# Patient Record
Sex: Female | Born: 1935 | Race: White | Hispanic: No | Marital: Married | Smoking: Former smoker
Health system: Southern US, Community
[De-identification: ages and names within clinical notes are randomized; demographics above are authoritative.]

## PROBLEM LIST (undated history)

## (undated) DIAGNOSIS — T8859XA Other complications of anesthesia, initial encounter: Secondary | ICD-10-CM

## (undated) DIAGNOSIS — I209 Angina pectoris, unspecified: Secondary | ICD-10-CM

## (undated) DIAGNOSIS — M1990 Unspecified osteoarthritis, unspecified site: Secondary | ICD-10-CM

## (undated) DIAGNOSIS — Z8719 Personal history of other diseases of the digestive system: Secondary | ICD-10-CM

## (undated) DIAGNOSIS — R011 Cardiac murmur, unspecified: Secondary | ICD-10-CM

## (undated) DIAGNOSIS — E785 Hyperlipidemia, unspecified: Secondary | ICD-10-CM

## (undated) DIAGNOSIS — Q630 Accessory kidney: Secondary | ICD-10-CM

## (undated) DIAGNOSIS — R0989 Other specified symptoms and signs involving the circulatory and respiratory systems: Secondary | ICD-10-CM

## (undated) DIAGNOSIS — T4145XA Adverse effect of unspecified anesthetic, initial encounter: Secondary | ICD-10-CM

## (undated) DIAGNOSIS — H3530 Unspecified macular degeneration: Secondary | ICD-10-CM

## (undated) DIAGNOSIS — I4891 Unspecified atrial fibrillation: Secondary | ICD-10-CM

## (undated) DIAGNOSIS — K219 Gastro-esophageal reflux disease without esophagitis: Secondary | ICD-10-CM

## (undated) DIAGNOSIS — I499 Cardiac arrhythmia, unspecified: Secondary | ICD-10-CM

## (undated) DIAGNOSIS — I10 Essential (primary) hypertension: Secondary | ICD-10-CM

## (undated) HISTORY — DX: Hyperlipidemia, unspecified: E78.5

## (undated) HISTORY — DX: Unspecified atrial fibrillation: I48.91

## (undated) HISTORY — PX: KNEE SURGERY: SHX244

## (undated) HISTORY — DX: Unspecified osteoarthritis, unspecified site: M19.90

## (undated) HISTORY — PX: CARDIOVASCULAR STRESS TEST: SHX262

## (undated) HISTORY — PX: APPENDECTOMY: SHX54

## (undated) HISTORY — PX: EYE SURGERY: SHX253

## (undated) HISTORY — DX: Unspecified macular degeneration: H35.30

## (undated) HISTORY — PX: TONSILLECTOMY: SUR1361

## (undated) HISTORY — DX: Essential (primary) hypertension: I10

## (undated) HISTORY — DX: Other specified symptoms and signs involving the circulatory and respiratory systems: R09.89

## (undated) HISTORY — DX: Gastro-esophageal reflux disease without esophagitis: K21.9

## (undated) HISTORY — PX: SPINE SURGERY: SHX786

## (undated) HISTORY — PX: CATARACT EXTRACTION W/ INTRAOCULAR LENS  IMPLANT, BILATERAL: SHX1307

## (undated) HISTORY — DX: Accessory kidney: Q63.0

## (undated) HISTORY — PX: HEMORRHOID SURGERY: SHX153

---

## 1972-01-18 HISTORY — PX: ABDOMINAL HYSTERECTOMY: SHX81

## 1997-06-18 ENCOUNTER — Other Ambulatory Visit: Admission: RE | Admit: 1997-06-18 | Discharge: 1997-06-18 | Payer: Self-pay | Admitting: *Deleted

## 1997-10-15 ENCOUNTER — Encounter: Payer: Self-pay | Admitting: Specialist

## 1997-10-15 ENCOUNTER — Observation Stay (HOSPITAL_COMMUNITY): Admission: RE | Admit: 1997-10-15 | Discharge: 1997-10-16 | Payer: Self-pay | Admitting: Specialist

## 1999-05-05 ENCOUNTER — Ambulatory Visit (HOSPITAL_COMMUNITY): Admission: RE | Admit: 1999-05-05 | Discharge: 1999-05-05 | Payer: Self-pay | Admitting: Gastroenterology

## 1999-05-05 ENCOUNTER — Encounter (INDEPENDENT_AMBULATORY_CARE_PROVIDER_SITE_OTHER): Payer: Self-pay | Admitting: *Deleted

## 2000-01-05 ENCOUNTER — Emergency Department (HOSPITAL_COMMUNITY): Admission: EM | Admit: 2000-01-05 | Discharge: 2000-01-05 | Payer: Self-pay | Admitting: Emergency Medicine

## 2000-01-05 ENCOUNTER — Encounter: Payer: Self-pay | Admitting: Internal Medicine

## 2004-06-02 ENCOUNTER — Ambulatory Visit (HOSPITAL_COMMUNITY): Admission: RE | Admit: 2004-06-02 | Discharge: 2004-06-02 | Payer: Self-pay | Admitting: Gastroenterology

## 2004-08-30 ENCOUNTER — Other Ambulatory Visit: Admission: RE | Admit: 2004-08-30 | Discharge: 2004-08-30 | Payer: Self-pay | Admitting: Family Medicine

## 2005-03-12 ENCOUNTER — Emergency Department (HOSPITAL_COMMUNITY): Admission: EM | Admit: 2005-03-12 | Discharge: 2005-03-12 | Payer: Self-pay | Admitting: Emergency Medicine

## 2005-03-25 ENCOUNTER — Encounter: Admission: RE | Admit: 2005-03-25 | Discharge: 2005-03-25 | Payer: Self-pay | Admitting: Orthopedic Surgery

## 2006-09-06 ENCOUNTER — Encounter: Admission: RE | Admit: 2006-09-06 | Discharge: 2006-09-06 | Payer: Self-pay | Admitting: Orthopedic Surgery

## 2007-01-30 ENCOUNTER — Encounter: Admission: RE | Admit: 2007-01-30 | Discharge: 2007-01-30 | Payer: Self-pay | Admitting: Specialist

## 2007-04-17 ENCOUNTER — Encounter: Admission: RE | Admit: 2007-04-17 | Discharge: 2007-05-15 | Payer: Self-pay | Admitting: Ophthalmology

## 2008-03-12 ENCOUNTER — Inpatient Hospital Stay (HOSPITAL_COMMUNITY): Admission: RE | Admit: 2008-03-12 | Discharge: 2008-03-15 | Payer: Self-pay | Admitting: Orthopedic Surgery

## 2008-03-24 ENCOUNTER — Inpatient Hospital Stay (HOSPITAL_COMMUNITY): Admission: AD | Admit: 2008-03-24 | Discharge: 2008-03-30 | Payer: Self-pay | Admitting: Orthopedic Surgery

## 2008-03-26 ENCOUNTER — Encounter (INDEPENDENT_AMBULATORY_CARE_PROVIDER_SITE_OTHER): Payer: Self-pay | Admitting: Orthopedic Surgery

## 2010-01-17 HISTORY — PX: JOINT REPLACEMENT: SHX530

## 2010-02-08 LAB — COMPREHENSIVE METABOLIC PANEL
ALT: 11 U/L (ref 0–35)
AST: 17 U/L (ref 0–37)
Albumin: 3.7 g/dL (ref 3.5–5.2)
Alkaline Phosphatase: 54 U/L (ref 39–117)
BUN: 14 mg/dL (ref 6–23)
CO2: 29 mEq/L (ref 19–32)
Calcium: 9.4 mg/dL (ref 8.4–10.5)
Chloride: 102 mEq/L (ref 96–112)
Creatinine, Ser: 0.94 mg/dL (ref 0.4–1.2)
GFR calc Af Amer: >60 mL/min (ref >60)
GFR calc non Af Amer: 57 mL/min — ABNORMAL LOW (ref >60)
Glucose, Bld: 84 mg/dL (ref 70–99)
Potassium: 4.4 mEq/L (ref 3.5–5.1)
Sodium: 139 mEq/L (ref 135–145)
Total Bilirubin: 0.7 mg/dL (ref 0.3–1.2)
Total Protein: 7 g/dL (ref 6.0–8.3)

## 2010-02-08 LAB — CBC
HCT: 40.6 % (ref 36.0–46.0)
Hemoglobin: 13.1 g/dL (ref 12.0–15.0)
MCH: 30.1 pg (ref 26.0–34.0)
MCHC: 32.3 g/dL (ref 30.0–36.0)
MCV: 93.3 fL (ref 78.0–100.0)
Platelets: 257 10*3/uL (ref 150–400)
RBC: 4.35 MIL/uL (ref 3.87–5.11)
RDW: 15 % (ref 11.5–15.5)
WBC: 6.9 10*3/uL (ref 4.0–10.5)

## 2010-02-08 LAB — PROTIME-INR
INR: 0.98 (ref 0.00–1.49)
Prothrombin Time: 13.2 seconds (ref 11.6–15.2)

## 2010-02-08 LAB — SURGICAL PCR SCREEN
MRSA, PCR: NEGATIVE
Staphylococcus aureus: NEGATIVE

## 2010-02-08 LAB — URINALYSIS, ROUTINE W REFLEX MICROSCOPIC
Bilirubin Urine: NEGATIVE
Hgb urine dipstick: NEGATIVE
Ketones, ur: NEGATIVE mg/dL
Nitrite: NEGATIVE
Protein, ur: NEGATIVE mg/dL
Specific Gravity, Urine: 1.011 (ref 1.005–1.030)
Urine Glucose, Fasting: NEGATIVE mg/dL
Urobilinogen, UA: 0.2 mg/dL (ref 0.0–1.0)
pH: 6.5 (ref 5.0–8.0)

## 2010-02-08 LAB — URINE MICROSCOPIC-ADD ON

## 2010-02-08 LAB — APTT: aPTT: 33 seconds (ref 24–37)

## 2010-02-15 ENCOUNTER — Inpatient Hospital Stay (HOSPITAL_COMMUNITY)
Admission: RE | Admit: 2010-02-15 | Discharge: 2010-02-18 | DRG: 470 | Disposition: A | Discharge status: Skilled Nursing Facility | Payer: MEDICARE | Attending: Orthopedic Surgery | Admitting: Orthopedic Surgery

## 2010-02-15 DIAGNOSIS — K449 Diaphragmatic hernia without obstruction or gangrene: Secondary | ICD-10-CM | POA: Diagnosis present

## 2010-02-15 DIAGNOSIS — F17200 Nicotine dependence, unspecified, uncomplicated: Secondary | ICD-10-CM | POA: Diagnosis present

## 2010-02-15 DIAGNOSIS — D62 Acute posthemorrhagic anemia: Secondary | ICD-10-CM | POA: Diagnosis not present

## 2010-02-15 DIAGNOSIS — G43909 Migraine, unspecified, not intractable, without status migrainosus: Secondary | ICD-10-CM | POA: Diagnosis present

## 2010-02-15 DIAGNOSIS — H3530 Unspecified macular degeneration: Secondary | ICD-10-CM | POA: Diagnosis present

## 2010-02-15 DIAGNOSIS — I10 Essential (primary) hypertension: Secondary | ICD-10-CM | POA: Diagnosis present

## 2010-02-15 DIAGNOSIS — H539 Unspecified visual disturbance: Secondary | ICD-10-CM | POA: Diagnosis present

## 2010-02-15 DIAGNOSIS — IMO0002 Reserved for concepts with insufficient information to code with codable children: Secondary | ICD-10-CM | POA: Diagnosis present

## 2010-02-15 DIAGNOSIS — E785 Hyperlipidemia, unspecified: Secondary | ICD-10-CM | POA: Diagnosis present

## 2010-02-15 DIAGNOSIS — M1710 Unilateral primary osteoarthritis, unspecified knee: Principal | ICD-10-CM | POA: Diagnosis present

## 2010-02-15 DIAGNOSIS — E876 Hypokalemia: Secondary | ICD-10-CM | POA: Diagnosis not present

## 2010-02-15 LAB — TYPE AND SCREEN
ABO/RH(D): O POS
Antibody Screen: NEGATIVE

## 2010-02-15 LAB — ABO/RH: ABO/RH(D): O POS

## 2010-02-16 LAB — BASIC METABOLIC PANEL
BUN: 11 mg/dL (ref 6–23)
CO2: 30 mEq/L (ref 19–32)
Calcium: 8.3 mg/dL — ABNORMAL LOW (ref 8.4–10.5)
Chloride: 103 mEq/L (ref 96–112)
Creatinine, Ser: 0.88 mg/dL (ref 0.4–1.2)
GFR calc Af Amer: >60 mL/min (ref >60)
GFR calc non Af Amer: >60 mL/min (ref >60)
Glucose, Bld: 130 mg/dL — ABNORMAL HIGH (ref 70–99)
Potassium: 4.4 mEq/L (ref 3.5–5.1)
Sodium: 138 mEq/L (ref 135–145)

## 2010-02-16 LAB — CBC
HCT: 30.4 % — ABNORMAL LOW (ref 36.0–46.0)
Hemoglobin: 9.8 g/dL — ABNORMAL LOW (ref 12.0–15.0)
MCH: 30.2 pg (ref 26.0–34.0)
MCHC: 32.2 g/dL (ref 30.0–36.0)
MCV: 93.8 fL (ref 78.0–100.0)
Platelets: 199 10*3/uL (ref 150–400)
RBC: 3.24 MIL/uL — ABNORMAL LOW (ref 3.87–5.11)
RDW: 14.9 % (ref 11.5–15.5)
WBC: 7 10*3/uL (ref 4.0–10.5)

## 2010-02-16 LAB — PROTIME-INR
INR: 1.13 (ref 0.00–1.49)
Prothrombin Time: 14.7 seconds (ref 11.6–15.2)

## 2010-02-16 NOTE — H&P (Addendum)
Vicki Hernandez, Vicki Hernandez            ACCOUNT NO.:  [id number]  MEDICAL RECORD NO.:  [id number]          PATIENT TYPE:  INP  LOCATION:  0099                         FACILITY:  [facility]  PHYSICIAN:  Ollen Gross, M.D.    DATE OF BIRTH:  06/08/1928  DATE OF ADMISSION:  02/15/2010 DATE OF DISCHARGE:                             HISTORY & PHYSICAL   CHIEF COMPLAINT:  Left knee pain.  BRIEF HISTORY:  Vicki Hernandez was referred to Dr. Lequita Halt by Dr. Shon Baton. She has been having trouble with the left knee for several years now and it has gotten much worse over the past year.  She has been seeing her rheumatologist, Dr. Corliss Skains and getting cortisone injections. Unfortunately, these injections are helping less and less.  She now is feeling as though the knee is going to give out on her.  It is decreasing her ability to complete daily activities.  It is limiting what she is able to do.  She now presents for left total knee arthroplasty.  Vicki Hernandez has been cleared for surgery by her primary care physician, Dr. Ancil Boozer.  MEDICATION ALLERGIES: 1. SULFA.  This causes hives. 2. CODEINE, more of a sensitivity.  This causes nausea.  She is     however able to tolerate Percocet and Vicodin.  She denies any allergies to food, latex or metal.  CURRENT MEDICATIONS: 1. Micardis. 2. Nexium. 3. Simvastatin. 4. Vitamin D. 5. Senna 6. Stool softener. 7. Tramadol.  PAST MEDICAL HISTORY: 1. End-stage arthritis of the left knee. 2. History of migraines, has not had one in several years. 3. Impaired vision. 4. Macular degeneration. 5. Cataracts. 6. Hypertension. 7. Heart murmur. 8. Hyperlipidemia. 9. Heart arrhythmia. 10.Hiatal hernia. 11.Hemorrhoids, denies blood in the stool. 12.History of fractures. 13.Arthritis. 14.Degenerative disk disease. 15.History of measles as a child. 16.History of mumps as a child. 17.History of rubella as a child.  PAST SURGICAL HISTORY: 1. Back surgery in  2011. 2. Tonsillectomy in 1936. 3. Eye surgery in 1938. 4. Appendectomy in 1941. 5. Complete hysterectomy in 1974. 6. Three breast cysts removed, unsure of the year. 7. Hemorrhoidectomy x3. 8. Cataract surgery x2.  FAMILY MEDICAL HISTORY:  Father passed at the age of 60.  He had coronary artery disease.  Mother passed at the age of 49 from a stroke.  SOCIAL HISTORY:  The patient is married.  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  She admits current use of tobacco products.  She has smoked half a pack a day for many years.  She admits occasional glass of wine.  She has two children, lives at home with her husband and she does plan to go home following her hospital stay.  Her daughter and her husband are lined up to be her caregiver.  REVIEW OF SYSTEMS:  GENERAL:  Positive for weight change.  Negative for night sweats or loss of memory.  HEENT/NEURO:  Positive for blurred vision.  Negative for insomnia or balance problems.  DERMATOLOGIC: Positive for occasional hives.  Negative for itching or lesion. RESPIRATORY:  Negative for shortness of breath at rest or with exertion. CARDIOVASCULAR:  Positive for occasional  palpitations.  Negative for chest pain.  GI:  Positive for constipation.  Negative for nausea, vomiting, diarrhea.  GU:  Negative for hematuria, dysuria. MUSCULOSKELETAL:  Positive for joint pain, joint swelling, back pain and morning stiffness.  PHYSICAL EXAMINATION:  VITAL SIGNS:  Pulse 88, respirations 18, blood pressure 118/76 in the left arm. GENERAL:  Ms. Speak is alert and oriented x3.  She is well developed, well nourished, in no apparent distress.  She is accompanied today by her husband. HEENT:  Normocephalic, atraumatic.  Extraocular movements intact.  The patient is wearing glasses. NECK:  Supple.  Full range of motion without lymphadenopathy. CHEST:  Lungs are clear to auscultation bilaterally without wheezes, rhonchi or rales. HEART:  Regular  rate and rhythm.  She does have a systolic murmur. ABDOMEN:  Bowel sounds present in all four quadrants.  Abdomen is soft, nontender, nondistended to palpation. EXTREMITIES:  Left knee 5 to 125 degrees.  Marked crepitus noted throughout the range of motion.  She has tenderness with palpation over the medial aspect of the left knee. SKIN:  She has a skin tear of the medial aspect of the right lower leg. No signs of infection, however. NEUROLOGIC:  Intact peripheral vascular, mild carotid bruit noted on the left.  RADIOGRAPHS:  AP and lateral views of the left knee reveal end-stage arthritis in the medial and patellofemoral compartments.  IMPRESSION:  End-stage arthritis of the left knee.  PLAN:  Left total knee arthroplasty to be performed by Dr. Lequita Halt.     Rozell Searing, PAC   ______________________________ Ollen Gross, M.D.    LD/MEDQ  D:  02/15/2010  T:  02/15/2010  Job:  [id number]  cc:   Dr. Ancil Boozer  Electronically Signed by Rozell Searing  on 02/16/2010 93:81:82 PM Electronically Signed by Ollen Gross M.D. on 02/17/2010 03:40:53 PM

## 2010-02-17 LAB — CBC
HCT: 28.1 % — ABNORMAL LOW (ref 36.0–46.0)
Hemoglobin: 9.4 g/dL — ABNORMAL LOW (ref 12.0–15.0)
MCH: 31 pg (ref 26.0–34.0)
MCHC: 33.5 g/dL (ref 30.0–36.0)
MCV: 92.7 fL (ref 78.0–100.0)
Platelets: 167 10*3/uL (ref 150–400)
RBC: 3.03 MIL/uL — ABNORMAL LOW (ref 3.87–5.11)
RDW: 14.8 % (ref 11.5–15.5)
WBC: 9.1 10*3/uL (ref 4.0–10.5)

## 2010-02-17 LAB — PROTIME-INR
INR: 1.43 (ref 0.00–1.49)
Prothrombin Time: 17.6 seconds — ABNORMAL HIGH (ref 11.6–15.2)

## 2010-02-17 LAB — BASIC METABOLIC PANEL
BUN: 7 mg/dL (ref 6–23)
CO2: 28 mEq/L (ref 19–32)
Calcium: 8.6 mg/dL (ref 8.4–10.5)
Chloride: 104 mEq/L (ref 96–112)
Creatinine, Ser: 0.9 mg/dL (ref 0.4–1.2)
GFR calc Af Amer: >60 mL/min (ref >60)
GFR calc non Af Amer: >60 mL/min (ref >60)
Glucose, Bld: 158 mg/dL — ABNORMAL HIGH (ref 70–99)
Potassium: 3.4 mEq/L — ABNORMAL LOW (ref 3.5–5.1)
Sodium: 139 mEq/L (ref 135–145)

## 2010-02-17 NOTE — Op Note (Signed)
NAMEDARNETTE, LAMPRON            ACCOUNT NO.:  [id number]  MEDICAL RECORD NO.:  [id number]          PATIENT TYPE:  INP  LOCATION:  0099                         FACILITY:  [facility]  PHYSICIAN:  Ollen Gross, M.D.    DATE OF BIRTH:  May 28, 1928  DATE OF PROCEDURE:  02/15/2010 DATE OF DISCHARGE:                              OPERATIVE REPORT   PREOPERATIVE DIAGNOSIS:  Osteoarthritis of the left knee.  POSTOPERATIVE DIAGNOSIS:  Osteoarthritis of the left knee.  PROCEDURE:  Left total knee arthroplasty.  SURGEON:  Ollen Gross, M.D.  ASSISTANT:  [name], P.A.C.  ANESTHESIA:  General.  ESTIMATED BLOOD LOSS:  Minimal.  DRAINS:  Hemovac x1.  TOURNIQUET TIME:  29 minutes at 300 mmHg.  COMPLICATIONS:  None.  CONDITION:  Stable to recovery.  CLINICAL NOTE:  Ms. Glassberg is a 77 year old man female, who has advanced arthritis of the left knee with progressively worsening pain and dysfunction.  She has failed nonoperative management and presents now for a left total knee arthroplasty.  PROCEDURE IN DETAIL:  After successful administration of general anesthetic, a tourniquet is placed on her left thigh and the left lower extremity was prepped and draped in the usual sterile fashion. Extremities were wrapped in Esmarch, knee flexed, tourniquet inflated to 300 mmHg.  Midline incision was made with 10 blade through subcutaneous tissue to the level of extensor mechanism.  Fresh blade was used to make a medial parapatellar arthrotomy.  Soft tissue on the proximal medial tibia was subperiosteally elevated to the joint line with the knife into the semimembranosus bursa with a Cobb elevator.  Soft tissue laterally was elevated with attention being paid to avoiding the patellar tendon on tibial tubercle.  Patella was everted and knee flexed to 90 degrees, ACL and PCL removed.  Drill was used to create a starting hole in the distal femur.  The canal was thoroughly irrigated.   Five degrees left valgus alignment guide is placed.  The distal femoral cutting block is pinned to remove 10 mm off the distal femur.  Distal femoral resection was made with an oscillating saw.  The tibia subluxed forward and the menisci removed.  Extramedullary tibial alignment guide is placed referencing proximally at the medial aspect of the tibial tubercle and distally along the second metatarsal axis and tibial crest.  Block is pinned to remove 2 mm off the more deficient medial side.  Tibial resection is made with an oscillating saw.  Size 2.5 is most appropriate and a proximal tibia was prepared with modular drill and keel punched for the size 2.5.  Femoral sizing guide was placed and size 2.5 was most appropriate on the femur.  Rotations were marked at the epicondylar axis confirmed by creating rectangular flexion gap at 90 degrees.  Size 2.5 cutting block is placed in this rotation and the anterior, posterior and chamfer cuts were made.  Intercondylar block was placed and that cut was made.  Trial 2.5 posterior stabilized femur was placed.  The 10 mm posterior stabilized rotating platform insert trial was placed.  There was a little bit of play with the tensor when the 12 fibers were  allowed for full extension with excellent varus-valgus and anterior-posterior balance throughout with full range of motion.  Patella was everted and the thickness measured to be 23 mm.  Freehand resection was taken to 14 mm, 35 template was placed, lug holes were drilled, trial patella was placed and it tracks normally.  Osteophytes were removed off the posterior femur with the trial in place.  All trials were removed and cut bone surfaces, pulsatile lavaged, cements mixed and once ready for implantation, the size 2.5 mobile bearing tibia, 2.5 posterior stabilized femur and 35 patella are cemented into place.  Patella was held with the clamp.  Trial 12.5 inserts were placed and the knee was held  in full extension.  All extruded cement removed.  When cement fully hardened and permanent, 12.5 mm posterior stabilized rotating platform insert was placed in tibial tray and was copiously irrigated with saline solution and the arthrotomy closed over with Hemovac drain with interrupted #1 PDS.  Flexion against gravity to 135 degrees.  Patella tracks normally.  The tourniquet was released with total time of 29 minutes.  Subcutaneous was closed with interrupted 2-0 Vicryl subcuticular running 4-0 Monocryl.  Incisions were cleaned and dried and Steri-Strips and bulky sterile dressing were applied.  Drains hooked to suction.  Prior placing the dressing, the catheter for Marcaine pain pump was placed and the pump was initiated.  She was then placed into a knee immobilizer, awakened and transferred to recovery in stable condition.     Ollen Gross, M.D.     FA/MEDQ  D:  02/15/2010  T:  02/15/2010  Job:  [id number]  Electronically Signed by Ollen Gross M.D. on 02/17/2010 03:40:59 PM

## 2010-02-18 LAB — BASIC METABOLIC PANEL
BUN: 10 mg/dL (ref 6–23)
CO2: 29 mEq/L (ref 19–32)
Calcium: 8.9 mg/dL (ref 8.4–10.5)
Chloride: 105 mEq/L (ref 96–112)
Creatinine, Ser: 1.04 mg/dL (ref 0.4–1.2)
GFR calc Af Amer: >60 mL/min (ref >60)
GFR calc non Af Amer: 51 mL/min — ABNORMAL LOW (ref >60)
Glucose, Bld: 109 mg/dL — ABNORMAL HIGH (ref 70–99)
Potassium: 4.5 mEq/L (ref 3.5–5.1)
Sodium: 140 mEq/L (ref 135–145)

## 2010-02-18 LAB — CBC
HCT: 28 % — ABNORMAL LOW (ref 36.0–46.0)
Hemoglobin: 9 g/dL — ABNORMAL LOW (ref 12.0–15.0)
MCH: 29.9 pg (ref 26.0–34.0)
MCHC: 32.1 g/dL (ref 30.0–36.0)
MCV: 93 fL (ref 78.0–100.0)
Platelets: 187 10*3/uL (ref 150–400)
RBC: 3.01 MIL/uL — ABNORMAL LOW (ref 3.87–5.11)
RDW: 14.8 % (ref 11.5–15.5)
WBC: 7.2 10*3/uL (ref 4.0–10.5)

## 2010-02-18 LAB — PROTIME-INR
INR: 1.68 — ABNORMAL HIGH (ref 0.00–1.49)
Prothrombin Time: 20 seconds — ABNORMAL HIGH (ref 11.6–15.2)

## 2010-03-25 NOTE — Discharge Summary (Signed)
Vicki Hernandez, Vicki Hernandez            ACCOUNT NO.:  [id number]  MEDICAL RECORD NO.:  [id number]          Hernandez TYPE:  INP  LOCATION:  1620                         FACILITY:  [facility]  PHYSICIAN:  Ollen Gross, M.D.    DATE OF BIRTH:  [birth year]  DATE OF ADMISSION:  02/15/2010 DATE OF DISCHARGE:  02/18/2010                        DISCHARGE SUMMARY - REFERRING   PRIMARY CARE PHYSICIAN:  [name], MD  Priority summary will go with Vicki Hernandez over to [facility] today.  ADMITTING DIAGNOSES: 1. Osteoarthritis of left knee. 2. History of migraines. 3. Impaired vision 4. Macular degeneration. 5. History of cataracts. 6. Hypertension. 7. Heart murmur. 8. Hyperlipidemia. 9. Heart arrhythmia. 10.Hiatal hernia. 11.Hemorrhoids. 12.Degenerative disk disease. 13.Childhood illnesses of measles, mumps, rubella.  DISCHARGE DIAGNOSES: 1. Osteoarthritis of left knee, status post left total knee     replacement arthroplasty. 2. Mild postoperative hypokalemia, improved. 3. Postoperative acute blood loss anemia, did not require transfusion. 4. History of migraines. 5. Impaired vision 6. Macular degeneration. 7. History of cataracts. 8. Hypertension. 9. Heart murmur. 10.Hyperlipidemia. 11.Heart arrhythmia. 12.Hiatal hernia. 13.Hemorrhoids. 14.Degenerative disk disease. 15.Childhood illnesses of measles, mumps, rubella.  PROCEDURE:  February 15, 2010, left total knee.  SURGEON:  Ollen Gross, M.D.  ASSISTANT:  [name], P.A.C.  ANESTHESIA:  General.  TOURNIQUET TIME:  29 minutes.  CONSULTS:  None.  BRIEF HISTORY:  Vicki Hernandez is a 77 year old female who has been seen by Dr. Lequita Halt for ongoing progressive end-stage arthritis.  She has undergone injections in Vicki past, failed nonoperative management, and now presents for total knee arthroplasty.  LABORATORY DATA:  CBC on admission; hemoglobin of 13.1, hematocrit of 40.6, white cell count 6.9, platelets  257,000.  PT/INR 13.2/0.98, with PTT of 33.  Chem panel on admission, all within normal limits. Preoperative UA; trace leukocytes, many squamous, 0 to 3 white cells, otherwise negative.  Blood group type O positive.  Nasal swabs were negative for Staphylococcus aureus, negative for MRSA.  Serial CBCs were followed.  Hemoglobin dropped down to 9.8 and 9.4, last hemoglobin and hematocrit was 9.0 and 28.0.  Serial protimes were followed per Coumadin protocol, last PT/INR of 20.0 and 1.68.  Serial BMPs were followed for 3 days.  Potassium dropped from 4.4 to 3.4, back up to 4.5; glucose went up from 84 to 158, back down to 109.  Remaining electrolytes remained within normal limits.  X-rays, two-view chest, on February 05, 2010, stable COPD, no active cardiopulmonary disease.  EKG on February 05, 2010, sinus rhythm with marked sinus arrhythmia, possible left atrial enlargement, abnormal, cannot rule out anterior septal infarct, confirmed by Dr. Armanda Magic.  HOSPITAL COURSE:  Vicki Hernandez was admitted to [facility], taken to Vicki OR, underwent above-stated procedure without complication. Vicki Hernandez tolerated Vicki procedure well, later was transferred to recovery room and floor, started on p.o. and IV analgesics for pain control following Vicki surgery.  Did have some pain on Vicki evening of surgery and Vicki morning of day #1.  She was seen in rounds.  Started back on her home medications.  Hemoglobin was down to 9.8.  She was asymptomatic with this.  She had decent urinary output.  We started back her blood pressure pill on a parameter.  Started getting up out of bed on day #1, actually walked short distances when in Vicki room and short distances in Vicki hallway by day #2; however, she was doing very well, walking over 200 feet.  Pain was under better control.  Hemoglobin was down to 9.4.  Pressure was stable.  Dressings changed, incision looked good.  Put her on a little iron supplement  for Vicki low iron.  Her potassium was down to 3.4, felt to be a dilutional component, so put on a little K-Dur.  She tolerated and responded well by Vicki next day.  Her potassium was back up to 4.5.  She was walking well, progressing, meeting her goals with therapy, seen by Dr. Lequita Halt and decided she be transferred out at that time.  Postoperatively, we got social work involved.  She wanted to look into [facility], bed available and transferred on February 18, 2010.  DISCHARGE/PLAN: 1. Vicki Hernandez will be transferred to [facility] on February 18, 2010. 2. Discharge diagnoses, please see above.  DISCHARGE MEDICATIONS:  As follows: 1. Coumadin protocol.  Please titrate Coumadin level for target INR     between 2.0 and 3.0.  She needs to be on Coumadin for 3 weeks from     Vicki date of surgery. 2. Micardis HCT 40/12.5 one-half tablet every morning. 3. Colace 100 mg p.o. daily. 4. Simvastatin 40 mg p.o. q.a.m. 5. Nexium 40 mg p.o. q.a.m. 6. Tylenol 325 one or two every 4-6 hours as needed for mild pain,     temperature or headache. 7. Robaxin 500 mg p.o. every 6-8 hours p.r.n. spasm. 8. Systane eyedrops to both eyes 1 drop 3 times a day. 9. Percocet 5 mg 1 or 2 every 4-6 hours as needed for moderate pain. 10.Ultram 50 mg 1 or 2 tablets every 6 hours as needed for mild pain.  DIET:  Heart-healthy diet.  ACTIVITY:  She is weightbearing as tolerated, total knee protocol. PT/OT for gait training, ambulation, ADLs, range of motion and strengthening exercises per total knee protocol.  Please note, she may start showering once she is transferred to [facility], however, do not submerge incision under water.  Daily dressing change.  FOLLOWUP:  Vicki Hernandez needs to follow with Dr. Lequita Halt in Vicki office either on Tuesday, Vicki 14th, or Thursday, Vicki 16th.  Please contact Vicki office at [phone] to help arrange appointment and followup care of this Hernandez.  DISPOSITION:  Vicki Hernandez.  CONDITION ON DISCHARGE:  Improved.     [name], P.A.C.   ______________________________ Ollen Gross, M.D.    ALP/MEDQ  D:  02/18/2010  T:  02/18/2010  Job:  [id number]  cc:   [name], MD  Electronically Signed by Patrica Duel P.A.C. on 03/22/2010 10:46:22 AM Electronically Signed by Ollen Gross M.D. on 03/24/2010 03:45:02 PM

## 2010-04-29 LAB — CBC
HCT: 32.2 % — ABNORMAL LOW (ref 36.0–46.0)
HCT: 37.7 % (ref 36.0–46.0)
Hemoglobin: 10.7 g/dL — ABNORMAL LOW (ref 12.0–15.0)
Hemoglobin: 12.7 g/dL (ref 12.0–15.0)
MCHC: 33.2 g/dL (ref 30.0–36.0)
MCHC: 33.7 g/dL (ref 30.0–36.0)
MCV: 92.6 fL (ref 78.0–100.0)
MCV: 94 fL (ref 78.0–100.0)
Platelets: 259 10*3/uL (ref 150–400)
Platelets: 315 10*3/uL (ref 150–400)
RBC: 3.43 MIL/uL — ABNORMAL LOW (ref 3.87–5.11)
RBC: 4.07 MIL/uL (ref 3.87–5.11)
RDW: 14.5 % (ref 11.5–15.5)
RDW: 14.6 % (ref 11.5–15.5)
WBC: 8.3 10*3/uL (ref 4.0–10.5)
WBC: 8.4 10*3/uL (ref 4.0–10.5)

## 2010-04-29 LAB — BASIC METABOLIC PANEL
BUN: 11 mg/dL (ref 6–23)
BUN: 13 mg/dL (ref 6–23)
CO2: 29 mEq/L (ref 19–32)
CO2: 33 mEq/L — ABNORMAL HIGH (ref 19–32)
Calcium: 8.8 mg/dL (ref 8.4–10.5)
Calcium: 9.7 mg/dL (ref 8.4–10.5)
Chloride: 100 mEq/L (ref 96–112)
Chloride: 101 mEq/L (ref 96–112)
Creatinine, Ser: 0.87 mg/dL (ref 0.4–1.2)
Creatinine, Ser: 0.87 mg/dL (ref 0.4–1.2)
GFR calc Af Amer: >60 mL/min (ref >60)
GFR calc Af Amer: >60 mL/min (ref >60)
GFR calc non Af Amer: >60 mL/min (ref >60)
GFR calc non Af Amer: >60 mL/min (ref >60)
Glucose, Bld: 111 mg/dL — ABNORMAL HIGH (ref 70–99)
Glucose, Bld: 122 mg/dL — ABNORMAL HIGH (ref 70–99)
Potassium: 3.9 mEq/L (ref 3.5–5.1)
Potassium: 4.4 mEq/L (ref 3.5–5.1)
Sodium: 137 mEq/L (ref 135–145)
Sodium: 139 mEq/L (ref 135–145)

## 2010-04-29 LAB — URINALYSIS, ROUTINE W REFLEX MICROSCOPIC
Bilirubin Urine: NEGATIVE
Glucose, UA: NEGATIVE mg/dL
Hgb urine dipstick: NEGATIVE
Ketones, ur: NEGATIVE mg/dL
Nitrite: NEGATIVE
Protein, ur: NEGATIVE mg/dL
Specific Gravity, Urine: 1.006 (ref 1.005–1.030)
Urobilinogen, UA: 0.2 mg/dL (ref 0.0–1.0)
pH: 7.5 (ref 5.0–8.0)

## 2010-05-04 LAB — POCT I-STAT, CHEM 8
BUN: 25 mg/dL — ABNORMAL HIGH (ref 6–23)
Calcium, Ion: 1.24 mmol/L (ref 1.12–1.32)
Chloride: 99 mEq/L (ref 96–112)
Creatinine, Ser: 1.1 mg/dL (ref 0.4–1.2)
Glucose, Bld: 89 mg/dL (ref 70–99)
HCT: 37 % (ref 36.0–46.0)
Hemoglobin: 12.6 g/dL (ref 12.0–15.0)
Potassium: 4.4 mEq/L (ref 3.5–5.1)
Sodium: 139 mEq/L (ref 135–145)
TCO2: 36 mmol/L (ref 0–100)

## 2010-06-01 NOTE — Op Note (Signed)
NAMECARMON, BRIGANDI            ACCOUNT NO.:  [id number]   MEDICAL RECORD NO.:  [id number]          PATIENT TYPE:  INP   LOCATION:  1605                         FACILITY[facility]   PHYSICIAN:  Alvy Beal, MD    DATE OF BIRTH:  07-18-1928   DATE OF PROCEDURE:  03/13/2008  DATE OF DISCHARGE:                               OPERATIVE REPORT   PREOPERATIVE DIAGNOSIS:  Left lateral disk herniation causing foraminal  and lateral recess stenosis, L2-3.   POSTOPERATIVE DIAGNOSIS:  Left lateral disk herniation causing foraminal  and lateral recess stenosis, L2-3.   OPERATIVE PROCEDURE:  L2-3 lumbar decompression diskectomy.   COMPLICATIONS:  None.   CONDITION:  Stable.   HISTORY:  This is a very pleasant 77 year old woman who presented to my  office with complaints of severe left L3 and L2 radicular leg pain.  Attempts at conservative management including narcotic pain medications  had failed.  After discussing various treatment options,she elected to  proceed with surgery.   OPERATIVE NOTE:  The patient was brought to the operating room, placed  supine on the operating table.  After successful induction of general  anesthesia endotracheal intubation, TED SCDs were applied and she was  turned prone onto the Wilson frame.  All appropriate bony prominences  were well-padded and the Wilson frame was positioned into kyphotic  position so that the body was in kyphosis.  The back was then prepped  and draped in standard fashion.  A needle was then inserted at the L2-3  level and intraoperative lateral fluoroscopy confirmed that I was at the  L2-3 level.  I then made a small incision about an inch in length and  just off of midline.  I then dissected down to the deep fascia.  The  deep fascia and then passed the various trocars until I was just over  the L2-3 disk space.  I then placed a self retracting tube from [employer]  (maximum access minimally invasive system).  I then locked it to  the  table with the arm and opened it.  At this point I had excellent  visualization of the L2 lamina and L2 pars.  I then developed a plane.  I then confirmed the L2 lamina on x-ray and then used a fine curette to  develop a plane underneath the L2 lamina.  I then performed a generous  laminotomy.  Because of the lateral recess and foraminal extension of  the disk material,  I elected to resect the pars in order to adequately  decompress the exiting L2 nerve root.  Once I had the lateral recess  decompression.  I could clearly visualize the L2-3 disk space.  There  was a fragment of disk material underneath in the L2 foramen and having  resected the pars, I was able to easily remove this fragment.  I then  went to the L2-3 disk space itself, incised the annulus and remove the  rather large posterolateral disk fragment.  There was also significant  osteophytes which I used a reverse angled Epstein curette to remove.  Once I had an adequate diskectomy done,  I was able to freely pass a  [employer] elevator inferiorly along the path of the L3 nerve root and the  lateral recess completely freely out the L2 neural foramen and  superiorly in the lateral recess superior to the L2 nerve root.  I was  also able to sweep it circumferentially along the medial side without  any obstruction.  I then used bipolar electrocautery to obtain  hemostasis and used FloSeal to maintain it.  Once I had adequate  decompression, I confirmed this with palpation with both the nerve hook  and a Public house manager, I removed checked the interbody disk space at L2-  3 with a micro curette to ensure that there was no loose fragments of  disk material.  Once confirmed, I irrigated copiously with normal saline  and then placed a thrombin Gelfoam soaked patty over the lamina defect  site.  I then closed the fascia with interrupted #1 Vicryl sutures,  superficial 2-0 Vicryl sutures and 3-0 nylon for the skin.  Steri-  Strips,  dry dressing were applied.  The patient was extubated and  transferred to PACU without incident.  At the end of the case, all  needle and sponge counts were correct.  The patient tolerated procedure  well.  No adverse intraoperative complications.      Alvy Beal, MD  Electronically Signed     DDB/MEDQ  D:  03/13/2008  T:  03/14/2008  Job:  [id number]

## 2010-06-01 NOTE — Op Note (Signed)
NAMEAIDEE, LATIMORE            ACCOUNT NO.:  [id number]   MEDICAL RECORD NO.:  [id number]          PATIENT TYPE:  INP   LOCATION:  1604                         FACILITY:  [facility]   PHYSICIAN:  Alvy Beal, MD    DATE OF BIRTH:  03/15/1928   DATE OF PROCEDURE:  DATE OF DISCHARGE:                               OPERATIVE REPORT   PREOPERATIVE DIAGNOSIS:  Recurrent disk herniation/hematoma from  previous left L2-3 diskectomy.   POSTOPERATIVE DIAGNOSIS:  Recurrent disk herniation/hematoma from  previous left L2-3 diskectomy.   OPERATIVE PROCEDURE:  Revision of lumbar diskectomy and excision and  evacuation of hematoma.   COMPLICATIONS:  None.   CONDITION:  Stable.   HISTORY:  This is a very pleasant 77 year old who 2 weeks ago underwent  a left L2-3 lumbar laminectomy for diskectomy for radicular L3 nerve  pain and disk herniation.  The patient's postoperative course was  essentially unremarkable.  She was ultimately discharged and the next  day following discharge, had significant the recurrent thigh pain.  This  is the same pain she had preoperatively.  Attempts at conservative  management consisting of pain medications and Medrol Dosepak have failed  to alleviate her symptoms.  Approximately Monday 2 weeks postoperative,  she was readmitted to the hospital for repeat MRI.  She was placed on  the appropriate pain medications and was made comfortable.  The repeat  MRI showed hematoma and recurrent disk herniation.  A second opinion was  requested by the family and provided.  This was done by Dr. Danielle Dess.  My  plan was to do a revision decompression and diskectomy and if required,  instrumented fusion.  I spoke with Dr. Danielle Dess directly and he concurred  with my plan.  At this point the family elected to proceed with surgery  and all appropriate risks, benefits and alternatives were discussed.   OPERATIVE NOTE:  The patient was brought to the operating room, placed  supine on  the operating table.  After successful induction of general  anesthesia and endotracheal intubation, TEDs, SCDs and Foley were  placed.  She was turned prone onto a Wilson frame.  All bony prominences  were well-padded and the back was prepped and draped in standard  fashion.  The previous lumbar laminotomy incision was excised and  slightly extended in a cranial fashion.  Sharp dissection was carried  out down to the deep fascia and I removed the previous sutures.  I then  reincised the lamina and bluntly dissected down to the L1-2 facet  complex.  I was able to palpate the remaining portion of the L2 spinous  process and lamina and I identified the previous laminotomy site.  At  this point, I placed a Penfield 4 underneath the remaining L2 lamina and  took an intraoperative x-ray.  I confirmed that I was at the L2 lamina  and then I developed a plane underneath the lamina of protecting the  thecal sac.  I then resected the entire remaining portion of the L2  lamina on that left-hand side.  Once I had an adequate exposure in a  cranial  fashion I then began dissecting down the lateral gutter.  I  removed the scar material that had begun to adhere to the thecal sac.  At this point I could clearly identified hematoma which I easily  evacuated.  There were significantly large distended epidural veins  along the lateral gutter which I coagulated using bipolar  electrocautery.  I then identified a free fragment disk material that  had traveled superiorly behind the L2 vertebral body.  I isolated this  with a nerve root and using a micro pituitary rongeur removed it.  I did  send this fragment for further pathological evaluation.  Once I had that  free fragment removed, I then proceeded down to the L2-3 disk space.  I  expanded the annulotomy and began removing other free fragments of disk  material from the intervertebral body space.  I took a second x-ray with  Penfield 4 in the L2-3 disk  space, again confirming that I was at the  appropriate level.  At this point in time, I contacted partner Dr.  Shelle Iron, who was gracious enough to scrub in and just to concur that I had  removed all the free fragments.  Just as my exam found, he was able to  freely pass the [facility] elevator inferiorly, superiorly and medially in  a circumferential fashion and laterally.  I was able to palpate up to  just beyond the L2 pedicle towards the L1 neural foramen.  I was able to  palpate medially behind the entire vertebral bodies L2 down to the L2-3  disk space and inferiorly.  I was able palpate down along the lateral  recess and towards the L3 neural foramen and I could freely palpate out  the L2 neural foramen.  There was no free fragment of disk material.  There was no significant residual neural tension.  At this point I  irrigated the wound copiously with saline and waited to ensure that I  had adequate hemostasis.  I again evaluated the distended epidural veins  and coagulated any residual bleeding with bipolar electrocautery.  I  irrigated copiously again with normal saline and then placed a Thrombin  Gelfoam soaked patty over the laminotomy site.  At this point I had  adequately decompressed the neural elements and removed the free  fragments of recurrent disk herniation and checked to ensure there was  no undue tension in the operative site.  Because of the previous  hematoma, I did place a drain and then I closed the deep fascia with  interrupted #1 Vicryl sutures, superficial with 2-0 Vicryl sutures and  the skin with 3-0 Monocryl.  Steri-Strips, dry dressing were applied.  The patient was extubated and transferred to PACU without incident.  At  the end of the case all needle, sponge counts were correct.  The patient  was tolerated the procedure well with no intraoperative complications.      Alvy Beal, MD  Electronically Signed     DDB/MEDQ  D:  03/26/2008  T:  03/27/2008   Job:  [id number]

## 2010-06-01 NOTE — Consult Note (Signed)
NAMEDANNELL, RACZKOWSKI            ACCOUNT NO.:  [id number]   MEDICAL RECORD NO.:  [id number]          PATIENT TYPE:  INP   LOCATION:  1604                         FACILITY:  [facility]   PHYSICIAN:  Stefani Dama, M.D.  DATE OF BIRTH:  1928/07/04   DATE OF CONSULTATION:  03/25/2008  DATE OF DISCHARGE:                                 CONSULTATION   REQUESTING PHYSICIAN:  Alvy Beal, MD   REASON FOR REQUEST:  Second opinion regarding recurrent herniated  nucleus pulposus.   HISTORY OF PRESENT ILLNESS:  Ms. Vicki Hernandez is a 77 year old  individual who underwent a microdiskectomy at L2-L3, for a foraminal  disk herniation at that level.  She was discharged home approximately 2  days later and seemed to be doing well; however, on the first day after  her discharge home, she recurred the severe pain in the buttock and left  lower extremity.  That following Monday, she was hospitalized and she  has remained so for the past week.  An MRI demonstrated the presence of  recurrent disk herniation in addition to some minor epidural bleeding.  She was advised regarding re-exploration; however, the family would like  to request a second opinion regarding surgical intervention.  On  reviewing her chart, I note that her past medical history reveals that  she has been fairly healthy.  She takes some Vytorin, Ocuvite, Nexium,  Micardis, and Lyrica.  Lyrica has been started for the pain itself.  She  reports no significant medical problems.  She did have a pelvic fracture  from a fall about 2 years ago that healed with bedrest.   On her physical exam, I note that her motor strength is good in  iliopsoas, quad, tibialis anterior and gastrocs.  She ambulates without  assistance though she has been using a walker in the hospital.  Review  of her MRI demonstrates that she has evidence of recurrent disk  herniation at L2-L3 on the left side.  I indicated the problem to the  patient.  I noted that  this is an unfortunate situation that can occur  with disk surgery.  I believe that she should do well with re-  decompression of this area.  The patient and her daughter are concerned  about the possibility of the need for stabilization and indeed this is a  concern.  If the patient does have an unstable facet joint or the facet  joint is fractured away, then she may indeed need some pedicle screws  and fusion; however, hopefully, the patient would do reasonably well  with simple decompression.  The possibility that she could recur the  disk again needs to be addressed also.  I indicated that there are no  guarantees with surgical intervention.  I reassured the patient and her  daughter that she is in good and competent care and hopefully should do  well with further surgical decompression.  I will remain available for  reevaluation should the need arise.      Stefani Dama, M.D.  Electronically Signed     HJE/MEDQ  D:  03/25/2008  T:  03/26/2008  Job:  [id number]

## 2010-06-04 NOTE — Op Note (Signed)
[place]  Patient:    Vicki Hernandez, Vicki Hernandez                  MRN: [id number] Proc. Date: 05/05/99 Attending:  Anselmo Rod, M.D. CC:         Winn Jock. Charmian Muff, M.D.                           Operative Report  DATE OF BIRTH:  [birth year]  REFERRING PHYSICIAN:  Winn Jock. Charmian Muff, M.D.  PROCEDURE PERFORMED:  Colonoscopy with biopsy x 1 and snare polypectomy x 1.  ENDOSCOPIST:  Anselmo Rod, M.D.  INSTRUMENT:  Olympus video colonoscope.  INDICATION FOR PROCEDURE:  A 77 year old white female with a history of rectal bleeding and a previous history of polyps being removed from the colon. Rule out recurrent polyps, masses, hemorrhoids, etc.  PREPROCEDURE PREPARATION:  Informed consent was procured from the patient. The patient was fasting for eight hours prior to the procedure and prepped with a bottle of magnesium citrate and a gallon of NuLYTELY the night prior to the procedure.  PREPROCEDURE PHYSICAL:  VITAL SIGNS:  Stable.  NECK:  Supple.  CHEST:  Clear to auscultation.  HEART:  S1, S2 regular.  ABDOMEN:  Soft with normal abdominal bowel sounds.  Appendectomy scar present in the right lower quadrant.  DESCRIPTION OF THE PROCEDURE:  The patient was placed in the left lateral decubitus position and sedated with 100 mg of Demerol and 7 mg of Versed intravenous.  Once the patient was adequately sedated and maintained on low-flow oxygen and continuous cardiac monitoring, the Olympus video colonoscope was advanced from the rectum to the cecum without difficulty.  A small, flat polyp was removed from 20 cm by hot biopsy forceps and a slightly larger polyp found measuring 3 to 4 mm in size and removed by a snare polypectomy forceps.  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  No other masses or polyps were seen.  There were small internal hemorrhoids seen on retroflexion.  IMPRESSION: 1. 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 2. One small, flat polyp removed by hot biopsy from 20 cm, another larger    polyp measuring 3 to 4 mm removed by snare polypectomy at 20 cm as well. 3. Small, nonbleeding internal hemorrhoids. 4. No other masses or polyps seen.  RECOMMENDATIONS: 1. Await pathology results. 2. The patient has been advised to increase her fluid and fiber in her diet. 3. She is to avoid nonsteroidals for the next 7 to 10 days. 4. Outpatient followup advised within the next two weeks. DD:  05/05/99 TD:  05/05/99 Job: [id number]

## 2010-06-04 NOTE — Discharge Summary (Signed)
NAMESAMARIAH, HOKENSON            ACCOUNT NO.:  [id number]   MEDICAL RECORD NO.:  [id number]          PATIENT TYPE:  INP   LOCATION:  1604                         FACILITY:  [facility]   PHYSICIAN:  Alvy Beal, MD    DATE OF BIRTH:  04-23-1928   DATE OF ADMISSION:  03/24/2008  DATE OF DISCHARGE:  03/30/2008                               DISCHARGE SUMMARY   ADMISSION DIAGNOSIS:  Recurrent disk herniation/hematoma from previous  left L2-3 diskectomy.   DISCHARGE DIAGNOSIS:  Recurrent disk herniation/hematoma from previous  left L2-3 diskectomy.   OPERATIVE PROCEDURE:  March 26, 2008:  Revision of a lumbar diskectomy,  excision and evacuation of hematoma.   One consultation was obtained from [facility] by Dr. Danielle Dess  for radicular L3 nerve pain and disk herniation.  Patient's  postoperative course was essentially unremarkable.  She was ultimately  discharged the next day.  However, she was still having recurrent thigh  pain.  This was the same pain she had preoperatively.  All attempts at  conservative management consisting of pain medication and Medrol Dosepak  had failed to alleviate her symptoms.  Therefore, when she returned to  the office approximately 2 weeks postoperatively, she was readmitted for  a stat repeat MRI and pain control.  At that point, the repeat MRI  showed a hematoma with a recurrent disk herniation.  Second opinion was  requested by Dr. Danielle Dess, and the plan at that point was for the patient  to undergo a repeat decompression and diskectomy.  All risks, benefits,  and alternatives were discussed, and the patient and family agreed to  proceed with the surgery.   HOSPITAL COURSE:  Patient's hospital course was approximately 6 days in  length.  On hospital day #1, patient was admitted for pain control and  to have an MRI.  Again, on hospital day #1, the repeat MRI did  demonstrate a recurrent disk herniation and hematoma.  A second opinion  was  obtained on hospital day #2 by Dr. Danielle Dess that concurred with the  repeat disk herniation.  On hospital day #3, patient was taken back to  the operating room to undergo the revision lumbar diskectomy and  excision and evacuation of a hematoma.  On hospital day #4 which was  postoperative day #1, patient continued to do well.  She began to work  with physical therapy.  Her pain had improved.  Postoperatively, day #2,  patient continued to work well with physical therapy.  Her pain  continued to improve.  She had some mild complaints of urinary  incontinence.  Therefore, Dr. Shon Baton did check for any evidence of cauda  equina syndrome, and sensations and rectal tone were intact.  There was  no evidence of cauda equina syndrome.  Postoperatively, day #3, patient  continued to work well with physical therapy.  She was ambulating with  the use of a walker.  She was tolerating a regular diet.  She had no  complaints of chest pain or shortness of breath.  She was voiding on her  own and having regular bowel movements, and her compartments remained  soft and nontender.  Therefore, she was deemed stable to be discharged  home with [facility].   LABORATORIES AT DISCHARGE:  Included a sodium of 139, potassium 3.9,  chloride of 101, bicarb of 33, glucose 122, BUN 11 and creatinine of  0.87, WBCs of 8.3, RBCs of 3.43, hemoglobin 10.7 and hematocrit 32.2.   DISCHARGE CONDITION:  Stable.  Again, patient is being discharged home  with [facility].   DISCHARGE MEDICATIONS:  Home medications:  1. Robaxin 500 mg 1 q.8 h. as needed for pain.  2. Vytorin 10/20 mg daily.  3. Ocuvite 1 tablet daily.  4. Percocet 5/325 one tablet p.o. q.6 h. p.r.n. pain.  5. Nexium 40 mg daily.  6. Micardis 80/12.5 one-half tablet daily.  7. Lyrica 50 mg daily.  Patient was not discharged home on any new medications.   DISCHARGE INSTRUCTIONS:  Patient was given preprinted discharge   instructions that went over her activity level, when she is allowed to  shower, and when to call the office.  The patient is to walk as much as  possible.  She is not to lift anything heavier than 1 gallon of milk  which is approximately 6 pounds.  She is to avoid any bending, stooping,  twisting or squatting for approximately 2 weeks.  She is to avoid any  rigorous activity.  She is not allowed to drive for 6 weeks.  She is to  ambulate with the use of a walker as needed.  Patient is instructed to  contact our office at [phone] for any increased fevers, chills, loss of  bowel or bladder function, increased pain.  Again, call at [phone].  The patient is also to schedule her followup with Dr. Shon Baton in  approximately 2 weeks.  At that point, he will remove the sutures and  check her wound.  Again, patient is to schedule that appointment at our  office at [phone].   DISCHARGE MEDICATIONS:      Crissie Reese, [place]      Alvy Beal, MD  Electronically Signed    AC/MEDQ  D:  05/12/2008  T:  05/12/2008  Job:  [id number]

## 2010-06-04 NOTE — Op Note (Signed)
NAMETAHEERA, Vicki Hernandez            ACCOUNT NO.:  [id number]   MEDICAL RECORD NO.:  [id number]          PATIENT TYPE:  AMB   LOCATION:  ENDO                         FACILITY:  [facility]   PHYSICIAN:  Anselmo Rod, M.D.  DATE OF BIRTH:  1928/11/21   DATE OF PROCEDURE:  06/02/2004  DATE OF DISCHARGE:                                 OPERATIVE REPORT   PROCEDURE PERFORMED:  Screening colonoscopy.   ENDOSCOPIST:  Anselmo Rod, M.D.   INSTRUMENT USED:  Olympus video colonoscope.   INDICATION FOR PROCEDURE:  A 77 year old white female undergoing a screening  colonoscopy to rule out colonic polyps, masses, etc.   PREPROCEDURE PREPARATION:  Informed consent was procured from the patient.  The patient was fasted for eight hours prior to the procedure and prepped  with a bottle of magnesium citrate and a gallon of Go-LYTELY the night prior  to the procedure.  Risks and benefits of the procedure including a 10% miss  rate of cancer and polyp was discussed with the patient, as well.   PREPROCEDURE PHYSICAL:  The patient had stable vital signs.  Neck was  supple.  Chest was clear to auscultation.  S1, S2 regular.  Abdomen soft  with normal bowel sounds.   DESCRIPTION OF PROCEDURE:  The patient was placed in the left lateral  decubitus position, sedated with 75 mg of Demerol and 7.5 mg of Versed in  slow incremental doses.  Once the patient was adequately sedated and  maintained on low-flow oxygen and continuous cardiac monitoring, the Olympus  video colonoscope was advanced from the rectum to the cecum.  The patient  __________ colon and multiple washes were done.  There was evidence of  melanosis coli throughout the colon.  Retroflexion of rectum revealed small  internal hemorrhoids.  No masses, polyps, erosions, ulcerations, or  diverticula were seen.   IMPRESSION:  1. Small nonbleeding internal hemorrhoid.  2. Evidence of melanosis coli throughout the colon.  No masses or polyps  seen.     RECOMMENDATIONS:  1. Continue a high fiber diet with liberal fluid intake.  2. Avoid laxative use, stool softeners as needed.  3. Repeat colonoscopy in the next 10 years unless the patient develops any      abdominal symptoms in the interim.  4. Outpatient followup as need arises in the future.        JNM/MEDQ  D:  06/02/2004  T:  06/02/2004  Job:  [id number]   cc:   C. Duane Lope, M.D.  [address]  [place]  [place] [address]  Fax: [phone]

## 2010-06-04 NOTE — Discharge Summary (Signed)
Vicki Hernandez, Vicki Hernandez            ACCOUNT NO.:  [id number]   MEDICAL RECORD NO.:  [id number]          PATIENT TYPE:  INP   LOCATION:  1605                         FACILITY:  [facility]   PHYSICIAN:  Alvy Beal, MD    DATE OF BIRTH:  27-Feb-1928   DATE OF ADMISSION:  03/12/2008  DATE OF DISCHARGE:  03/15/2008                               DISCHARGE SUMMARY   ADMISSION DIAGNOSIS:  Left lateral disk herniation with foraminal and  lateral recess stenosis at L2-3.   DISCHARGE DIAGNOSIS:  Left lateral disk herniation with foraminal and  lateral recess stenosis at L2-3.   OPERATIVE PROCEDURE:  L2-3 lumbar decompression and diskectomy.   BRIEF HISTORY:  Vicki Hernandez is a very pleasant 77 year old female who  was in excellent health and twisted while getting out of a car on  Monday, March 03, 2008.  She had horrific back pain and left-sided  anterior thigh pain.  Because of her extreme pain, she was seen  emergently in our acute care clinic by one of our partners who ordered a  stat MRI.  Based on her MRI results, she followed up in the office with  Dr. Shon Baton for ongoing management.  Patient was having severe pain and  was scheduled for a lumbar decompression and diskectomy on March 13, 2008.  However, on March 12, 2008, because her pain was  uncontrollable, we pre-admitted her into the hospital on March 12, 2008, for pain control to help alleviate her back pain.   HOSPITAL COURSE:  Patient was admitted on March 12, 2008, for pain  control.  On March 13, 2008, she successfully underwent an L2-3  lumbar decompression and diskectomy and was transferred from the PACU to  the orthopedic floor without incident.  Postoperatively, day #1, patient  was working well with physical therapy.  Her vital signs remained  stable.  Her leg pain had improved.  She was tolerating a regular diet.  Voiding on her own.  Her abdomen was soft and nontender.  Her  compartments were soft and  nontender.  She was ambulating well with  physical therapy with the use of a walker.  Postoperatively, day #2,  patient continued to make progress.  She no longer had any radicular-  type leg pain.  Again, she remained a afebrile.  Her vitals were stable  and patient was having no discomfort.  Patient was therefore deemed  stable to be discharged home with a home health service.   DISCHARGE MEDICATIONS:  Patient was being discharged to home on all of  her home medications that included:  1. Micardis.  2. Vytorin.  3. Senna Plus.  4. Lyrica.  5. Vision Formula.  6. Nexium 40 mg.  7. Vitamin D3 at 2000 units.   Patient was discharged home on new medication of Percocet 5/325 one to 2  tabs p.o. every 6 hours as needed for pain, as well as Robaxin 500 mg 1  tablet p.o. every 8 hours p.r.n. pain.   DISCHARGE INSTRUCTIONS:  Patient was instructed that she is to walk as  much as possible.  She is to  follow back precautions.  No bending,  stooping, twisting, or squatting.  She is to keep her incision clean and  dry.  She may shower.  She cannot bathe or soak.  She is to increase her  activity slowly.  She may walk up steps as needed.  She is to work with  the home physical therapy and she is to schedule her followup  appointment with Dr. Shon Baton in approximately 10 days.   FOLLOWUP:  Patient is to schedule her followup with Dr. Shon Baton.  She is  to contact the office at [phone] for suture removal.      Vicki Reese, PA      Alvy Beal, MD  Electronically Signed    AC/MEDQ  D:  04/09/2008  T:  04/09/2008  Job:  [id number]

## 2010-06-04 NOTE — Consult Note (Signed)
[facility]  Patient:    Vicki Hernandez, Vicki Hernandez                   MRN: [id number] Adm. Date:  [id number] Attending:  Benny Lennert                          Consultation Report  REFERRING PHYSICIAN:  Dr. Carleene Cooper III.  INDICATION FOR CONSULTATION:  Chest pain.  HISTORY:  Vicki Hernandez is a pleasant 77 year old female who noted at approximately noon today, a sudden increase in her heart rate associated with fullness in her chest.  There was no associated shortness of breath, nausea, vomiting or diaphoresis.  This persisted for more than 30 to 40 minutes and the patient presented to the emergency room.  A blood pressure monitor revealed her heart rate to be 114.  Systolic pressure according to the patients monitor was 170/111.  The patient notes she has a past medical history of tachyarrhythmia, with the first episode occurring approximately five years ago.  Since that time, she has had one to two episodes per year which are self-limiting for approximately 30 minutes.  She has also had a history of hiatal hernia.  She attributes this current chest fullness to her hiatal hernia.  She had a stress test performed by Dr. Gaspar Garbe B. Little in 1992.  She exercised a total of 9 minutes and 51 seconds and had no ST changes to suggest ectopy or ischemia and had no ectopy with the stress test. Coronary risk factors include postmenopausal status, age and dyslipidemia; her current lipid profile is unknown.  She has been followed by Dr. Feliciana Rossetti in the past for dyslipidemia but is currently attempting to find a new primary care physician.  PAST MEDICAL HISTORY 1. Osteoporosis. 2. Hiatal hernia. 3. Degenerative arthritis. 4. Dyslipidemia.  CURRENT MEDICATIONS: Vioxx, Lipitor -- dose is unknown, Tagamet p.r.n. and an herb.  ALLERGIES:  CODEINE and SULFA which result in a rash.  PAST SURGICAL HISTORY 1. Hysterectomy. 2. Appendectomy. 3. T&A. 4.  Hemorrhoidectomy x 3. 5. Excision of benign breast tumor. 6. Cervical laminectomy.  SOCIAL HISTORY:  She is married.  She has two children.  She has smoked since she was in college.  She has attempted to stop in the past with Nicorette Gum but has been unsuccessful.  Social drinker only.  She has been married for 50 years.  She has two children that live in [place].  Previously self-employed on tropical plants.  PHYSICAL EXAMINATION  VITAL SIGNS:  On physical exam, her initial blood pressure was 181/126 with a heart rate of 124 and respiratory rate of 24.  Current heart rate is 60; blood pressure is 160/60.  HEENT:  Unremarkable.  NECK:  There are no carotid bruits.  Good carotid upstroke.  Thyroid nonpalpable.  PULMONARY:  Exam reveals breath sounds which are equal and clear to auscultation.  No use of accessory muscles.  CARDIOVASCULAR:  Exam reveals a 1 to 2 systolic ejection murmur, regular rate and rhythm.  PMI is within normal limits.  ABDOMEN:  Soft and benign.  No epigastric bruits noted.  No unusual pulsation.  EXTREMITIES:  Distal pulses which are equal and palpable.  SKIN:  Warm and dry.  No clubbing, cyanosis, or edema.  LABORATORY DATA:  Laboratory data reviewed.  Electrolytes are within normal limits.  Glucose is borderline high at 120.  CK total is 66 with a CK-MB of 0.8.  White count 6.2, hematocrit is 40.1, platelet count is 265,000.  ECG reveals a normal sinus rhythm, normal R wave progression, normal ECG.  IMPRESSION 1. Chest pain with short duration of tachyarrhythmia.  She has had    tachyarrhythmia in the past which has been self-limiting.  Cardiac enzymes    are unremarkable.  Similar episode of chest pain in the past attributed to    hiatal hernia with a negative stress test.  In view of her limited risk    factors which include age, postmenopausal status and tobacco use, along    with dyslipidemia, will recommend a baby aspirin daily.  A  stress    Cardiolite will be obtained for further evaluation of her chest pain.  The    patient has also been initiated on Pepcid 20 mg p.o. b.i.d. 2. Paroxysmal atrial tachycardia.  The tachyarrhythmia was not documented.  It    was discussed with the patient should the tachyarrhythmia become more    persistent, will proceed with an event monitor for further evaluation. 3. Hiatal hernia:  Pepcid 20 mg p.o. b.i.d., as noted above. 4. Tobacco abuse:  Smoking cessation was discussed at length with the patient. DD:  01/05/00 TD:  01/06/00 Job: [id number]

## 2010-10-07 ENCOUNTER — Other Ambulatory Visit (HOSPITAL_COMMUNITY): Payer: Self-pay | Admitting: Orthopedic Surgery

## 2010-10-07 DIAGNOSIS — M25562 Pain in left knee: Secondary | ICD-10-CM

## 2010-10-07 DIAGNOSIS — T84038A Mechanical loosening of other internal prosthetic joint, initial encounter: Secondary | ICD-10-CM

## 2010-10-07 DIAGNOSIS — Z96659 Presence of unspecified artificial knee joint: Secondary | ICD-10-CM

## 2010-10-19 ENCOUNTER — Encounter (HOSPITAL_COMMUNITY)
Admission: RE | Admit: 2010-10-19 | Discharge: 2010-10-19 | Disposition: A | Discharge status: Home / Self Care | Payer: PRIVATE HEALTH INSURANCE | Source: Ambulatory Visit | Attending: Orthopedic Surgery | Admitting: Orthopedic Surgery

## 2010-10-19 ENCOUNTER — Ambulatory Visit (HOSPITAL_COMMUNITY): Admission: RE | Admit: 2010-10-19 | Payer: Self-pay | Source: Ambulatory Visit

## 2010-10-19 DIAGNOSIS — M7989 Other specified soft tissue disorders: Secondary | ICD-10-CM | POA: Insufficient documentation

## 2010-10-19 DIAGNOSIS — Z96659 Presence of unspecified artificial knee joint: Secondary | ICD-10-CM | POA: Insufficient documentation

## 2010-10-19 DIAGNOSIS — M25562 Pain in left knee: Secondary | ICD-10-CM

## 2010-10-19 DIAGNOSIS — M25569 Pain in unspecified knee: Secondary | ICD-10-CM | POA: Insufficient documentation

## 2010-10-19 DIAGNOSIS — T84038A Mechanical loosening of other internal prosthetic joint, initial encounter: Secondary | ICD-10-CM

## 2010-10-19 MED ORDER — TECHNETIUM TC 99M MEDRONATE IV KIT
25.0000 | PACK | Freq: Once | INTRAVENOUS | Status: AC | PRN
  Administered 2010-10-19: 25 via INTRAVENOUS

## 2011-03-14 ENCOUNTER — Telehealth: Payer: Self-pay | Admitting: Surgery

## 2011-03-14 NOTE — Telephone Encounter (Signed)
Pt is being referred from Dr Waverly Ferrari office. Per the daughter, she had requested [facility] or [facility]. We recvd the packet and there were no indications that she needed a specific doctor, so we scheduled next available with [facility] on 03/22/11. Pt daughter Aram Beecham called and wanted to r/s with [facility]FE or [facility] b/c she is familiar with them. I explained that they did not have anything available until April b/c she needs repeat Carotids. Pts daughter understands and does not want to push out the appt due to TIA symptoms. She would however like for someone to look over the schedule and make sure there is no way to get TFE or [facility] ASAP. I said I would talk to our managers, but I could not see anything.

## 2011-03-16 ENCOUNTER — Encounter: Payer: Self-pay | Admitting: Surgery

## 2011-03-16 NOTE — Telephone Encounter (Signed)
I confirmed that there are no openings with TFE or [facility] before April.  This message will be forwarded to Vicki Hernandez to assure that the patient is placed on a waiting list for possible cancellations with the provider of her choice.

## 2011-03-18 ENCOUNTER — Encounter: Payer: Self-pay | Admitting: Surgery

## 2011-03-21 ENCOUNTER — Ambulatory Visit (INDEPENDENT_AMBULATORY_CARE_PROVIDER_SITE_OTHER): Payer: 59 | Admitting: Surgery

## 2011-03-21 ENCOUNTER — Encounter: Payer: Self-pay | Admitting: *Deleted

## 2011-03-21 ENCOUNTER — Other Ambulatory Visit (INDEPENDENT_AMBULATORY_CARE_PROVIDER_SITE_OTHER): Payer: Medicare Other | Admitting: Vascular Surgery

## 2011-03-21 ENCOUNTER — Encounter: Payer: Self-pay | Admitting: Surgery

## 2011-03-21 ENCOUNTER — Other Ambulatory Visit: Payer: Self-pay | Admitting: *Deleted

## 2011-03-21 VITALS — BP 114/49 | HR 72 | Resp 16 | Ht 63.0 in | Wt 128.0 lb

## 2011-03-21 DIAGNOSIS — I6529 Occlusion and stenosis of unspecified carotid artery: Secondary | ICD-10-CM

## 2011-03-21 NOTE — Progress Notes (Signed)
[facility]   Patient name: Vicki Hernandez MRN: [id number] DOB: [birth year] Sex: female   Referred by: Miguel Aschoff  Reason for referral:  Chief Complaint  Patient presents with  . New Evaluation    Eval for carotid Endarterectomy  Ref. Dr. Krista Blue    HISTORY OF PRESENT ILLNESS: The patient comes in today for evaluation of her carotid disease. A left carotid bruit was recently detected. This was followed up with a carotid ultrasound revealing significant left-sided stenosis. The patient reports that for the past year and a half she's had difficulty with word finding. There has been no change in the symptoms recently. She has macular degeneration which is being followed at [facility]. She denies symptoms of amaurosis fugax. She denies numbness or weakness in either extremity. She does report last week that after taking a muscle relaxer for hip pain she had slurred speech confusion and dizziness several hours after the medication was administered. Other than that she does not endorse any neurological symptoms.  The patient is medically managed for hypercholesterolemia and hypertension. She does have a history of an irregular heartbeat. She is maintained only on aspirin which was recently started. She had a normal stress test several years ago.  Past Medical History  Diagnosis Date  . Hypertension   . Arthritis   . GERD (gastroesophageal reflux disease)   . Hyperlipidemia   . Macular degeneration   . Accessory kidney   . Carotid artery bruit     Past Surgical History  Procedure Date  . Spine surgery 2002, 2010    Lumbar disk/ spine surgeries X 2   By Dr. Shon Baton  . Abdominal hysterectomy     TAH w/ BSO  . Hemorrhoid surgery   . Knee surgery   . Joint replacement 2012    Left Knee    History   Social History  . Marital Status: Married    Spouse Name: N/A    Number of Children: N/A  . Years of Education: N/A   Occupational  History  . Not on file.   Social History Main Topics  . Smoking status: Current Everyday Smoker -- 0.5 packs/day  . Smokeless tobacco: Not on file  . Alcohol Use: Yes     social  . Drug Use: No  . Sexually Active:    Other Topics Concern  . Not on file   Social History Narrative  . No narrative on file    Family History  Problem Relation Age of Onset  . Hyperlipidemia Father   . Hypertension Father   . Heart disease Father     Heart Disease before age 12  . Heart attack Father     Allergies as of 03/21/2011 - Review Complete 03/21/2011  Allergen Reaction Noted  . Codeine  03/16/2011  . Prevacid  03/16/2011  . Sulfa antibiotics  03/16/2011    Current Outpatient Prescriptions on File Prior to Visit  Medication Sig Dispense Refill  . aspirin 81 MG tablet Take 81 mg by mouth daily.      Marland Kitchen losartan-hydrochlorothiazide (HYZAAR) 50-12.5 MG per tablet Take 1 tablet by mouth daily.      Marland Kitchen omeprazole (PRILOSEC) 20 MG capsule Take 20 mg by mouth daily.      . pregabalin (LYRICA) 50 MG capsule Take 50 mg by mouth 2 (two) times daily.      . Sennosides (SENOKOT PO) Take by mouth.      . simvastatin (ZOCOR)  40 MG tablet Take 40 mg by mouth every evening.      . traMADol (ULTRAM) 50 MG tablet Take 50 mg by mouth every 6 (six) hours as needed.      Marland Kitchen VITAMIN D, ERGOCALCIFEROL, PO Take by mouth.      . Multiple Vitamins-Minerals (EYE-VITES PO) Take by mouth.         REVIEW OF SYSTEMS: Cardiovascular: Positive for chest pain chest pressure and palpitations Pulmonary: No productive cough, asthma or wheezing. Neurologic: See history of present illness Hematologic: No bleeding problems or clotting disorders. Musculoskeletal: No joint pain or joint swelling. Gastrointestinal: No blood in stool or hematemesis Genitourinary: No dysuria or hematuria. Psychiatric:: No history of major depression. Integumentary: No rashes or ulcers. Constitutional: No fever or chills.  PHYSICAL  EXAMINATION: General: The patient appears their stated age.  Vital signs are BP 114/49  Pulse 72  Resp 16  Ht 5\' 3"  (1.6 m)  Wt 128 lb (58.06 kg)  BMI 22.67 kg/m2  SpO2 95% HEENT:  No gross abnormalities Pulmonary: Respirations are non-labored Abdomen: Soft and non-tender  Musculoskeletal: There are no major deformities.   Neurologic: No focal weakness or paresthesias are detected, cranial nerves II through XII are grossly intact Skin: There are no ulcer or rashes noted. Psychiatric: The patient has normal affect. Cardiovascular: There is a regular rate and rhythm without significant murmur appreciated. Left carotid bruit  Diagnostic Studies: Ultrasound was repeated and reviewed today he this shows 80-99% left carotid stenosis and 1-39% right carotid stenosis the bifurcation was noted to high    Medication Changes: None  Assessment:  Left carotid stenosis,? Symptomatic Plan: We discussed options for management including medical treatment, carotid stenting, and carotid endarterectomy. Based on the patient's overall health we have recommended proceeding with left carotid endarterectomy. The patient wishes this to be performed by Dr. Hart Rochester who is seen and evaluated the patient. I discussed the risks and benefits of the operation. We discussed the risk of nerve injury the risk of stroke. All of her questions were answered today her operation is going to be performed by Dr. Hart Rochester on March 8     V. Charlena Cross, M.D. [facility] [place] Office: [phone] Pager:  [phone]

## 2011-03-24 ENCOUNTER — Encounter (HOSPITAL_COMMUNITY): Payer: Self-pay | Admitting: Pharmacy Technician

## 2011-03-24 ENCOUNTER — Encounter (HOSPITAL_COMMUNITY)
Admission: RE | Admit: 2011-03-24 | Discharge: 2011-03-24 | Disposition: A | Discharge status: Home / Self Care | Payer: Medicare Other | Source: Ambulatory Visit | Attending: Anesthesiology | Admitting: Anesthesiology

## 2011-03-24 ENCOUNTER — Other Ambulatory Visit: Payer: Self-pay

## 2011-03-24 ENCOUNTER — Encounter (HOSPITAL_COMMUNITY): Payer: Self-pay

## 2011-03-24 ENCOUNTER — Encounter (HOSPITAL_COMMUNITY)
Admission: RE | Admit: 2011-03-24 | Discharge: 2011-03-24 | Disposition: A | Discharge status: Home / Self Care | Payer: Medicare Other | Source: Ambulatory Visit | Attending: Vascular Surgery | Admitting: Vascular Surgery

## 2011-03-24 HISTORY — DX: Cardiac arrhythmia, unspecified: I49.9

## 2011-03-24 HISTORY — DX: Personal history of other diseases of the digestive system: Z87.19

## 2011-03-24 HISTORY — DX: Other complications of anesthesia, initial encounter: T88.59XA

## 2011-03-24 HISTORY — DX: Angina pectoris, unspecified: I20.9

## 2011-03-24 HISTORY — DX: Cardiac murmur, unspecified: R01.1

## 2011-03-24 HISTORY — DX: Adverse effect of unspecified anesthetic, initial encounter: T41.45XA

## 2011-03-24 LAB — DIFFERENTIAL
Basophils Absolute: 0 10*3/uL (ref 0.0–0.1)
Basophils Relative: 0 % (ref 0–1)
Eosinophils Absolute: 0.4 10*3/uL (ref 0.0–0.7)
Eosinophils Relative: 5 % (ref 0–5)
Lymphocytes Relative: 41 % (ref 12–46)
Lymphs Abs: 3.9 10*3/uL (ref 0.7–4.0)
Monocytes Absolute: 0.6 10*3/uL (ref 0.1–1.0)
Monocytes Relative: 7 % (ref 3–12)
Neutro Abs: 4.4 10*3/uL (ref 1.7–7.7)
Neutrophils Relative %: 47 % (ref 43–77)

## 2011-03-24 LAB — COMPREHENSIVE METABOLIC PANEL
ALT: 9 U/L (ref 0–35)
AST: 16 U/L (ref 0–37)
Albumin: 3.5 g/dL (ref 3.5–5.2)
Alkaline Phosphatase: 71 U/L (ref 39–117)
BUN: 21 mg/dL (ref 6–23)
CO2: 31 mEq/L (ref 19–32)
Calcium: 9.5 mg/dL (ref 8.4–10.5)
Chloride: 100 mEq/L (ref 96–112)
Creatinine, Ser: 1.08 mg/dL (ref 0.50–1.10)
GFR calc Af Amer: 54 mL/min — ABNORMAL LOW (ref >90)
GFR calc non Af Amer: 47 mL/min — ABNORMAL LOW (ref >90)
Glucose, Bld: 80 mg/dL (ref 70–99)
Potassium: 3.9 mEq/L (ref 3.5–5.1)
Sodium: 139 mEq/L (ref 135–145)
Total Bilirubin: 0.3 mg/dL (ref 0.3–1.2)
Total Protein: 7.2 g/dL (ref 6.0–8.3)

## 2011-03-24 LAB — URINALYSIS, ROUTINE W REFLEX MICROSCOPIC
Bilirubin Urine: NEGATIVE
Glucose, UA: NEGATIVE mg/dL
Hgb urine dipstick: NEGATIVE
Ketones, ur: NEGATIVE mg/dL
Leukocytes, UA: NEGATIVE
Nitrite: NEGATIVE
Protein, ur: NEGATIVE mg/dL
Specific Gravity, Urine: 1.013 (ref 1.005–1.030)
Urobilinogen, UA: 0.2 mg/dL (ref 0.0–1.0)
pH: 7 (ref 5.0–8.0)

## 2011-03-24 LAB — CBC
HCT: 40 % (ref 36.0–46.0)
Hemoglobin: 13.3 g/dL (ref 12.0–15.0)
MCH: 30.5 pg (ref 26.0–34.0)
MCHC: 33.3 g/dL (ref 30.0–36.0)
MCV: 91.7 fL (ref 78.0–100.0)
Platelets: 217 10*3/uL (ref 150–400)
RBC: 4.36 MIL/uL (ref 3.87–5.11)
RDW: 15.4 % (ref 11.5–15.5)
WBC: 9.4 10*3/uL (ref 4.0–10.5)

## 2011-03-24 LAB — TYPE AND SCREEN
ABO/RH(D): O POS
Antibody Screen: NEGATIVE

## 2011-03-24 LAB — ABO/RH: ABO/RH(D): O POS

## 2011-03-24 LAB — PROTIME-INR
INR: 0.99 (ref 0.00–1.49)
Prothrombin Time: 13.3 seconds (ref 11.6–15.2)

## 2011-03-24 LAB — SURGICAL PCR SCREEN
MRSA, PCR: NEGATIVE
Staphylococcus aureus: NEGATIVE

## 2011-03-24 LAB — APTT: aPTT: 34 seconds (ref 24–37)

## 2011-03-24 MED ORDER — DEXTROSE 5 % IV SOLN
1.5000 g | INTRAVENOUS | Status: AC
  Administered 2011-03-25: 1.5 g via INTRAVENOUS
  Filled 2011-03-24: qty 1.5

## 2011-03-24 NOTE — Pre-Procedure Instructions (Signed)
22 Vicki Hernandez  03/24/2011   Your procedure is scheduled on:  Friday  03/25/11    Report to [facility] at 730 AM.  Call this number if you have problems the morning of surgery: [phone]   Remember:   Do not eat food:After Midnight.  May have clear liquids: up to 4 Hours before arrival.  Clear liquids include soda, tea, black coffee, apple or grape juice, broth.  Take these medicines the morning of surgery with A SIP OF WATER:  PRILOSEC, LYRICA, ULTRA   Do not wear jewelry, make-up or nail polish.  Do not wear lotions, powders, or perfumes. You may wear deodorant.  Do not shave 48 hours prior to surgery.  Do not bring valuables to the hospital.  Contacts, dentures or bridgework may not be worn into surgery.  Leave suitcase in the car. After surgery it may be brought to your room.  For patients admitted to the hospital, checkout time is 11:00 AM the day of discharge.   Patients discharged the day of surgery will not be allowed to drive home.  Name and phone number of your driver:   Special Instructions: CHG Shower Use Special Wash: 1/2 bottle night before surgery and 1/2 bottle morning of surgery.   Please read over the following fact sheets that you were given: Pain Booklet, MRSA Information and Surgical Site Infection Prevention

## 2011-03-25 ENCOUNTER — Inpatient Hospital Stay (HOSPITAL_COMMUNITY)
Admission: RE | Admit: 2011-03-25 | Discharge: 2011-03-26 | DRG: 038 | Disposition: A | Discharge status: Home / Self Care | Payer: Medicare Other | Source: Ambulatory Visit | Attending: Vascular Surgery | Admitting: Vascular Surgery

## 2011-03-25 ENCOUNTER — Other Ambulatory Visit: Payer: Self-pay

## 2011-03-25 ENCOUNTER — Ambulatory Visit (HOSPITAL_COMMUNITY): Payer: Medicare Other | Admitting: Anesthesiology

## 2011-03-25 ENCOUNTER — Encounter (HOSPITAL_COMMUNITY)
Admission: RE | Disposition: A | Discharge status: Home / Self Care | Payer: Self-pay | Source: Ambulatory Visit | Attending: Vascular Surgery

## 2011-03-25 ENCOUNTER — Encounter (HOSPITAL_COMMUNITY): Payer: Self-pay | Admitting: Anesthesiology

## 2011-03-25 DIAGNOSIS — Z7982 Long term (current) use of aspirin: Secondary | ICD-10-CM

## 2011-03-25 DIAGNOSIS — Z96659 Presence of unspecified artificial knee joint: Secondary | ICD-10-CM

## 2011-03-25 DIAGNOSIS — Z01810 Encounter for preprocedural cardiovascular examination: Secondary | ICD-10-CM

## 2011-03-25 DIAGNOSIS — Z79899 Other long term (current) drug therapy: Secondary | ICD-10-CM

## 2011-03-25 DIAGNOSIS — I10 Essential (primary) hypertension: Secondary | ICD-10-CM | POA: Diagnosis present

## 2011-03-25 DIAGNOSIS — Q638 Other specified congenital malformations of kidney: Secondary | ICD-10-CM

## 2011-03-25 DIAGNOSIS — I6529 Occlusion and stenosis of unspecified carotid artery: Secondary | ICD-10-CM

## 2011-03-25 DIAGNOSIS — M129 Arthropathy, unspecified: Secondary | ICD-10-CM | POA: Diagnosis present

## 2011-03-25 DIAGNOSIS — Z01818 Encounter for other preprocedural examination: Secondary | ICD-10-CM

## 2011-03-25 DIAGNOSIS — Z01812 Encounter for preprocedural laboratory examination: Secondary | ICD-10-CM

## 2011-03-25 DIAGNOSIS — D62 Acute posthemorrhagic anemia: Secondary | ICD-10-CM | POA: Diagnosis not present

## 2011-03-25 DIAGNOSIS — E7800 Pure hypercholesterolemia, unspecified: Secondary | ICD-10-CM | POA: Diagnosis present

## 2011-03-25 DIAGNOSIS — H3530 Unspecified macular degeneration: Secondary | ICD-10-CM | POA: Diagnosis present

## 2011-03-25 DIAGNOSIS — K219 Gastro-esophageal reflux disease without esophagitis: Secondary | ICD-10-CM | POA: Diagnosis present

## 2011-03-25 HISTORY — PX: ENDARTERECTOMY: SHX5162

## 2011-03-25 HISTORY — PX: CAROTID ENDARTERECTOMY: SUR193

## 2011-03-25 SURGERY — ENDARTERECTOMY, CAROTID
Anesthesia: General | Site: Neck | Laterality: Left | Wound class: Clean

## 2011-03-25 MED ORDER — PROTAMINE SULFATE 10 MG/ML IV SOLN
INTRAVENOUS | Status: DC | PRN
  Administered 2011-03-25: 50 mg via INTRAVENOUS

## 2011-03-25 MED ORDER — LACTATED RINGERS IV SOLN
INTRAVENOUS | Status: DC
  Administered 2011-03-25: 09:00:00 via INTRAVENOUS

## 2011-03-25 MED ORDER — SIMVASTATIN 40 MG PO TABS
40.0000 mg | ORAL_TABLET | Freq: Every evening | ORAL | Status: DC
  Administered 2011-03-25: 40 mg via ORAL
  Filled 2011-03-25 (×2): qty 1

## 2011-03-25 MED ORDER — SODIUM CHLORIDE 0.9 % IR SOLN
Status: DC | PRN
  Administered 2011-03-25: 12:00:00

## 2011-03-25 MED ORDER — PANTOPRAZOLE SODIUM 40 MG PO TBEC
40.0000 mg | DELAYED_RELEASE_TABLET | Freq: Every day | ORAL | Status: DC
  Administered 2011-03-25: 40 mg via ORAL
  Filled 2011-03-25: qty 1

## 2011-03-25 MED ORDER — CEFUROXIME SODIUM 1.5 G IJ SOLR
1.5000 g | Freq: Two times a day (BID) | INTRAMUSCULAR | Status: DC
  Administered 2011-03-25: 1.5 g via INTRAVENOUS
  Filled 2011-03-25 (×2): qty 1.5

## 2011-03-25 MED ORDER — FAMOTIDINE IN NACL 20-0.9 MG/50ML-% IV SOLN
20.0000 mg | Freq: Two times a day (BID) | INTRAVENOUS | Status: DC
  Administered 2011-03-25 (×2): 20 mg via INTRAVENOUS
  Filled 2011-03-25 (×4): qty 50

## 2011-03-25 MED ORDER — KCL IN DEXTROSE-NACL 20-5-0.45 MEQ/L-%-% IV SOLN
INTRAVENOUS | Status: AC
  Filled 2011-03-25: qty 1000

## 2011-03-25 MED ORDER — TEMAZEPAM 15 MG PO CAPS: 15.0000 mg | ORAL_CAPSULE | Freq: Every evening | ORAL | Status: DC | PRN

## 2011-03-25 MED ORDER — LACTATED RINGERS IV SOLN
INTRAVENOUS | Status: DC | PRN
  Administered 2011-03-25: 10:00:00 via INTRAVENOUS

## 2011-03-25 MED ORDER — KCL IN DEXTROSE-NACL 20-5-0.45 MEQ/L-%-% IV SOLN
INTRAVENOUS | Status: DC
  Filled 2011-03-25: qty 1000

## 2011-03-25 MED ORDER — ACETAMINOPHEN 650 MG RE SUPP: 325.0000 mg | RECTAL | Status: DC | PRN

## 2011-03-25 MED ORDER — ASPIRIN 81 MG PO TABS: 81.0000 mg | ORAL_TABLET | ORAL | Status: DC

## 2011-03-25 MED ORDER — SODIUM CHLORIDE 0.9 % IV SOLN: INTRAVENOUS | Status: DC

## 2011-03-25 MED ORDER — PROPOFOL 10 MG/ML IV EMUL
INTRAVENOUS | Status: DC | PRN
  Administered 2011-03-25: 100 mg via INTRAVENOUS

## 2011-03-25 MED ORDER — SENNA 8.6 MG PO TABS
1.0000 | ORAL_TABLET | Freq: Every day | ORAL | Status: DC
Start: 2011-03-25 — End: 2011-03-26
  Administered 2011-03-25: 8.6 mg via ORAL
  Filled 2011-03-25 (×2): qty 1

## 2011-03-25 MED ORDER — KCL IN DEXTROSE-NACL 20-5-0.45 MEQ/L-%-% IV SOLN
INTRAVENOUS | Status: DC
  Administered 2011-03-25 (×2): via INTRAVENOUS
  Filled 2011-03-25 (×3): qty 1000

## 2011-03-25 MED ORDER — PHENOL 1.4 % MT LIQD: 1.0000 | OROMUCOSAL | Status: DC | PRN

## 2011-03-25 MED ORDER — DOCUSATE SODIUM 100 MG PO CAPS
100.0000 mg | ORAL_CAPSULE | Freq: Every day | ORAL | Status: DC
  Filled 2011-03-25: qty 1

## 2011-03-25 MED ORDER — ONDANSETRON HCL 4 MG/2ML IJ SOLN: 4.0000 mg | Freq: Four times a day (QID) | INTRAMUSCULAR | Status: DC | PRN

## 2011-03-25 MED ORDER — GUAIFENESIN-DM 100-10 MG/5ML PO SYRP: 15.0000 mL | ORAL_SOLUTION | ORAL | Status: DC | PRN

## 2011-03-25 MED ORDER — LABETALOL HCL 5 MG/ML IV SOLN
5.0000 mg | Freq: Once | INTRAVENOUS | Status: AC
  Administered 2011-03-25: 5 mg via INTRAVENOUS

## 2011-03-25 MED ORDER — SODIUM CHLORIDE 0.9 % IV SOLN: 500.0000 mL | Freq: Once | INTRAVENOUS | Status: AC | PRN

## 2011-03-25 MED ORDER — LIDOCAINE HCL (CARDIAC) 20 MG/ML IV SOLN
INTRAVENOUS | Status: DC | PRN
  Administered 2011-03-25: 40 mg via INTRAVENOUS

## 2011-03-25 MED ORDER — ROCURONIUM BROMIDE 100 MG/10ML IV SOLN
INTRAVENOUS | Status: DC | PRN
  Administered 2011-03-25: 50 mg via INTRAVENOUS

## 2011-03-25 MED ORDER — LABETALOL HCL 5 MG/ML IV SOLN: 10.0000 mg | INTRAVENOUS | Status: DC | PRN

## 2011-03-25 MED ORDER — DOPAMINE-DEXTROSE 3.2-5 MG/ML-% IV SOLN: 3.0000 ug/kg/min | INTRAVENOUS | Status: DC

## 2011-03-25 MED ORDER — NITROGLYCERIN 0.4 MG SL SUBL
0.4000 mg | SUBLINGUAL_TABLET | SUBLINGUAL | Status: DC | PRN
  Administered 2011-03-25: 0.4 mg via SUBLINGUAL

## 2011-03-25 MED ORDER — HYDROMORPHONE HCL PF 1 MG/ML IJ SOLN
0.2500 mg | INTRAMUSCULAR | Status: DC | PRN
  Administered 2011-03-25 (×2): 0.25 mg via INTRAVENOUS

## 2011-03-25 MED ORDER — NITROGLYCERIN 0.4 MG SL SUBL
SUBLINGUAL_TABLET | SUBLINGUAL | Status: AC
  Administered 2011-03-25: 0.4 mg
  Filled 2011-03-25: qty 25

## 2011-03-25 MED ORDER — HYDROCHLOROTHIAZIDE 12.5 MG PO CAPS
12.5000 mg | ORAL_CAPSULE | Freq: Every day | ORAL | Status: DC
  Filled 2011-03-25: qty 1

## 2011-03-25 MED ORDER — ONDANSETRON HCL 4 MG/2ML IJ SOLN
INTRAMUSCULAR | Status: DC | PRN
  Administered 2011-03-25: 4 mg via INTRAVENOUS

## 2011-03-25 MED ORDER — MORPHINE SULFATE 2 MG/ML IJ SOLN
2.0000 mg | INTRAMUSCULAR | Status: DC | PRN
  Administered 2011-03-25 – 2011-03-26 (×4): 2 mg via INTRAVENOUS
  Filled 2011-03-25 (×4): qty 1

## 2011-03-25 MED ORDER — 0.9 % SODIUM CHLORIDE (POUR BTL) OPTIME
TOPICAL | Status: DC | PRN
  Administered 2011-03-25: 1000 mL

## 2011-03-25 MED ORDER — LOSARTAN POTASSIUM-HCTZ 50-12.5 MG PO TABS: 1.0000 | ORAL_TABLET | ORAL | Status: DC

## 2011-03-25 MED ORDER — NEOSTIGMINE METHYLSULFATE 1 MG/ML IJ SOLN
INTRAMUSCULAR | Status: DC | PRN
  Administered 2011-03-25: 3 mg via INTRAVENOUS

## 2011-03-25 MED ORDER — HEPARIN SODIUM (PORCINE) 1000 UNIT/ML IJ SOLN
INTRAMUSCULAR | Status: DC | PRN
  Administered 2011-03-25: 6000 [IU] via INTRAVENOUS

## 2011-03-25 MED ORDER — SUFENTANIL CITRATE 50 MCG/ML IV SOLN
INTRAVENOUS | Status: DC | PRN
  Administered 2011-03-25: 5 ug via INTRAVENOUS
  Administered 2011-03-25: 20 ug via INTRAVENOUS
  Administered 2011-03-25: 5 ug via INTRAVENOUS

## 2011-03-25 MED ORDER — TRAMADOL HCL 50 MG PO TABS: 50.0000 mg | ORAL_TABLET | Freq: Four times a day (QID) | ORAL | Status: DC | PRN

## 2011-03-25 MED ORDER — HYDROMORPHONE HCL PF 1 MG/ML IJ SOLN
INTRAMUSCULAR | Status: AC
  Filled 2011-03-25: qty 1

## 2011-03-25 MED ORDER — ACETAMINOPHEN 325 MG PO TABS: 325.0000 mg | ORAL_TABLET | ORAL | Status: DC | PRN

## 2011-03-25 MED ORDER — POTASSIUM CHLORIDE CRYS ER 20 MEQ PO TBCR: 20.0000 meq | EXTENDED_RELEASE_TABLET | Freq: Once | ORAL | Status: AC | PRN

## 2011-03-25 MED ORDER — METOPROLOL TARTRATE 1 MG/ML IV SOLN: 2.0000 mg | INTRAVENOUS | Status: DC | PRN

## 2011-03-25 MED ORDER — PREGABALIN 50 MG PO CAPS
50.0000 mg | ORAL_CAPSULE | Freq: Every day | ORAL | Status: DC
  Administered 2011-03-25: 50 mg via ORAL
  Filled 2011-03-25: qty 1

## 2011-03-25 MED ORDER — GLYCOPYRROLATE 0.2 MG/ML IJ SOLN
INTRAMUSCULAR | Status: DC | PRN
  Administered 2011-03-25: .4 mg via INTRAVENOUS

## 2011-03-25 MED ORDER — HYDRALAZINE HCL 20 MG/ML IJ SOLN: 10.0000 mg | INTRAMUSCULAR | Status: DC | PRN

## 2011-03-25 MED ORDER — ASPIRIN EC 325 MG PO TBEC
325.0000 mg | DELAYED_RELEASE_TABLET | Freq: Every day | ORAL | Status: DC
  Filled 2011-03-25: qty 1

## 2011-03-25 MED ORDER — PHENYLEPHRINE HCL 10 MG/ML IJ SOLN
10.0000 mg | INTRAVENOUS | Status: DC | PRN
  Administered 2011-03-25: 15 ug/min via INTRAVENOUS

## 2011-03-25 MED ORDER — LOSARTAN POTASSIUM 50 MG PO TABS
50.0000 mg | ORAL_TABLET | Freq: Every day | ORAL | Status: DC
  Filled 2011-03-25: qty 1

## 2011-03-25 SURGICAL SUPPLY — 44 items
CANISTER SUCTION 2500CC (MISCELLANEOUS) ×2 IMPLANT
CATH ROBINSON RED A/P 18FR (CATHETERS) ×2 IMPLANT
CATH SUCT 10FR WHISTLE TIP (CATHETERS) ×2 IMPLANT
CLIP TI MEDIUM 24 (CLIP) ×2 IMPLANT
CLIP TI WIDE RED SMALL 24 (CLIP) ×2 IMPLANT
CLOTH BEACON ORANGE TIMEOUT ST (SAFETY) ×2 IMPLANT
COVER SURGICAL LIGHT HANDLE (MISCELLANEOUS) ×4 IMPLANT
CRADLE DONUT ADULT HEAD (MISCELLANEOUS) ×2 IMPLANT
DECANTER SPIKE VIAL GLASS SM (MISCELLANEOUS) IMPLANT
DRAIN HEMOVAC 1/8 X 5 (WOUND CARE) IMPLANT
DRAPE WARM FLUID 44X44 (DRAPE) ×2 IMPLANT
DRSG COVADERM 4X8 (GAUZE/BANDAGES/DRESSINGS) ×1 IMPLANT
ELECT REM PT RETURN 9FT ADLT (ELECTROSURGICAL) ×2
ELECTRODE REM PT RTRN 9FT ADLT (ELECTROSURGICAL) ×1 IMPLANT
EVACUATOR SILICONE 100CC (DRAIN) IMPLANT
GLOVE BIO SURGEON STRL SZ 6.5 (GLOVE) ×2 IMPLANT
GLOVE BIOGEL PI IND STRL 6.5 (GLOVE) ×1 IMPLANT
GLOVE BIOGEL PI IND STRL 7.5 (GLOVE) ×1 IMPLANT
GLOVE BIOGEL PI INDICATOR 6.5 (GLOVE) ×1
GLOVE BIOGEL PI INDICATOR 7.5 (GLOVE) ×1
GLOVE ECLIPSE 6.5 STRL STRAW (GLOVE) ×1 IMPLANT
GLOVE SS BIOGEL STRL SZ 7 (GLOVE) ×1 IMPLANT
GLOVE SUPERSENSE BIOGEL SZ 7 (GLOVE) ×1
GOWN STRL NON-REIN LRG LVL3 (GOWN DISPOSABLE) ×4 IMPLANT
INSERT FOGARTY SM (MISCELLANEOUS) ×2 IMPLANT
KIT BASIN OR (CUSTOM PROCEDURE TRAY) ×2 IMPLANT
KIT ROOM TURNOVER OR (KITS) ×2 IMPLANT
NEEDLE 22X1 1/2 (OR ONLY) (NEEDLE) IMPLANT
NS IRRIG 1000ML POUR BTL (IV SOLUTION) ×4 IMPLANT
PACK CAROTID (CUSTOM PROCEDURE TRAY) ×2 IMPLANT
PAD ARMBOARD 7.5X6 YLW CONV (MISCELLANEOUS) ×4 IMPLANT
PATCH HEMASHIELD 8X75 (Vascular Products) ×1 IMPLANT
SHUNT CAROTID BYPASS 12FRX15.5 (VASCULAR PRODUCTS) IMPLANT
SPECIMEN JAR SMALL (MISCELLANEOUS) ×2 IMPLANT
SUT PROLENE 6 0 BV (SUTURE) ×8 IMPLANT
SUT PROLENE 6 0 CC (SUTURE) ×3 IMPLANT
SUT SILK 2 0 FS (SUTURE) ×2 IMPLANT
SUT VIC AB 2-0 CT1 27 (SUTURE) ×2
SUT VIC AB 2-0 CT1 TAPERPNT 27 (SUTURE) ×1 IMPLANT
SUT VIC AB 3-0 X1 27 (SUTURE) ×2 IMPLANT
SYR CONTROL 10ML LL (SYRINGE) IMPLANT
TOWEL OR 17X24 6PK STRL BLUE (TOWEL DISPOSABLE) ×3 IMPLANT
TOWEL OR 17X26 10 PK STRL BLUE (TOWEL DISPOSABLE) ×2 IMPLANT
WATER STERILE IRR 1000ML POUR (IV SOLUTION) ×2 IMPLANT

## 2011-03-25 NOTE — Op Note (Signed)
OPERATIVE REPORT  Date of Surgery: 03/25/2011  Surgeon: Josephina Gip, MD  Assistant: Newton Pigg PA  Pre-op Diagnosis: LEFT ICA STENOSIS-severe with recent left brain TIA Post-op Diagnosis: Same  Procedure: Procedure(s): ENDARTERECTOMY CAROTID left with Dacron patch angioplasty Resection redundant left common carotid artery with primary reanastomosis Anesthesia: General  EBL: 200 cc  Complications: None  Procedure Details: The patient was taken to the operating room and placed in the supine position. Following induction of satisfactory general endotracheal anesthesia the left neck was prepped and draped in a routine sterile manner. Incision was made on the anterior border of the sternocleidomastoid muscle and carried down through the subcutaneous tissue and platysma using the Bovie. Care was taken not to injure the hypoglossal nerve.. The common internal and external carotid arteries were dissected free. There was a calcified atherosclerotic plaque at the carotid bifurcation extending up the internal carotid artery. A #10 shunt was then prepared and the patient was heparinized. The carotid vessels were occluded with vascular clamps. A longitudinal opening was made in the common carotid with a 15 blade extended up the internal carotid with the Potts scissors to a point distal to the disease. The plaque was approximately 90% % stenotic in severity. The distal vessel appeared normal but was quite redundant with a slight kink medially. It was felt that this would kink following the endarterectomy with a patch being applied and so preparation was made to resect a short portion of the artery to eliminate the kink. Shunt was inserted without difficulty reestablishing flow in about 2 minutes. A standard endarterectomy was performed with an eversion endarterectomy of the external carotid. The plaque feathered off  the distal internal carotid artery nicely not requiring any tacking sutures. The lumen  was thoroughly irrigated with heparinized saline and loose debris all carefully removed following this the superior thyroid artery was ligated with 3-0 silk tie and divided to help mobilize the carotid bifurcation. Because the internal carotid was quite thin it was decided to resect about a 1-2 cm segment of the common carotid. Tacking sutures of 6-0 Prolene were then utilized as markers and a 1-2 cm segment of common carotid artery was resected with primary anastomosis done with 6-0 Prolene. The arterotomy was then closed with a patch using continuous 6-0 Prolene. Prior to completion of the  Closure the  shunt was removed after approximately 40 minutes of shunt time. Flow was then reestablished up the external branch initially followed by the internal branch. Protamine was given to her reverse the heparin. The vessel was carefully examined there was no evidence of any kink there was excellent Doppler flow in the internal and external carotid arteries..Following adequate hemostasis the wound was irrigated with saline and closed in layers with Vicryl ain a subcuticular fashion. Sterile dressing was applied and the patient taken to the recovery room in stable condition.  Josephina Gip, MD 03/25/2011 12:30 PM

## 2011-03-25 NOTE — Anesthesia Preprocedure Evaluation (Addendum)
Anesthesia Evaluation  Patient identified by MRN, date of birth, ID band Patient awake    Reviewed: Allergy & Precautions, H&P , NPO status , Patient's Chart, lab work & pertinent test results  History of Anesthesia Complications Negative for: history of anesthetic complications  Airway Mallampati: II TM Distance: >3 FB Neck ROM: Full    Dental No notable dental hx. (+) Teeth Intact   Pulmonary neg pulmonary ROS,  breath sounds clear to auscultation  Pulmonary exam normal       Cardiovascular hypertension, On Medications + angina + Valvular Problems/Murmurs Rhythm:Regular Rate:Normal + Systolic murmurs    Neuro/Psych negative neurological ROS  negative psych ROS   GI/Hepatic Neg liver ROS, GERD-  Medicated and Controlled,  Endo/Other  negative endocrine ROS  Renal/GU negative Renal ROS  negative genitourinary   Musculoskeletal   Abdominal   Peds  Hematology negative hematology ROS (+)   Anesthesia Other Findings   Reproductive/Obstetrics negative OB ROS                           Anesthesia Physical Anesthesia Plan  ASA: III  Anesthesia Plan: General   Post-op Pain Management:    Induction: Intravenous  Airway Management Planned: Oral ETT  Additional Equipment: Arterial line  Intra-op Plan:   Post-operative Plan: Extubation in OR  Informed Consent: I have reviewed the patients History and Physical, chart, labs and discussed the procedure including the risks, benefits and alternatives for the proposed anesthesia with the patient or authorized representative who has indicated his/her understanding and acceptance.     Plan Discussed with: CRNA  Anesthesia Plan Comments:         Anesthesia Quick Evaluation

## 2011-03-25 NOTE — Interval H&P Note (Signed)
History and Physical Interval Note:  03/25/2011 10:13 AM  [name]  has presented today for surgery, with the diagnosis of LEFT ICA STENOSIS  The various methods of treatment have been discussed with the patient and family. After consideration of risks, benefits and other options for treatment, the patient has consented to  Procedure(s) (LRB): ENDARTERECTOMY CAROTID (Left) as a surgical intervention .  The patients' history has been reviewed, patient examined, no change in status, stable for surgery.  I have reviewed the patients' chart and labs.  Questions were answered to the patient's satisfaction.     Vicki Hernandez

## 2011-03-25 NOTE — Anesthesia Postprocedure Evaluation (Signed)
  Anesthesia Post-op Note  Patient: Vicki Hernandez  Procedure(s) Performed: Procedure(s) (LRB): ENDARTERECTOMY CAROTID (Left)  Patient Location: PACU  Anesthesia Type: General  Level of Consciousness: awake, alert  and oriented  Airway and Oxygen Therapy: Patient Spontanous Breathing and Patient connected to nasal cannula oxygen  Post-op Pain: mild  Post-op Assessment: Post-op Vital signs reviewed, Patient's Cardiovascular Status Stable, Respiratory Function Stable and Patent Airway  Post-op Vital Signs: Reviewed and stable  Complications: No apparent anesthesia complications

## 2011-03-25 NOTE — Progress Notes (Signed)
03/25/2011 3:09 PM Patient arrived from PACU, received report from [place]. VSS. Surgical site is clean dry and intact.neuro intact.no complaints of pain. Oriented to unit and equipment. Call bell with in reach. Will continue to monitor. Celesta Gentile

## 2011-03-25 NOTE — Progress Notes (Signed)
Report to Phillip RN as caregiver 

## 2011-03-25 NOTE — H&P (View-Only) (Signed)
[facility] [place]   Patient name: Vicki Hernandez MRN: [id number] DOB: 02/20/1928 Sex: female   Referred by: Allan Ross  Reason for referral:  Chief Complaint  Patient presents with  . New Evaluation    Eval for carotid Endarterectomy  Ref. Dr. Varnanasi    HISTORY OF PRESENT ILLNESS: The patient comes in today for evaluation of her carotid disease. A left carotid bruit was recently detected. This was followed up with a carotid ultrasound revealing significant left-sided stenosis. The patient reports that for the past year and a half she's had difficulty with word finding. There has been no change in the symptoms recently. She has macular degeneration which is being followed at [facility]. She denies symptoms of amaurosis fugax. She denies numbness or weakness in either extremity. She does report last week that after taking a muscle relaxer for hip pain she had slurred speech confusion and dizziness several hours after the medication was administered. Other than that she does not endorse any neurological symptoms.  The patient is medically managed for hypercholesterolemia and hypertension. She does have a history of an irregular heartbeat. She is maintained only on aspirin which was recently started. She had a normal stress test several years ago.  Past Medical History  Diagnosis Date  . Hypertension   . Arthritis   . GERD (gastroesophageal reflux disease)   . Hyperlipidemia   . Macular degeneration   . Accessory kidney   . Carotid artery bruit     Past Surgical History  Procedure Date  . Spine surgery 2002, 2010    Lumbar disk/ spine surgeries X 2   By Dr. Brooks  . Abdominal hysterectomy     TAH w/ BSO  . Hemorrhoid surgery   . Knee surgery   . Joint replacement 2012    Left Knee    History   Social History  . Marital Status: Married    Spouse Name: N/A    Number of Children: N/A  . Years of Education: N/A   Occupational  History  . Not on file.   Social History Main Topics  . Smoking status: Current Everyday Smoker -- 0.5 packs/day  . Smokeless tobacco: Not on file  . Alcohol Use: Yes     social  . Drug Use: No  . Sexually Active:    Other Topics Concern  . Not on file   Social History Narrative  . No narrative on file    Family History  Problem Relation Age of Onset  . Hyperlipidemia Father   . Hypertension Father   . Heart disease Father     Heart Disease before age 60  . Heart attack Father     Allergies as of 03/21/2011 - Review Complete 03/21/2011  Allergen Reaction Noted  . Codeine  03/16/2011  . Prevacid  03/16/2011  . Sulfa antibiotics  03/16/2011    Current Outpatient Prescriptions on File Prior to Visit  Medication Sig Dispense Refill  . aspirin 81 MG tablet Take 81 mg by mouth daily.      . losartan-hydrochlorothiazide (HYZAAR) 50-12.5 MG per tablet Take 1 tablet by mouth daily.      . omeprazole (PRILOSEC) 20 MG capsule Take 20 mg by mouth daily.      . pregabalin (LYRICA) 50 MG capsule Take 50 mg by mouth 2 (two) times daily.      . Sennosides (SENOKOT PO) Take by mouth.      . simvastatin (ZOCOR)   40 MG tablet Take 40 mg by mouth every evening.      . traMADol (ULTRAM) 50 MG tablet Take 50 mg by mouth every 6 (six) hours as needed.      . VITAMIN D, ERGOCALCIFEROL, PO Take by mouth.      . Multiple Vitamins-Minerals (EYE-VITES PO) Take by mouth.         REVIEW OF SYSTEMS: Cardiovascular: Positive for chest pain chest pressure and palpitations Pulmonary: No productive cough, asthma or wheezing. Neurologic: See history of present illness Hematologic: No bleeding problems or clotting disorders. Musculoskeletal: No joint pain or joint swelling. Gastrointestinal: No blood in stool or hematemesis Genitourinary: No dysuria or hematuria. Psychiatric:: No history of major depression. Integumentary: No rashes or ulcers. Constitutional: No fever or chills.  PHYSICAL  EXAMINATION: General: The patient appears their stated age.  Vital signs are BP 114/49  Pulse 72  Resp 16  Ht 5' 3" (1.6 m)  Wt 128 lb (58.06 kg)  BMI 22.67 kg/m2  SpO2 95% HEENT:  No gross abnormalities Pulmonary: Respirations are non-labored Abdomen: Soft and non-tender  Musculoskeletal: There are no major deformities.   Neurologic: No focal weakness or paresthesias are detected, cranial nerves II through XII are grossly intact Skin: There are no ulcer or rashes noted. Psychiatric: The patient has normal affect. Cardiovascular: There is a regular rate and rhythm without significant murmur appreciated. Left carotid bruit  Diagnostic Studies: Ultrasound was repeated and reviewed today he this shows 80-99% left carotid stenosis and 1-39% right carotid stenosis the bifurcation was noted to high    Medication Changes: None  Assessment:  Left carotid stenosis,? Symptomatic Plan: We discussed options for management including medical treatment, carotid stenting, and carotid endarterectomy. Based on the patient's overall health we have recommended proceeding with left carotid endarterectomy. The patient wishes this to be performed by Dr. Lawson who is seen and evaluated the patient. I discussed the risks and benefits of the operation. We discussed the risk of nerve injury the risk of stroke. All of her questions were answered today her operation is going to be performed by Dr. Lawson on March 8     V. Wells Yissel Habermehl IV, M.D. [facility] [place] Office: [phone] Pager:  [phone]   

## 2011-03-25 NOTE — Discharge Summary (Signed)
[facility] Discharge Summary  Vicki Hernandez 1928-02-15 78 y.o. female  [id number]  Admission Date: 03/25/2011  Discharge Date: 03/26/11  Physician: Pryor Ochoa, MD  Admission Diagnosis: LEFT ICA STENOSIS   HPI:   This is a 78 y.o. female who comes in today for evaluation of her carotid disease. A left carotid bruit was recently detected. This was followed up with a carotid ultrasound revealing significant left-sided stenosis. The patient reports that for the past year and a half she's had difficulty with word finding. There has been no change in the symptoms recently. She has macular degeneration which is being followed at [facility]. She denies symptoms of amaurosis fugax. She denies numbness or weakness in either extremity. She does report last week that after taking a muscle relaxer for hip pain she had slurred speech confusion and dizziness several hours after the medication was administered. Other than that she does not endorse any neurological symptoms.  The patient is medically managed for hypercholesterolemia and hypertension. She does have a history of an irregular heartbeat. She is maintained only on aspirin which was recently started. She had a normal stress test several years ago.   Hospital Course:  The patient was admitted to the hospital and taken to the operating room on 03/25/2011 and underwent left carotid endarterectomy.  The pt tolerated the procedure well and was transported to the PACU in good condition.   By POD 1, the pt neuro status was in tact without deficit.  Overnight, she did have chest pain and an EKG and cardiac enzyme panel was obtained.  The EKG had no acute changes on it and was compared to the previous day.  Her cardiac enzymes were within normal limits.  She states that she has terrible GERD and this was relieved by sitting up.    The remainder of the hospital course consisted of increasing ambulation and increasing  intake of solids without difficulty.    Basename 03/24/11 1551  NA 139  K 3.9  CL 100  CO2 31  GLUCOSE 80  BUN 21  CALCIUM 9.5    Basename 03/24/11 1551  WBC 9.4  HGB 13.3  HCT 40.0  PLT 217    Basename 03/24/11 1551  INR 0.99     Discharge Instructions:   The patient is discharged to home with extensive instructions on wound care and progressive ambulation.  They are instructed not to drive or perform any heavy lifting until returning to see the physician in his office.  Discharge Orders    Future Orders Please Complete By Expires   Resume previous diet      Driving Restrictions      Comments:   No driving for 2 weeks and while taking pain medication   Lifting restrictions      Comments:   No lifting for 6 weeks   Call MD for:  temperature >100.5      Call MD for:  redness, tenderness, or signs of infection (pain, swelling, bleeding, redness, odor or green/yellow discharge around incision site)      Call MD for:  severe or increased pain, loss or decreased feeling  in affected limb(s)      may wash over wound with mild soap and water      Scheduling Instructions:   Shower daily with soap and water starting 03/27/11    CAROTID Sugery: Call MD for difficulty swallowing or speaking; weakness in arms or legs that is a new  symtom; severe headache.  If you have increased swelling in the neck and/or  are having difficulty breathing, CALL 911         Discharge Diagnosis:  LEFT ICA STENOSIS  Secondary Diagnosis: Patient Active Problem List  Diagnoses  . Occlusion and stenosis of carotid artery without mention of cerebral infarction   Past Medical History  Diagnosis Date  . Hypertension   . GERD (gastroesophageal reflux disease)   . Hyperlipidemia   . Macular degeneration   . Carotid artery bruit   . Complication of anesthesia     TAKES AWHILE TO WAKE UP   . Heart murmur   . Dysrhythmia     TACHYCARDIA   . Angina     3 WEEKS  . Accessory kidney   . H/O  hiatal hernia   . Arthritis     Lania, Zawistowski  Home Medication Instructions ZOX:[id number]   Printed on:03/25/11 1222  Medication Information                    VITAMIN D, ERGOCALCIFEROL, PO Take 1 capsule by mouth every morning.            Multiple Vitamins-Minerals (EYE-VITES PO) Take 1 tablet by mouth every morning.            omeprazole (PRILOSEC) 20 MG capsule Take 20 mg by mouth every morning.            losartan-hydrochlorothiazide (HYZAAR) 50-12.5 MG per tablet Take 1 tablet by mouth every morning.            traMADol (ULTRAM) 50 MG tablet Take 50 mg by mouth every 6 (six) hours as needed. For pain           pregabalin (LYRICA) 50 MG capsule Take 50 mg by mouth at bedtime.            simvastatin (ZOCOR) 40 MG tablet Take 40 mg by mouth every evening.           aspirin 81 MG tablet Take 81 mg by mouth every morning.            senna (SENOKOT) 8.6 MG tablet Take 1 tablet by mouth every morning.           Polyethyl Glycol-Propyl Glycol (SYSTANE OP) Place 1-2 drops into both eyes daily as needed. For dry eyes           traMADol (ULTRAM) 50 MG tablet Take 1 tablet (50 mg total) by mouth every 6 (six) hours as needed for pain. #30 NR            Disposition: home  Patient's condition: is Good  Follow up: 1. Dr.  Hart Rochester in 2 weeks.   Newton Pigg, PA-C [facility] [phone] 03/25/2011  12:22 PM

## 2011-03-25 NOTE — Plan of Care (Signed)
Problem: Consults Goal: Diagnosis CEA/CES/AAA Stent Carotid Endarterectomy (CEA)     

## 2011-03-25 NOTE — Transfer of Care (Signed)
Immediate Anesthesia Transfer of Care Note  Patient: Vicki Hernandez  Procedure(s) Performed: Procedure(s) (LRB): ENDARTERECTOMY CAROTID (Left)  Patient Location: PACU  Anesthesia Type: General  Level of Consciousness: awake, alert , oriented and patient cooperative  Airway & Oxygen Therapy: Patient Spontanous Breathing and Patient connected to face mask oxygen  Post-op Assessment: Report given to PACU RN, Post -op Vital signs reviewed and stable, Patient moving all extremities X 4 and Patient able to stick tongue midline  Post vital signs: Reviewed and stable  Complications: No apparent anesthesia complications

## 2011-03-25 NOTE — Progress Notes (Signed)
MEDICATION RELATED CONSULT NOTE - INITIAL   Pharmacy Consult:  Renal Adjustment of Antibiotic  Allergies  Allergen Reactions  . Codeine Nausea Only  . Prevacid   . Sulfa Antibiotics Other (See Comments)    Unknown reaction    Patient Measurements: Height: 5\' 3"  (160 cm) Weight: 132 lb 0.9 oz (59.9 kg) IBW/kg (Calculated) : 52.4   Vital Signs: Temp: 98.6 F (37 C) (03/08 1503) Temp src: Oral (03/08 1503) BP: 137/56 mmHg (03/08 1503) Pulse Rate: 72  (03/08 1503)  Labs:  Basename 03/24/11 1551  WBC 9.4  HGB 13.3  HCT 40.0  PLT 217  APTT 34  CREATININE 1.08  LABCREA --  CREATININE 1.08  CREAT24HRUR --  MG --  PHOS --  ALBUMIN 3.5  PROT 7.2  ALBUMIN 3.5  AST 16  ALT 9  ALKPHOS 71  BILITOT 0.3  BILIDIR --  IBILI --   Estimated Creatinine Clearance: 33.8 ml/min (by C-G formula based on Cr of 1.08).   Microbiology: Recent Results (from the past 720 hour(s))  SURGICAL PCR SCREEN     Status: Normal   Collection Time   03/24/11  3:55 PM      Component Value Range Status Comment   MRSA, PCR NEGATIVE  NEGATIVE  Final    Staphylococcus aureus NEGATIVE  NEGATIVE  Final     Medical History: Past Medical History  Diagnosis Date  . Hypertension   . GERD (gastroesophageal reflux disease)   . Hyperlipidemia   . Macular degeneration   . Carotid artery bruit   . Complication of anesthesia     TAKES AWHILE TO WAKE UP   . Heart murmur   . Dysrhythmia     TACHYCARDIA   . Angina     3 WEEKS  . Accessory kidney   . H/O hiatal hernia   . Arthritis     OSTEO     Assessment: 29 YOF s/p carotid endarterectomy to received cefuroxime 1.5mg  IV Q12H x 2 doses.  Pharmacy asked to renally adjustment abx if needed.  Patient's renal function appropriate for current dosing.  Goal of Therapy:  Appropriate dosing  Plan:  - Continue cefuroxime without change. - Thank you for the consult!   - Pharmacy will sign off and please re-consult if new abx starts.   Phillips Climes,  PharmD 03/25/2011,3:33 PM

## 2011-03-26 LAB — CBC
HCT: 33.6 % — ABNORMAL LOW (ref 36.0–46.0)
Hemoglobin: 11 g/dL — ABNORMAL LOW (ref 12.0–15.0)
MCH: 30.1 pg (ref 26.0–34.0)
MCHC: 32.7 g/dL (ref 30.0–36.0)
MCV: 92.1 fL (ref 78.0–100.0)
Platelets: 153 10*3/uL (ref 150–400)
RBC: 3.65 MIL/uL — ABNORMAL LOW (ref 3.87–5.11)
RDW: 15.5 % (ref 11.5–15.5)
WBC: 6.9 10*3/uL (ref 4.0–10.5)

## 2011-03-26 LAB — BASIC METABOLIC PANEL
BUN: 12 mg/dL (ref 6–23)
CO2: 29 mEq/L (ref 19–32)
Calcium: 8.6 mg/dL (ref 8.4–10.5)
Chloride: 104 mEq/L (ref 96–112)
Creatinine, Ser: 0.92 mg/dL (ref 0.50–1.10)
GFR calc Af Amer: 66 mL/min — ABNORMAL LOW (ref >90)
GFR calc non Af Amer: 57 mL/min — ABNORMAL LOW (ref >90)
Glucose, Bld: 120 mg/dL — ABNORMAL HIGH (ref 70–99)
Potassium: 3.9 mEq/L (ref 3.5–5.1)
Sodium: 139 mEq/L (ref 135–145)

## 2011-03-26 LAB — CARDIAC PANEL(CRET KIN+CKTOT+MB+TROPI)
CK, MB: 1.7 ng/mL (ref 0.3–4.0)
Relative Index: 1.6 (ref 0.0–2.5)
Total CK: 109 U/L (ref 7–177)
Troponin I: <0.3 ng/mL (ref <0.30)

## 2011-03-26 NOTE — Progress Notes (Addendum)
[facility] Progress Note  03/26/2011 7:29 AM POD 1  Subjective:  States she had chest pain last pm, but thinks it was heartburn.  Sitting up in bed relieved it.   EKG was done.  No CP since.  Tm 99  HR 70-80s reg  94%RA  140s-160s sys Filed Vitals:   03/26/11 0400  BP: 124/47  Pulse: 78  Temp: 99 F (37.2 C)  Resp: 17     Physical Exam: Neuro:  In tact without deficit. Incision:  C/d/i.  Mild edema around incision.   CBC    Component Value Date/Time   WBC 6.9 03/26/2011 0324   RBC 3.65* 03/26/2011 0324   HGB 11.0* 03/26/2011 0324   HCT 33.6* 03/26/2011 0324   PLT 153 03/26/2011 0324   MCV 92.1 03/26/2011 0324   MCH 30.1 03/26/2011 0324   MCHC 32.7 03/26/2011 0324   RDW 15.5 03/26/2011 0324   LYMPHSABS 3.9 03/24/2011 1551   MONOABS 0.6 03/24/2011 1551   EOSABS 0.4 03/24/2011 1551   BASOSABS 0.0 03/24/2011 1551    BMET    Component Value Date/Time   NA 139 03/26/2011 0324   K 3.9 03/26/2011 0324   CL 104 03/26/2011 0324   CO2 29 03/26/2011 0324   GLUCOSE 120* 03/26/2011 0324   BUN 12 03/26/2011 0324   CREATININE 0.92 03/26/2011 0324   CALCIUM 8.6 03/26/2011 0324   GFRNONAA 57* 03/26/2011 0324   GFRAA 66* 03/26/2011 0324     Intake/Output Summary (Last 24 hours) at 03/26/11 0730 Last data filed at 03/26/11 0400  Gross per 24 hour  Intake   2740 ml  Output   1600 ml  Net   1140 ml      Assessment/Plan:  This is a 78 y.o. female who is s/p left CEA POD 1 -acute surgical blood loss anemia-pt tolerating. -CP last pm.  Cardiac enzymes were normal.  Probably GERD.  -EKG revealed no acute changes and compared to previous day EKG. Home later today after ambulating.  Newton Pigg, PA-C [facility] [phone]  Addendum  I have independently interviewed and examined the patient, and I agree with the physician assistant's findings.  Minimal superficial hematoma, neuro intact.  May D/C home  Leonides Sake, MD [facility]  [place] Office: [phone] Pager: [phone]  03/26/2011, 9:48 AM

## 2011-03-26 NOTE — Progress Notes (Signed)
Pt discharged home with family per MD order. All discharge instructions reviewed and all questions answered. Pt belongings sent home with family.

## 2011-03-28 ENCOUNTER — Encounter (HOSPITAL_COMMUNITY): Payer: Self-pay | Admitting: Vascular Surgery

## 2011-03-28 NOTE — Procedures (Unsigned)
CAROTID DUPLEX EXAM  INDICATION:  Carotid stenosis.  HISTORY: Diabetes:  No. Cardiac:  Arrhythmia. Hypertension:  Yes. Smoking:  Currently. Previous Surgery:  No carotid intervention. CV History:  Currently asymptomatic with history of macular degeneration and loss of vision. Amaurosis Fugax No, Paresthesias No, Hemiparesis No.                                      RIGHT             LEFT Brachial systolic pressure:         118               126 Brachial Doppler waveforms:         WNL               WNL Vertebral direction of flow:        Antegrade         Antegrade DUPLEX VELOCITIES (cm/sec) CCA peak systolic                   75                67 ECA peak systolic                   89                186 ICA peak systolic                   91                378 ICA end diastolic                   23                126 PLAQUE MORPHOLOGY:                  Calcified         Calcified PLAQUE AMOUNT:                      Mild              Severe PLAQUE LOCATION:                    CCA/ICA           CCA/ICA/ECA  IMPRESSION: 1. Right internal carotid artery stenosis present in the 1% to 39%     range. 2. Right external carotid artery is patent. 3. Left internal carotid artery stenosis present in the 80% to 99%     range. 4. Left external carotid artery stenosis present. 5. Bilateral vertebral arteries are patent and antegrade.  ___________________________________________ V. Charlena Cross, MD  SH/MEDQ  D:  03/21/2011  T:  03/21/2011  Job:  [id number]

## 2011-04-04 ENCOUNTER — Encounter: Payer: Self-pay | Admitting: Vascular Surgery

## 2011-04-05 ENCOUNTER — Ambulatory Visit (INDEPENDENT_AMBULATORY_CARE_PROVIDER_SITE_OTHER): Payer: Medicare Other | Admitting: Vascular Surgery

## 2011-04-05 ENCOUNTER — Encounter: Payer: Self-pay | Admitting: Vascular Surgery

## 2011-04-05 VITALS — BP 118/52 | HR 76 | Temp 98.5°F | Resp 16 | Ht 63.0 in | Wt 128.0 lb

## 2011-04-05 DIAGNOSIS — I6529 Occlusion and stenosis of unspecified carotid artery: Secondary | ICD-10-CM

## 2011-04-05 DIAGNOSIS — R0989 Other specified symptoms and signs involving the circulatory and respiratory systems: Secondary | ICD-10-CM

## 2011-04-05 NOTE — Progress Notes (Signed)
Subjective:     Patient ID: Vicki Hernandez, female   DOB: 1928/03/20, 78 y.o.   MRN: [id number]  HPI this 78 year old female returns for initial followup 2 weeks post left carotid endarterectomy for severe left internal carotid stenosis. She had suffered a left brain TIA preoperatively. This consisted of some slurred speech and dizziness and confusion which resolved. She's had no neurologic symptoms since her surgery. She is swallowing well and has had no hoarseness.  Review of Systems     Objective:   Physical ExamBP 118/52  Pulse 76  Temp(Src) 98.5 F (36.9 C) (Oral)  Resp 16  Ht 5\' 3"  (1.6 m)  Wt 128 lb (58.06 kg)  BMI 22.67 kg/m2  SpO2 98%  General well-developed well-nourished elderly female in no apparent distress Neurologic exam normal Left neck incision well healed with 3+ carotid pulse no audible bruit Lungs no rhonchi or wheezing     Assessment:     Doing well post left carotid endarterectomy with preop left brain TIA-no further neurologic symptoms    Plan:     Return in 6 months for followup carotid duplex exam and to see me If further neurologic symptoms occur in the interim we'll be in touch

## 2011-04-05 NOTE — Progress Notes (Signed)
Addended by: Sharee Pimple on: 04/05/2011 10:46 AM   Modules accepted: Orders

## 2011-04-05 NOTE — Discharge Summary (Signed)
Already done

## 2011-05-04 ENCOUNTER — Other Ambulatory Visit: Payer: PRIVATE HEALTH INSURANCE

## 2011-05-04 ENCOUNTER — Encounter: Payer: PRIVATE HEALTH INSURANCE | Admitting: Vascular Surgery

## 2011-06-24 ENCOUNTER — Other Ambulatory Visit (HOSPITAL_COMMUNITY): Payer: Self-pay | Admitting: Orthopedic Surgery

## 2011-06-24 DIAGNOSIS — M25562 Pain in left knee: Secondary | ICD-10-CM

## 2011-07-05 ENCOUNTER — Other Ambulatory Visit (HOSPITAL_COMMUNITY): Payer: Medicare Other

## 2011-07-05 ENCOUNTER — Encounter (HOSPITAL_COMMUNITY): Payer: Medicare Other

## 2011-10-17 ENCOUNTER — Encounter: Payer: Self-pay | Admitting: Vascular Surgery

## 2011-10-18 ENCOUNTER — Ambulatory Visit (INDEPENDENT_AMBULATORY_CARE_PROVIDER_SITE_OTHER): Payer: Medicare Other | Admitting: Vascular Surgery

## 2011-10-18 ENCOUNTER — Encounter: Payer: Self-pay | Admitting: Vascular Surgery

## 2011-10-18 VITALS — BP 124/72 | HR 70 | Resp 16 | Ht 63.0 in | Wt 130.0 lb

## 2011-10-18 DIAGNOSIS — I6529 Occlusion and stenosis of unspecified carotid artery: Secondary | ICD-10-CM

## 2011-10-18 DIAGNOSIS — Z48812 Encounter for surgical aftercare following surgery on the circulatory system: Secondary | ICD-10-CM

## 2011-10-18 DIAGNOSIS — R0989 Other specified symptoms and signs involving the circulatory and respiratory systems: Secondary | ICD-10-CM

## 2011-10-18 NOTE — Addendum Note (Signed)
Addended by: Sharee Pimple on: 10/18/2011 10:50 AM   Modules accepted: Orders

## 2011-10-18 NOTE — Progress Notes (Signed)
Carotid duplex performed @ [facility] 10/18/2011 

## 2011-10-18 NOTE — Progress Notes (Signed)
Subjective:     Patient ID: Vicki Hernandez, female   DOB: 1928-06-21, 78 y.o.   MRN: [id number]  HPI this 78 year old female returns for six-month followup regarding her left carotid endarterectomy performed by me in March of 2013. This was done following a left brain TIA which consisted of some garbled speech and dizziness. These symptoms have not recurred. She has no history of stroke. She denies any new neurologic symptoms. She is swallowing well and has no hoarseness. She takes one aspirin per day  Past Medical History  Diagnosis Date  . Hypertension   . GERD (gastroesophageal reflux disease)   . Hyperlipidemia   . Macular degeneration   . Carotid artery bruit   . Complication of anesthesia     TAKES AWHILE TO WAKE UP   . Heart murmur   . Dysrhythmia     TACHYCARDIA   . Angina     3 WEEKS  . Accessory kidney   . H/O hiatal hernia   . Arthritis     OSTEO     History  Substance Use Topics  . Smoking status: Current Every Day Smoker -- 0.5 packs/day  . Smokeless tobacco: Not on file  . Alcohol Use: Yes     social    Family History  Problem Relation Age of Onset  . Hyperlipidemia Father   . Hypertension Father   . Heart disease Father     Heart Disease before age 40  . Heart attack Father     Allergies  Allergen Reactions  . Codeine Nausea Only  . Lansoprazole   . Sulfa Antibiotics Other (See Comments)    Unknown reaction    Current outpatient prescriptions:aspirin 81 MG tablet, Take 81 mg by mouth every morning. , Disp: , Rfl: ;  losartan-hydrochlorothiazide (HYZAAR) 50-12.5 MG per tablet, Take 1 tablet by mouth every morning. , Disp: , Rfl: ;  Multiple Vitamins-Minerals (EYE-VITES PO), Take 1 tablet by mouth every morning. , Disp: , Rfl: ;  omeprazole (PRILOSEC) 20 MG capsule, Take 20 mg by mouth every morning. , Disp: , Rfl:  Polyethyl Glycol-Propyl Glycol (SYSTANE OP), Place 1-2 drops into both eyes daily as needed. For dry eyes, Disp: , Rfl: ;  pregabalin  (LYRICA) 50 MG capsule, Take 50 mg by mouth at bedtime. , Disp: , Rfl: ;  senna (SENOKOT) 8.6 MG tablet, Take 1 tablet by mouth every morning., Disp: , Rfl: ;  simvastatin (ZOCOR) 40 MG tablet, Take 40 mg by mouth every evening., Disp: , Rfl:  traMADol (ULTRAM) 50 MG tablet, Take 50 mg by mouth every 6 (six) hours as needed. For pain, Disp: , Rfl: ;  VITAMIN D, ERGOCALCIFEROL, PO, Take 1 capsule by mouth every morning. , Disp: , Rfl:   BP 124/72  Pulse 70  Resp 16  Ht 5\' 3"  (1.6 m)  Wt 130 lb (58.968 kg)  BMI 23.03 kg/m2  SpO2 98%  Body mass index is 23.03 kg/(m^2).          Review of Systems denies chest pain but does have mild dyspnea on exertion. Complains of severe left knee pain has had previous left knee replacement. Occasional reflux esophagitis. No claudication symptoms or hemoptysis. Other systems negative and complete review of systems     Objective:   Physical Exam blood pressure 124/72 heart rate 70 respirations 16 Gen.-alert and oriented x3 in no apparent distress HEENT normal for age Lungs no rhonchi or wheezing Cardiovascular regular rhythm no murmurs carotid pulses 3+  palpable no bruits audible Abdomen soft nontender no palpable masses Musculoskeletal free of  major deformities Skin clear -no rashes Neurologic normal Lower extremities 3+ femoral and dorsalis pedis pulses palpable bilaterally with no edema  Today I ordered a carotid duplex exam which are reviewed and interpreted. There is no restenosis of the left carotid endarterectomy site. There is minimal right internal carotid stenosis at the present time.    Assessment:    doing well 6 months post left carotid endarterectomy for severe left internal carotid stenosis with history of left brain TIA's-no recurrence of symptoms    Plan:     Return in 6 months for carotid duplex exam and see nurse practitioner unless develops any symptoms in the interim-continue daily aspirin therapy

## 2011-10-20 DIAGNOSIS — H53009 Unspecified amblyopia, unspecified eye: Secondary | ICD-10-CM | POA: Insufficient documentation

## 2011-10-20 DIAGNOSIS — Z961 Presence of intraocular lens: Secondary | ICD-10-CM | POA: Insufficient documentation

## 2011-10-20 DIAGNOSIS — H353190 Nonexudative age-related macular degeneration, unspecified eye, stage unspecified: Secondary | ICD-10-CM | POA: Insufficient documentation

## 2012-01-18 DIAGNOSIS — I4891 Unspecified atrial fibrillation: Secondary | ICD-10-CM

## 2012-01-18 HISTORY — DX: Unspecified atrial fibrillation: I48.91

## 2012-04-17 ENCOUNTER — Other Ambulatory Visit (INDEPENDENT_AMBULATORY_CARE_PROVIDER_SITE_OTHER): Payer: Medicare Other | Admitting: *Deleted

## 2012-04-17 ENCOUNTER — Ambulatory Visit: Payer: Medicare Other | Admitting: Neurosurgery

## 2012-04-17 DIAGNOSIS — I6529 Occlusion and stenosis of unspecified carotid artery: Secondary | ICD-10-CM

## 2012-04-17 DIAGNOSIS — Z48812 Encounter for surgical aftercare following surgery on the circulatory system: Secondary | ICD-10-CM

## 2012-04-20 ENCOUNTER — Other Ambulatory Visit: Payer: Self-pay

## 2012-04-20 DIAGNOSIS — Z48812 Encounter for surgical aftercare following surgery on the circulatory system: Secondary | ICD-10-CM

## 2012-04-24 ENCOUNTER — Encounter: Payer: Self-pay | Admitting: Vascular Surgery

## 2012-08-20 ENCOUNTER — Other Ambulatory Visit: Payer: Self-pay | Admitting: Dermatology

## 2012-11-29 ENCOUNTER — Ambulatory Visit (INDEPENDENT_AMBULATORY_CARE_PROVIDER_SITE_OTHER): Payer: Medicare Other | Admitting: Interventional Cardiology

## 2012-11-29 ENCOUNTER — Encounter: Payer: Self-pay | Admitting: Interventional Cardiology

## 2012-11-29 ENCOUNTER — Encounter (INDEPENDENT_AMBULATORY_CARE_PROVIDER_SITE_OTHER): Payer: Self-pay

## 2012-11-29 VITALS — BP 119/50 | HR 68 | Wt 131.0 lb

## 2012-11-29 DIAGNOSIS — I10 Essential (primary) hypertension: Secondary | ICD-10-CM | POA: Insufficient documentation

## 2012-11-29 DIAGNOSIS — I498 Other specified cardiac arrhythmias: Secondary | ICD-10-CM

## 2012-11-29 DIAGNOSIS — I739 Peripheral vascular disease, unspecified: Secondary | ICD-10-CM

## 2012-11-29 DIAGNOSIS — I471 Supraventricular tachycardia: Secondary | ICD-10-CM | POA: Insufficient documentation

## 2012-11-29 DIAGNOSIS — R079 Chest pain, unspecified: Secondary | ICD-10-CM

## 2012-11-29 DIAGNOSIS — F17200 Nicotine dependence, unspecified, uncomplicated: Secondary | ICD-10-CM | POA: Insufficient documentation

## 2012-11-29 NOTE — Progress Notes (Signed)
Patient ID: Vicki Hernandez, female   DOB: 05-30-1928, 79 y.o.   MRN: [id number]    [address] [place], [place]  [address] Phone: [phone] Fax:  [phone]  Date:  11/29/2012   ID:  Vicki Hernandez, DOB [birth year], MRN [id number]  PCP:   Duane Lope, MD      History of Present Illness: Vicki Hernandez is a 79 y.o. female who has had CEA. She had a rapid pulse a few months ago while at the vascular surgeon. She was sent here but her HR resolved prior to ECG. She wore a monitor. She did not have any palpitations at that time while wearing the monitor. She had only PACs. She has been taken to the hospital years ago for this. She has used a beta blocker as needed. She thinks she may have had 3 episodes per year. Episodes can last up to an hour. Palpitations:  feels very tired after episode of palpitations.  Last week had a 45 minute episode of palpitations.  She lied down and took a metoprolol.  Mild dizziness, no syncope.  She is active but does not do any prolonged walking.      Wt Readings from Last 3 Encounters:  11/29/12 131 lb (59.421 kg)  10/18/11 130 lb (58.968 kg)  04/05/11 128 lb (58.06 kg)     Past Medical History  Diagnosis Date  . Hypertension   . GERD (gastroesophageal reflux disease)   . Hyperlipidemia   . Macular degeneration   . Carotid artery bruit   . Complication of anesthesia     TAKES AWHILE TO WAKE UP   . Heart murmur   . Dysrhythmia     TACHYCARDIA   . Angina     3 WEEKS  . Accessory kidney   . H/O hiatal hernia   . Arthritis     OSTEO     Current Outpatient Prescriptions  Medication Sig Dispense Refill  . aspirin 81 MG tablet Take 81 mg by mouth every morning.       Marland Kitchen esomeprazole (NEXIUM) 20 MG capsule Take 20 mg by mouth daily at 12 noon.      Marland Kitchen losartan-hydrochlorothiazide (HYZAAR) 50-12.5 MG per tablet Take 1 tablet by mouth every morning.       . Multiple Vitamins-Minerals (EYE-VITES PO) Take 1 tablet by mouth  every morning.       Bertram Gala Glycol-Propyl Glycol (SYSTANE OP) Place 1-2 drops into both eyes daily as needed. For dry eyes      . pregabalin (LYRICA) 50 MG capsule Take 50 mg by mouth at bedtime.       . senna (SENOKOT) 8.6 MG tablet Take 1 tablet by mouth every morning.      . simvastatin (ZOCOR) 40 MG tablet Take 40 mg by mouth every evening.      . traMADol (ULTRAM) 50 MG tablet Take 50 mg by mouth every 6 (six) hours as needed. For pain      . VITAMIN D, ERGOCALCIFEROL, PO Take 1 capsule by mouth every morning.        No current facility-administered medications for this visit.    Allergies:    Allergies  Allergen Reactions  . Codeine Nausea Only  . Lansoprazole   . Sulfa Antibiotics Other (See Comments)    Unknown reaction    Social History:  The patient  reports that she has been smoking.  She does not have any smokeless tobacco history  on file. She reports that she drinks alcohol. She reports that she does not use illicit drugs.   Family History:  The patient's family history includes Heart attack in her father; Heart disease in her father; Hyperlipidemia in her father; Hypertension in her father.   ROS:  Please see the history of present illness.  No nausea, vomiting.  No fevers, chills.  No focal weakness.  No dysuria. Palpitations, chest burning during the episode of rapid heartbeat.   All other systems reviewed and negative.   PHYSICAL EXAM: VS:  BP 119/50  Pulse 68  Wt 131 lb (59.421 kg) Well nourished, well developed, in no acute distress HEENT: normal Neck: no JVD, no carotid bruits Cardiac:  normal S1, S2; RRR; 2/6 systolic murmur Lungs:  clear to auscultation bilaterally, no wheezing, rhonchi or rales Abd: soft, nontender, no hepatomegaly Ext: no edema Skin: warm and dry Neuro:   no focal abnormalities noted       ASSESSMENT AND PLAN:  1. Chest pain: Chest burning that occurred when her palpitations were happening. Given her history of peripheral  vascular disease, would rule out coronary ischemia. We'll plan for pharmacologic stress test and she would be unable to walk on the treadmill.  2. SVT: She has had prior SVT documented at the vascular surgeon's office. Unfortunately, an ECG was not captured at that time. Therefore, we do not know exactly what the rhythm is. It comes on suddenly and stops suddenly.  Prior monitor was unrevealing, showing only PACs. She and I discussed EP evaluation. She does not feel that her symptoms are frequent enough to warrant an EP study. If her symptoms get more prolonged and more frequent, she will reconsider. She will continue the metoprolol as needed.  3. HTN: Controlled. Continue ARB. 4. Tobacco abuse: She needs to try to stop smoking. 5. PAD: History of carotid endarterectomy.  Signed, Fredric Mare, MD, [facility] 11/29/2012 9:36 AM

## 2012-11-29 NOTE — Patient Instructions (Signed)
Your physician has requested that you have a lexiscan myoview. For further information please visit [email or web address] Please follow instruction sheet, as given.  Your physician wants you to follow-up in: 6 months with Dr. Eldridge Dace. You will receive a reminder letter in the mail two months in advance. If you don't receive a letter, please call our office to schedule the follow-up appointment.  Your physician discussed the hazards of tobacco use. Tobacco use cessation is recommended and techniques and options to help you quit were discussed.   Smoking Cessation Quitting smoking is important to your health and has many advantages. However, it is not always easy to quit since nicotine is a very addictive drug. Often times, people try 3 times or more before being able to quit. This document explains the best ways for you to prepare to quit smoking. Quitting takes hard work and a lot of effort, but you can do it. ADVANTAGES OF QUITTING SMOKING  You will live longer, feel better, and live better.  Your body will feel the impact of quitting smoking almost immediately.  Within 20 minutes, blood pressure decreases. Your pulse returns to its normal level.  After 8 hours, carbon monoxide levels in the blood return to normal. Your oxygen level increases.  After 24 hours, the chance of having a heart attack starts to decrease. Your breath, hair, and body stop smelling like smoke.  After 48 hours, damaged nerve endings begin to recover. Your sense of taste and smell improve.  After 72 hours, the body is virtually free of nicotine. Your bronchial tubes relax and breathing becomes easier.  After 2 to 12 weeks, lungs can hold more air. Exercise becomes easier and circulation improves.  The risk of having a heart attack, stroke, cancer, or lung disease is greatly reduced.  After 1 year, the risk of coronary heart disease is cut in half.  After 5 years, the risk of stroke falls to the same as a  nonsmoker.  After 10 years, the risk of lung cancer is cut in half and the risk of other cancers decreases significantly.  After 15 years, the risk of coronary heart disease drops, usually to the level of a nonsmoker.  If you are pregnant, quitting smoking will improve your chances of having a healthy baby.  The people you live with, especially any children, will be healthier.  You will have extra money to spend on things other than cigarettes. QUESTIONS TO THINK ABOUT BEFORE ATTEMPTING TO QUIT You may want to talk about your answers with your caregiver.  Why do you want to quit?  If you tried to quit in the past, what helped and what did not?  What will be the most difficult situations for you after you quit? How will you plan to handle them?  Who can help you through the tough times? Your family? Friends? A caregiver?  What pleasures do you get from smoking? What ways can you still get pleasure if you quit? Here are some questions to ask your caregiver:  How can you help me to be successful at quitting?  What medicine do you think would be best for me and how should I take it?  What should I do if I need more help?  What is smoking withdrawal like? How can I get information on withdrawal? GET READY  Set a quit date.  Change your environment by getting rid of all cigarettes, ashtrays, matches, and lighters in your home, car, or work. Do not let people smoke  in your home.  Review your past attempts to quit. Think about what worked and what did not. GET SUPPORT AND ENCOURAGEMENT You have a better chance of being successful if you have help. You can get support in many ways.  Tell your family, friends, and co-workers that you are going to quit and need their support. Ask them not to smoke around you.  Get individual, group, or telephone counseling and support. Programs are available at [employer] and health centers. Call your local health department for information  about programs in your area.  Spiritual beliefs and practices may help some smokers quit.  Download a "quit meter" on your computer to keep track of quit statistics, such as how long you have gone without smoking, cigarettes not smoked, and money saved.  Get a self-help book about quitting smoking and staying off of tobacco. LEARN NEW SKILLS AND BEHAVIORS  Distract yourself from urges to smoke. Talk to someone, go for a walk, or occupy your time with a task.  Change your normal routine. Take a different route to work. Drink tea instead of coffee. Eat breakfast in a different place.  Reduce your stress. Take a hot bath, exercise, or read a book.  Plan something enjoyable to do every day. Reward yourself for not smoking.  Explore interactive web-based programs that specialize in helping you quit. GET MEDICINE AND USE IT CORRECTLY Medicines can help you stop smoking and decrease the urge to smoke. Combining medicine with the above behavioral methods and support can greatly increase your chances of successfully quitting smoking.  Nicotine replacement therapy helps deliver nicotine to your body without the negative effects and risks of smoking. Nicotine replacement therapy includes nicotine gum, lozenges, inhalers, nasal sprays, and skin patches. Some may be available over-the-counter and others require a prescription.  Antidepressant medicine helps people abstain from smoking, but how this works is unknown. This medicine is available by prescription.  Nicotinic receptor partial agonist medicine simulates the effect of nicotine in your brain. This medicine is available by prescription. Ask your caregiver for advice about which medicines to use and how to use them based on your health history. Your caregiver will tell you what side effects to look out for if you choose to be on a medicine or therapy. Carefully read the information on the package. Do not use any other product containing nicotine  while using a nicotine replacement product.  RELAPSE OR DIFFICULT SITUATIONS Most relapses occur within the first 3 months after quitting. Do not be discouraged if you start smoking again. Remember, most people try several times before finally quitting. You may have symptoms of withdrawal because your body is used to nicotine. You may crave cigarettes, be irritable, feel very hungry, cough often, get headaches, or have difficulty concentrating. The withdrawal symptoms are only temporary. They are strongest when you first quit, but they will go away within 10 14 days. To reduce the chances of relapse, try to:  Avoid drinking alcohol. Drinking lowers your chances of successfully quitting.  Reduce the amount of caffeine you consume. Once you quit smoking, the amount of caffeine in your body increases and can give you symptoms, such as a rapid heartbeat, sweating, and anxiety.  Avoid smokers because they can make you want to smoke.  Do not let weight gain distract you. Many smokers will gain weight when they quit, usually less than 10 pounds. Eat a healthy diet and stay active. You can always lose the weight gained after you quit.  Find ways to improve your mood other than smoking. FOR MORE INFORMATION  www.smokefree.gov  Document Released: 12/28/2000 Document Revised: 07/05/2011 Document Reviewed: 04/14/2011 [facility] Patient Information 2014 [place], [place].

## 2012-12-20 ENCOUNTER — Ambulatory Visit (HOSPITAL_COMMUNITY): Payer: Medicare Other | Attending: Cardiology | Admitting: Radiology

## 2012-12-20 ENCOUNTER — Encounter: Payer: Self-pay | Admitting: Cardiology

## 2012-12-20 VITALS — BP 159/68 | HR 64 | Ht 63.0 in | Wt 132.0 lb

## 2012-12-20 DIAGNOSIS — R002 Palpitations: Secondary | ICD-10-CM | POA: Insufficient documentation

## 2012-12-20 DIAGNOSIS — Z8249 Family history of ischemic heart disease and other diseases of the circulatory system: Secondary | ICD-10-CM | POA: Insufficient documentation

## 2012-12-20 DIAGNOSIS — E785 Hyperlipidemia, unspecified: Secondary | ICD-10-CM | POA: Insufficient documentation

## 2012-12-20 DIAGNOSIS — I739 Peripheral vascular disease, unspecified: Secondary | ICD-10-CM | POA: Insufficient documentation

## 2012-12-20 DIAGNOSIS — Z8673 Personal history of transient ischemic attack (TIA), and cerebral infarction without residual deficits: Secondary | ICD-10-CM | POA: Insufficient documentation

## 2012-12-20 DIAGNOSIS — F17200 Nicotine dependence, unspecified, uncomplicated: Secondary | ICD-10-CM | POA: Insufficient documentation

## 2012-12-20 DIAGNOSIS — I10 Essential (primary) hypertension: Secondary | ICD-10-CM | POA: Insufficient documentation

## 2012-12-20 DIAGNOSIS — R079 Chest pain, unspecified: Secondary | ICD-10-CM

## 2012-12-20 DIAGNOSIS — I779 Disorder of arteries and arterioles, unspecified: Secondary | ICD-10-CM | POA: Insufficient documentation

## 2012-12-20 MED ORDER — TECHNETIUM TC 99M SESTAMIBI GENERIC - CARDIOLITE
10.0000 | Freq: Once | INTRAVENOUS | Status: AC | PRN
  Administered 2012-12-20: 10 via INTRAVENOUS

## 2012-12-20 MED ORDER — TECHNETIUM TC 99M SESTAMIBI GENERIC - CARDIOLITE
30.0000 | Freq: Once | INTRAVENOUS | Status: AC | PRN
  Administered 2012-12-20: 30 via INTRAVENOUS

## 2012-12-20 MED ORDER — REGADENOSON 0.4 MG/5ML IV SOLN
0.4000 mg | Freq: Once | INTRAVENOUS | Status: AC
  Administered 2012-12-20: 0.4 mg via INTRAVENOUS

## 2012-12-20 NOTE — Progress Notes (Signed)
[facility] SITE 3 NUCLEAR MED [address] [place], [place] [address] [phone]    Cardiology Nuclear Med Study  Vicki Hernandez is a 79 y.o. female     MRN : [id number]     DOB: 1928-04-24  Procedure Date: 12/20/2012  Nuclear Med Background Indication for Stress Test:  Evaluation for Ischemia History:  Echo 2014 EF 65-70%, MPI 2009 (normal) Cardiac Risk Factors: Carotid Disease, CVA, Family History - CAD, Hypertension, Lipids, PVD and Smoker  Symptoms:  Palpitations   Nuclear Pre-Procedure Caffeine/Decaff Intake:  None NPO After: 7:00pm   Lungs:  clear O2 Sat: 95% on room air. IV 0.9% NS with Angio Cath:  22g  IV Site: R Hand  IV Started by:  Bonnita Levan, RN  Chest Size (in):  36 Cup Size: B  Height: 5\' 3"  (1.6 m)  Weight:  132 lb (59.875 kg)  BMI:  Body mass index is 23.39 kg/(m^2). Tech Comments:  N/A    Nuclear Med Study 1 or 2 day study: 1 day  Stress Test Type:  Lexiscan  Reading MD: Lance Muss, MD  Order Authorizing Provider:  Lance Muss, MD  Resting Radionuclide: Technetium 46m Sestamibi  Resting Radionuclide Dose: 11.0 mCi   Stress Radionuclide:  Technetium 3m Sestamibi  Stress Radionuclide Dose: 33.0 mCi           Stress Protocol Rest HR: 64 Stress HR: 77  Rest BP: 159/68 Stress BP: 133/69  Exercise Time (min): n/a METS: n/a           Dose of Adenosine (mg):  n/a Dose of Lexiscan: 0.4 mg  Dose of Atropine (mg): n/a Dose of Dobutamine: n/a mcg/kg/min (at max HR)  Stress Test Technologist: Nelson Chimes, BS-ES  Nuclear Technologist:  Domenic Polite, CNMT     Rest Procedure:  Myocardial perfusion imaging was performed at rest 45 minutes following the intravenous administration of Technetium 25m Sestamibi. Rest ECG: NSR with non-specific ST-T wave changes  Stress Procedure:  The patient received IV Lexiscan 0.4 mg over 15-seconds.  Technetium 65m Sestamibi injected at 30-seconds.  Quantitative spect images were obtained  after a 45 minute delay.  During the infusion of Lexiscan, patient complained of a flushed feeling, chest tightness and a headache.  Symptoms resolved in recovery with the exception of the headache.   Stress ECG: No significant change from baseline ECG  QPS Raw Data Images:  Normal; no motion artifact; normal heart/lung ratio. Stress Images:  Normal homogeneous uptake in all areas of the myocardium. Rest Images:  Normal homogeneous uptake in all areas of the myocardium. Subtraction (SDS):  No evidence of ischemia. Transient Ischemic Dilatation (Normal <1.22):  1.06 Lung/Heart Ratio (Normal <0.45):  0.29  Quantitative Gated Spect Images QGS EDV:  56 ml QGS ESV:  7ml  Impression Exercise Capacity:  Lexiscan with no exercise. BP Response:  Normal blood pressure response. Clinical Symptoms:  Atypical chest pain. ECG Impression:  No significant ST segment change suggestive of ischemia. Comparison with Prior Nuclear Study: No images to compare  Overall Impression:  Normal stress nuclear study.  LV Ejection Fraction: 87%.  LV Wall Motion:  NL LV Function; NL Wall Motion  Corky Crafts., MD, [facility]

## 2013-02-15 ENCOUNTER — Other Ambulatory Visit: Payer: Self-pay | Admitting: Vascular Surgery

## 2013-02-15 DIAGNOSIS — Z48812 Encounter for surgical aftercare following surgery on the circulatory system: Secondary | ICD-10-CM

## 2013-02-15 DIAGNOSIS — I6529 Occlusion and stenosis of unspecified carotid artery: Secondary | ICD-10-CM

## 2013-04-23 ENCOUNTER — Inpatient Hospital Stay (HOSPITAL_COMMUNITY): Admission: RE | Admit: 2013-04-23 | Payer: Medicare Other | Source: Ambulatory Visit

## 2013-04-23 ENCOUNTER — Ambulatory Visit: Payer: Medicare Other | Admitting: Vascular Surgery

## 2013-05-06 ENCOUNTER — Encounter: Payer: Self-pay | Admitting: Family

## 2013-05-07 ENCOUNTER — Ambulatory Visit: Payer: Medicare Other | Admitting: Family

## 2013-05-07 ENCOUNTER — Other Ambulatory Visit (HOSPITAL_COMMUNITY): Payer: Medicare Other

## 2013-05-13 DIAGNOSIS — H18459 Nodular corneal degeneration, unspecified eye: Secondary | ICD-10-CM | POA: Insufficient documentation

## 2013-06-11 ENCOUNTER — Encounter: Payer: Self-pay | Admitting: Interventional Cardiology

## 2013-06-11 ENCOUNTER — Ambulatory Visit (INDEPENDENT_AMBULATORY_CARE_PROVIDER_SITE_OTHER): Payer: Medicare Other | Admitting: Interventional Cardiology

## 2013-06-11 VITALS — BP 128/64 | HR 64 | Ht 63.0 in | Wt 127.4 lb

## 2013-06-11 DIAGNOSIS — I471 Supraventricular tachycardia: Secondary | ICD-10-CM

## 2013-06-11 DIAGNOSIS — F17200 Nicotine dependence, unspecified, uncomplicated: Secondary | ICD-10-CM

## 2013-06-11 DIAGNOSIS — I498 Other specified cardiac arrhythmias: Secondary | ICD-10-CM

## 2013-06-11 DIAGNOSIS — I6529 Occlusion and stenosis of unspecified carotid artery: Secondary | ICD-10-CM

## 2013-06-11 DIAGNOSIS — I10 Essential (primary) hypertension: Secondary | ICD-10-CM

## 2013-06-11 MED ORDER — NITROGLYCERIN 0.4 MG SL SUBL: 0.4000 mg | SUBLINGUAL_TABLET | SUBLINGUAL | Status: DC | PRN

## 2013-06-11 MED ORDER — METOPROLOL TARTRATE 25 MG PO TABS: 12.5000 mg | ORAL_TABLET | Freq: Two times a day (BID) | ORAL | Status: DC

## 2013-06-11 NOTE — Patient Instructions (Signed)
Your physician has recommended you make the following change in your medication:   1. Start Metoprolol tartrate 25 mg 1/2 tablet twice a day. You can take an extra 25 mg as needed for palpitations.   2. Start Nitroglycerin as needed for chest pain.   Your physician wants you to follow-up in: 6 months with Dr. Eldridge Dace. You will receive a reminder letter in the mail two months in advance. If you don't receive a letter, please call our office to schedule the follow-up appointment.

## 2013-06-11 NOTE — Progress Notes (Signed)
Patient ID: Vicki Hernandez, female   DOB: 07/02/1928, 80 y.o.   MRN: [id number]    [address] [place], [place]  [id number] Phone: [phone] Fax:  [phone]) [phone]  Date:  06/11/2013   ID:  Vicki Hernandez, DOB 05/03/1928, MRN [id number]  PCP:   Duane Lopeoss, Alan, MD      History of Present Illness: Vicki Hernandez is a 80 y.o. female who has had CEA, after having a knot and bruit noted in 2013. She had a rapid pulse in 2014 while at the vascular surgeon. Sx. resolved prior to ECG. She wore a monitor in 2014. She did not have any palpitations at that time while wearing the monitor.  No arrhythmia noted at that time. She has been taken to the hospital years ago for this. She has used a beta blocker as needed. She thinks she may have had 3 episodes per year. Episodes can last up to an hour. Palpitations:  feels very tired after episode of palpitations.  No SHOB.  She walks occasionally for exercise.  One episode of chest pain last night.  Exercise limited by knee pain.  She had an episode of SVT yesterday with presyncope.  She took a metoprolol and sx resolved after 15 minutes.  She still does not want to take any medicine regularly.  Two episodes in the last 6 months.  Yesterday was the orst one because of the dizziness, it was not the longest one.      Wt Readings from Last 3 Encounters:  06/11/13 127 lb 6.4 oz (57.788 kg)  12/20/12 132 lb (59.875 kg)  11/29/12 131 lb (59.421 kg)     Past Medical History  Diagnosis Date  . Hypertension   . GERD (gastroesophageal reflux disease)   . Hyperlipidemia   . Macular degeneration   . Carotid artery bruit   . Complication of anesthesia     TAKES AWHILE TO WAKE UP   . Heart murmur   . Dysrhythmia     TACHYCARDIA   . Angina     3 WEEKS  . Accessory kidney   . H/O hiatal hernia   . Arthritis     OSTEO     Current Outpatient Prescriptions  Medication Sig Dispense Refill  . aspirin 81 MG tablet Take 81 mg by mouth  every morning.       Marland Kitchen. esomeprazole (NEXIUM) 20 MG capsule Take 20 mg by mouth daily at 12 noon.      Marland Kitchen. losartan-hydrochlorothiazide (HYZAAR) 50-12.5 MG per tablet Take 1 tablet by mouth every morning.       . Multiple Vitamins-Minerals (EYE-VITES PO) Take 1 tablet by mouth every morning.       Bertram Gala. Polyethyl Glycol-Propyl Glycol (SYSTANE OP) Place 1-2 drops into both eyes daily as needed. For dry eyes      . pregabalin (LYRICA) 50 MG capsule Take 50 mg by mouth at bedtime.       . senna (SENOKOT) 8.6 MG tablet Take 1 tablet by mouth every morning.      . simvastatin (ZOCOR) 40 MG tablet Take 40 mg by mouth every evening.      . traMADol (ULTRAM) 50 MG tablet Take 50 mg by mouth every 6 (six) hours as needed. For pain      . VITAMIN D, ERGOCALCIFEROL, PO Take 1 capsule by mouth every morning.        No current facility-administered medications for this visit.  Allergies:    Allergies  Allergen Reactions  . Codeine Nausea Only  . Lansoprazole   . Sulfa Antibiotics Other (See Comments)    Unknown reaction    Social History:  The patient  reports that she has been smoking.  She does not have any smokeless tobacco history on file. She reports that she drinks alcohol. She reports that she does not use illicit drugs.   Family History:  The patient's family history includes Heart attack in her father; Heart disease in her father; Hyperlipidemia in her father; Hypertension in her father.   ROS:  Please see the history of present illness.  No nausea, vomiting.  No fevers, chills.  No focal weakness.  No dysuria. Palpitations- no trigger.    All other systems reviewed and negative.   PHYSICAL EXAM: VS:  BP 128/64  Pulse 64  Ht 5\' 3"  (1.6 m)  Wt 127 lb 6.4 oz (57.788 kg)  BMI 22.57 kg/m2 Well nourished, well developed, in no acute distress HEENT: normal Neck: no JVD, no carotid bruits Cardiac:  normal S1, S2; RRR; 2/6 systolic Lungs:  clear to auscultation bilaterally, no wheezing,  rhonchi or rales Abd: soft, nontender, no hepatomegaly Ext: no edema Skin: warm and dry Neuro:   no focal abnormalities noted  EKG:   NSR, NSST  ASSESSMENT AND PLAN:  SVT/PAT  Add metoprolol 12.5 mg BID to try to suppress SVT.  She wants the lowst possible dose.  Will titrate as needed.   Started Metoprolol Tartrate Tablet, 25 MG, 1 tablet, Orally, Twice a day as needed for palpitations, 30 day(s), 60, Refills 11 IMAGING: [facility] Echocardiogram in 05/2012) : hypertrophic cardiomyopathy     Discussed monitor and possible need for ablation if symptoms get worse. She would like to try to avoid any possible invasive testing.  -30 day event monitor in the past. Would have to consider implantable monitor if her symptoms get worse.  Chest pressure: Negative stress test in 2014.  One episode after the palpitations. Will give her a prescription for supplemental nitroglycerin. If symptoms get worse, would have to consider cardiac catheterization.  She will let us know.  Tobacco abuse: She has not had any luck quitting. She is not really trying and she feels this is one of the pleasure activities that she has left.  HTN: Controlled  Neck pain:  No obvious loss of range of motion. Carotid disease being followed by vascular surgery. She will be seeing her primary care Dr.  Leonides Schanz, Fredric Mare, MD, [facility] 06/11/2013 8:56 AM

## 2013-06-25 ENCOUNTER — Encounter: Payer: Self-pay | Admitting: Family

## 2013-06-26 ENCOUNTER — Ambulatory Visit (HOSPITAL_COMMUNITY)
Admission: RE | Admit: 2013-06-26 | Discharge: 2013-06-26 | Disposition: A | Discharge status: Home / Self Care | Payer: Medicare Other | Source: Ambulatory Visit | Attending: Family | Admitting: Family

## 2013-06-26 ENCOUNTER — Ambulatory Visit (INDEPENDENT_AMBULATORY_CARE_PROVIDER_SITE_OTHER): Payer: Medicare Other | Admitting: Family

## 2013-06-26 ENCOUNTER — Encounter: Payer: Self-pay | Admitting: Family

## 2013-06-26 VITALS — BP 107/57 | HR 53 | Resp 14 | Ht 62.0 in | Wt 127.0 lb

## 2013-06-26 DIAGNOSIS — I6529 Occlusion and stenosis of unspecified carotid artery: Secondary | ICD-10-CM

## 2013-06-26 DIAGNOSIS — Z48812 Encounter for surgical aftercare following surgery on the circulatory system: Secondary | ICD-10-CM | POA: Insufficient documentation

## 2013-06-26 NOTE — Patient Instructions (Addendum)
Stroke Prevention Some medical conditions and behaviors are associated with an increased chance of having a stroke. You may prevent a stroke by making healthy choices and managing medical conditions. HOW CAN I REDUCE MY RISK OF HAVING A STROKE?   Stay physically active. Get at least 30 minutes of activity on most or all days.  Do not smoke. It may also be helpful to avoid exposure to secondhand smoke.  Limit alcohol use. Moderate alcohol use is considered to be:  No more than 2 drinks per day for men.  No more than 1 drink per day for nonpregnant women.  Eat healthy foods. This involves  Eating 5 or more servings of fruits and vegetables a day.  Following a diet that addresses high blood pressure (hypertension), high cholesterol, diabetes, or obesity.  Manage your cholesterol levels.  A diet low in saturated fat, trans fat, and cholesterol and high in fiber may control cholesterol levels.  Take any prescribed medicines to control cholesterol as directed by your health care provider.  Manage your diabetes.  A controlled-carbohydrate, controlled-sugar diet is recommended to manage diabetes.  Take any prescribed medicines to control diabetes as directed by your health care provider.  Control your hypertension.  A low-salt (sodium), low-saturated fat, low-trans fat, and low-cholesterol diet is recommended to manage hypertension.  Take any prescribed medicines to control hypertension as directed by your health care provider.  Maintain a healthy weight.  A reduced-calorie, low-sodium, low-saturated fat, low-trans fat, low-cholesterol diet is recommended to manage weight.  Stop drug abuse.  Avoid taking birth control pills.  Talk to your health care provider about the risks of taking birth control pills if you are over 35 years old, smoke, get migraines, or have ever had a blood clot.  Get evaluated for sleep disorders (sleep apnea).  Talk to your health care provider about  getting a sleep evaluation if you snore a lot or have excessive sleepiness.  Take medicines as directed by your health care provider.  For some people, aspirin or blood thinners (anticoagulants) are helpful in reducing the risk of forming abnormal blood clots that can lead to stroke. If you have the irregular heart rhythm of atrial fibrillation, you should be on a blood thinner unless there is a good reason you cannot take them.  Understand all your medicine instructions.  Make sure that other other conditions (such as anemia or atherosclerosis) are addressed. SEEK IMMEDIATE MEDICAL CARE IF:   You have sudden weakness or numbness of the face, arm, or leg, especially on one side of the body.  Your face or eyelid droops to one side.  You have sudden confusion.  You have trouble speaking (aphasia) or understanding.  You have sudden trouble seeing in one or both eyes.  You have sudden trouble walking.  You have dizziness.  You have a loss of balance or coordination.  You have a sudden, severe headache with no known cause.  You have new chest pain or an irregular heartbeat. Any of these symptoms may represent a serious problem that is an emergency. Do not wait to see if the symptoms will go away. Get medical help at once. Call your local emergency services  (911 in [place] Do not drive yourself to the hospital. Document Released: 02/11/2004 Document Revised: 10/24/2012 Document Reviewed: 07/06/2012 Exit[employer] Patient Information 2014 [employer].   Smoking Cessation Quitting smoking is important to your health and has many advantages. However, it is not always easy to quit since nicotine is a   very addictive drug. Often times, people try 3 times or more before being able to quit. This document explains the best ways for you to prepare to quit smoking. Quitting takes hard work and a lot of effort, but you can do it. ADVANTAGES OF QUITTING SMOKING  You will live longer, feel better,  and live better.  Your body will feel the impact of quitting smoking almost immediately.  Within 20 minutes, blood pressure decreases. Your pulse returns to its normal level.  After 8 hours, carbon monoxide levels in the blood return to normal. Your oxygen level increases.  After 24 hours, the chance of having a heart attack starts to decrease. Your breath, hair, and body stop smelling like smoke.  After 48 hours, damaged nerve endings begin to recover. Your sense of taste and smell improve.  After 72 hours, the body is virtually free of nicotine. Your bronchial tubes relax and breathing becomes easier.  After 2 to 12 weeks, lungs can hold more air. Exercise becomes easier and circulation improves.  The risk of having a heart attack, stroke, cancer, or lung disease is greatly reduced.  After 1 year, the risk of coronary heart disease is cut in half.  After 5 years, the risk of stroke falls to the same as a nonsmoker.  After 10 years, the risk of lung cancer is cut in half and the risk of other cancers decreases significantly.  After 15 years, the risk of coronary heart disease drops, usually to the level of a nonsmoker.  If you are pregnant, quitting smoking will improve your chances of having a healthy baby.  The people you live with, especially any children, will be healthier.  You will have extra money to spend on things other than cigarettes. QUESTIONS TO THINK ABOUT BEFORE ATTEMPTING TO QUIT You may want to talk about your answers with your caregiver.  Why do you want to quit?  If you tried to quit in the past, what helped and what did not?  What will be the most difficult situations for you after you quit? How will you plan to handle them?  Who can help you through the tough times? Your family? Friends? A caregiver?  What pleasures do you get from smoking? What ways can you still get pleasure if you quit? Here are some questions to ask your caregiver:  How can you  help me to be successful at quitting?  What medicine do you think would be best for me and how should I take it?  What should I do if I need more help?  What is smoking withdrawal like? How can I get information on withdrawal? GET READY  Set a quit date.  Change your environment by getting rid of all cigarettes, ashtrays, matches, and lighters in your home, car, or work. Do not let people smoke in your home.  Review your past attempts to quit. Think about what worked and what did not. GET SUPPORT AND ENCOURAGEMENT You have a better chance of being successful if you have help. You can get support in many ways.  Tell your family, friends, and co-workers that you are going to quit and need their support. Ask them not to smoke around you.  Get individual, group, or telephone counseling and support. Programs are available at local hospitals and health centers. Call your local health department for information about programs in your area.  Spiritual beliefs and practices may help some smokers quit.  Download a "quit meter" on your computer   to keep track of quit statistics, such as how long you have gone without smoking, cigarettes not smoked, and money saved.  Get a self-help book about quitting smoking and staying off of tobacco. LEARN NEW SKILLS AND BEHAVIORS  Distract yourself from urges to smoke. Talk to someone, go for a walk, or occupy your time with a task.  Change your normal routine. Take a different route to work. Drink tea instead of coffee. Eat breakfast in a different place.  Reduce your stress. Take a hot bath, exercise, or read a book.  Plan something enjoyable to do every day. Reward yourself for not smoking.  Explore interactive web-based programs that specialize in helping you quit. GET MEDICINE AND USE IT CORRECTLY Medicines can help you stop smoking and decrease the urge to smoke. Combining medicine with the above behavioral methods and support can greatly increase  your chances of successfully quitting smoking.  Nicotine replacement therapy helps deliver nicotine to your body without the negative effects and risks of smoking. Nicotine replacement therapy includes nicotine gum, lozenges, inhalers, nasal sprays, and skin patches. Some may be available over-the-counter and others require a prescription.  Antidepressant medicine helps people abstain from smoking, but how this works is unknown. This medicine is available by prescription.  Nicotinic receptor partial agonist medicine simulates the effect of nicotine in your brain. This medicine is available by prescription. Ask your caregiver for advice about which medicines to use and how to use them based on your health history. Your caregiver will tell you what side effects to look out for if you choose to be on a medicine or therapy. Carefully read the information on the package. Do not use any other product containing nicotine while using a nicotine replacement product.  RELAPSE OR DIFFICULT SITUATIONS Most relapses occur within the first 3 months after quitting. Do not be discouraged if you start smoking again. Remember, most people try several times before finally quitting. You may have symptoms of withdrawal because your body is used to nicotine. You may crave cigarettes, be irritable, feel very hungry, cough often, get headaches, or have difficulty concentrating. The withdrawal symptoms are only temporary. They are strongest when you first quit, but they will go away within 10 14 days. To reduce the chances of relapse, try to:  Avoid drinking alcohol. Drinking lowers your chances of successfully quitting.  Reduce the amount of caffeine you consume. Once you quit smoking, the amount of caffeine in your body increases and can give you symptoms, such as a rapid heartbeat, sweating, and anxiety.  Avoid smokers because they can make you want to smoke.  Do not let weight gain distract you. Many smokers will gain  weight when they quit, usually less than 10 pounds. Eat a healthy diet and stay active. You can always lose the weight gained after you quit.  Find ways to improve your mood other than smoking. FOR MORE INFORMATION  www.smokefree.gov  Document Released: 12/28/2000 Document Revised: 07/05/2011 Document Reviewed: 04/14/2011 ExitCare Patient Information 2014 [employer].  

## 2013-06-26 NOTE — Progress Notes (Signed)
Established Carotid Patient   History of Present Illness  Vicki Hernandez is a 80 y.o. female patient of Dr. Hart Rochester who is s/p left carotid endarterectomy performed in March of 2013.  She returns today for follow up. Pt denies any history of stroke or TIA, denies claudication symptoms with walking.  The patient denies amaurosis fugax or monocular blindness.  The patient  denies facial drooping.  Pt. denies hemiplegia.  The patient denies receptive or expressive aphasia.  Pt. denies extremity weakness. She denies any pain, tingling, numbness or weakness in arms or hands.  Patient  denies New Medical or Surgical History.  Pt Diabetic: No Pt smoker: smoker  (1/2 ppd, started at age 80 yrs)  Pt meds include: Statin : Yes ASA: Yes Other anticoagulants/antiplatelets: no   Past Medical History  Diagnosis Date  . Hypertension   . GERD (gastroesophageal reflux disease)   . Hyperlipidemia   . Macular degeneration   . Carotid artery bruit   . Complication of anesthesia     TAKES AWHILE TO WAKE UP   . Heart murmur   . Dysrhythmia     TACHYCARDIA   . Angina     3 WEEKS  . Accessory kidney   . H/O hiatal hernia   . Arthritis     OSTEO   . Atrial fibrillation 2014    Social History History  Substance Use Topics  . Smoking status: Light Tobacco Smoker -- 0.50 packs/day  . Smokeless tobacco: Never Used  . Alcohol Use: Yes     Comment: social    Family History Family History  Problem Relation Age of Onset  . Hyperlipidemia Father   . Hypertension Father   . Heart disease Father     Heart Disease before age 52  . Heart attack Father   . Stroke Mother     Surgical History Past Surgical History  Procedure Laterality Date  . Spine surgery  2002, 2010    Lumbar disk/ spine surgeries X 2   By Dr. Shon Baton  . Hemorrhoid surgery    . Knee surgery    . Cardiovascular stress test      5 YRS AGO     DR H SMITH  (NOW SEES DR Eldridge Dace)  . Joint replacement  2012    Left  Knee  . Tonsillectomy    . Appendectomy    . Cataract extraction w/ intraocular lens  implant, bilateral    . Endarterectomy  03/25/2011    Procedure: ENDARTERECTOMY CAROTID;  Surgeon: Pryor Ochoa, MD;  Location: [facility] OR;  Service: Vascular;  Laterality: Left;  Left Carotid Endarterectomy with dacron patch angioplasty, with resection of redundant common carotid with primary reanastamosis  . Carotid endarterectomy Left 03-25-11    cea  . Abdominal hysterectomy  1974    TAH w/ BSO  . Eye surgery      Allergies  Allergen Reactions  . Codeine Nausea Only  . Sulfa Antibiotics Other (See Comments)    Unknown reaction    Current Outpatient Prescriptions  Medication Sig Dispense Refill  . aspirin 81 MG tablet Take 81 mg by mouth every morning.       Marland Kitchen esomeprazole (NEXIUM) 20 MG capsule Take 20 mg by mouth daily at 12 noon.      Marland Kitchen losartan-hydrochlorothiazide (HYZAAR) 50-12.5 MG per tablet Take 1 tablet by mouth every morning.       . metoprolol tartrate (LOPRESSOR) 25 MG tablet Take 0.5 tablets (12.5 mg total) by  mouth 2 (two) times daily. You can take an extra 25 mg as needed for palpitations.  30 tablet  6  . Multiple Vitamins-Minerals (EYE-VITES PO) Take 1 tablet by mouth every morning.       . nitroGLYCERIN (NITROSTAT) 0.4 MG SL tablet Place 1 tablet (0.4 mg total) under the tongue every 5 (five) minutes as needed for chest pain.  25 tablet  5  . Polyethyl Glycol-Propyl Glycol (SYSTANE OP) Place 1-2 drops into both eyes daily as needed. For dry eyes      . pregabalin (LYRICA) 50 MG capsule Take 50 mg by mouth at bedtime.       . senna (SENOKOT) 8.6 MG tablet Take 1 tablet by mouth every morning.      . simvastatin (ZOCOR) 40 MG tablet Take 40 mg by mouth every evening.      . traMADol (ULTRAM) 50 MG tablet Take 50 mg by mouth every 6 (six) hours as needed. For pain      . VIGAMOX 0.5 % ophthalmic solution       . VITAMIN D, ERGOCALCIFEROL, PO Take 1 capsule by mouth every morning.        . prednisoLONE acetate (PRED FORTE) 1 % ophthalmic suspension        No current facility-administered medications for this visit.    Review of Systems : See HPI for pertinent positives and negatives.  Physical Examination  Filed Vitals:   06/26/13 1013 06/26/13 1016  BP: 104/56 107/57  Pulse: 54 53  Resp:  14  Height:  5\' 2"  (1.575 m)  Weight:  127 lb (57.607 kg)  SpO2:  98%  Body mass index is 23.22 kg/(m^2).  General: WDWN female in NAD GAIT: normal Eyes: PERRLA Pulmonary:  Non-labored, diminished air movement in all lung fields, Negative  Rales, Negative rhonchi, & Negative wheezing.  Cardiac: regular Rhythm ,  Negative detected murmur.  VASCULAR EXAM Carotid Bruits Left Right   Negative Negative   Radial pulses are 2+ palpable and equal.                                                                                                                            LE Pulses LEFT RIGHT       POPLITEAL  not palpable   not palpable       POSTERIOR TIBIAL  not palpable   not palpable        DORSALIS PEDIS      ANTERIOR TIBIAL not palpable   palpable     Gastrointestinal: soft, nontender, BS WNL, no r/g,  negative masses.  Musculoskeletal: Negative muscle atrophy/wasting. M/S 5/5 in UE's, 4/5 in LE's, Extremities without ischemic changes.  Neurologic: A&O X 3; Appropriate Affect ; SENSATION ;normal;  Speech is normal CN 2-12 intact, Pain and light touch intact in extremities, Motor exam as listed above.   Non-Invasive Vascular Imaging CAROTID DUPLEX 06/26/2013   CEREBROVASCULAR DUPLEX EVALUATION    INDICATION:  Carotid endarterectomy     PREVIOUS INTERVENTION(S): Left carotid endarterectomy on 03/25/11    DUPLEX EXAM:     RIGHT  LEFT  Peak Systolic Velocities (cm/s) End Diastolic Velocities (cm/s) Plaque LOCATION Peak Systolic Velocities (cm/s) End Diastolic Velocities (cm/s) Plaque  61 10  CCA PROXIMAL 78 15   66 13  CCA MID 76 18 HT  55 11 HT CCA DISTAL 74  12   84 8 HT ECA 77 6   78 18 CP ICA PROXIMAL 50 11   72 16  ICA MID 60 17   59 13  ICA DISTAL 63 15     1.42 ICA / CCA Ratio (PSV) Not Calculated  Antegrade Vertebral Flow Antegrade   Brachial Systolic Pressure (mmHg)   Multiphasic (subclavian artery) Brachial Artery Waveforms Multiphasic (subclavian artery)    Plaque Morphology:  HM = Homogeneous, HT = Heterogeneous, CP = Calcific Plaque, SP = Smooth Plaque, IP = Irregular Plaque     ADDITIONAL FINDINGS: No significant stenosis of the bilateral external or common carotid arteries.    IMPRESSION: 1. Patent left carotid endarterectomy site with no left internal carotid artery stenosis. 2. Doppler velocities suggest a less than 40% stenosis of the right proximal internal carotid artery.    Compared to the previous exam:  No significant change noted when compared to the previous exam on 04/17/12.     Assessment: Vicki Hernandez is a 80 y.o. female who is s/p left carotid endarterectomy performed in March of 2013. Pt denies any history of stroke or TIA. Patent left carotid endarterectomy site with no left internal carotid artery stenosis. Doppler velocities suggest a less than 40% stenosis of the right proximal internal carotid artery. No significant change noted when compared to the previous exam on 04/17/12.   Plan: Follow-up in 1 year with Carotid Duplex scan.  Patient was counseled re smoking cessation and given a pamphlet re a smoking cessation class to attend at [facility].  I discussed in depth with the patient the nature of atherosclerosis, and emphasized the importance of maximal medical management including strict control of blood pressure, blood glucose, and lipid levels, obtaining regular exercise, and cessation of smoking.  The patient is aware that without maximal medical management the underlying atherosclerotic disease process will progress, limiting the benefit of any interventions. The patient was given  information about stroke prevention and what symptoms should prompt the patient to seek immediate medical care. Thank you for allowing [place] to participate in this patient's care.  Charisse March, RN, MSN, FNP-C [facility] Office: [phone]  Clinic Physician: Edilia Bo  06/26/2013 10:38 AM

## 2013-06-26 NOTE — Addendum Note (Signed)
Addended by: Sharee Pimple on: 06/26/2013 11:20 AM   Modules accepted: Orders

## 2013-12-10 ENCOUNTER — Ambulatory Visit (INDEPENDENT_AMBULATORY_CARE_PROVIDER_SITE_OTHER): Payer: Medicare Other | Admitting: Interventional Cardiology

## 2013-12-10 ENCOUNTER — Encounter: Payer: Self-pay | Admitting: Interventional Cardiology

## 2013-12-10 VITALS — BP 110/70 | HR 65 | Ht 62.0 in | Wt 124.1 lb

## 2013-12-10 DIAGNOSIS — I739 Peripheral vascular disease, unspecified: Secondary | ICD-10-CM

## 2013-12-10 DIAGNOSIS — Z720 Tobacco use: Secondary | ICD-10-CM

## 2013-12-10 DIAGNOSIS — I10 Essential (primary) hypertension: Secondary | ICD-10-CM

## 2013-12-10 DIAGNOSIS — F17200 Nicotine dependence, unspecified, uncomplicated: Secondary | ICD-10-CM

## 2013-12-10 DIAGNOSIS — I471 Supraventricular tachycardia: Secondary | ICD-10-CM

## 2013-12-10 NOTE — Patient Instructions (Signed)
Your physician recommends that you continue on your current medications as directed. Please refer to the Current Medication list given to you today.  Your physician wants you to follow-up in: 1 year with Dr. Varanasi. You will receive a reminder letter in the mail two months in advance. If you don't receive a letter, please call our office to schedule the follow-up appointment.  

## 2013-12-10 NOTE — Progress Notes (Signed)
Patient ID: Vicki Hernandez, female   DOB: 08/26/1928, 80 y.o.   MRN: [id number] Patient ID: Vicki Nightingalestelle B Parlier, female   DOB: 05/22/1928, 80 y.o.   MRN: [id number]    [address], [address] [place], [place]  [address] Phone: [phone] Fax:  [phone]  Date:  12/10/2013   ID:  Vicki Nightingalestelle B Nicks, DOB 07/02/1928, MRN [id number]  PCP:   Duane Lopeoss, Alan, MD      History of Present Illness: Vicki Nightingalestelle B Allbee is a 80 y.o. female who has had CEA, after having a knot and bruit noted in 2013. She had a rapid pulse in 2014 while at the vascular surgeon. Sx. resolved prior to ECG. She wore a monitor in 2014. She did not have any palpitations at that time while wearing the monitor.  No arrhythmia noted at that time. She has been taken to the hospital years ago for this. She has used a beta blocker as needed. She thinks she may have had 3 episodes per year. Episodes can last up to an hour. Palpitations:  feels very tired after episode of palpitations.  No SHOB.  She walks occasionally for exercise.  One episode of chest pain last night.  Exercise limited by knee pain.  She had an episode of SVT yesterday with presyncope.  She took a metoprolol and sx resolved after 15 minutes.  She still does not want to take any medicine regularly.  Two episodes in the last 6 months.  Yesterday was the orst one because of the dizziness, it was not the longest one.      Wt Readings from Last 3 Encounters:  12/10/13 124 lb 1.9 oz (56.3 kg)  06/26/13 127 lb (57.607 kg)  06/11/13 127 lb 6.4 oz (57.788 kg)     Past Medical History  Diagnosis Date  . Hypertension   . GERD (gastroesophageal reflux disease)   . Hyperlipidemia   . Macular degeneration   . Carotid artery bruit   . Complication of anesthesia     TAKES AWHILE TO WAKE UP   . Heart murmur   . Dysrhythmia     TACHYCARDIA   . Angina     3 WEEKS  . Accessory kidney   . H/O hiatal hernia   . Arthritis     OSTEO   . Atrial fibrillation 2014     Current Outpatient Prescriptions  Medication Sig Dispense Refill  . aspirin 81 MG tablet Take 81 mg by mouth every morning.     Marland Kitchen. esomeprazole (NEXIUM) 20 MG capsule Take 20 mg by mouth daily at 12 noon.    Marland Kitchen. losartan-hydrochlorothiazide (HYZAAR) 50-12.5 MG per tablet Take 1 tablet by mouth every morning.     . metoprolol tartrate (LOPRESSOR) 25 MG tablet Take 0.5 tablets (12.5 mg total) by mouth 2 (two) times daily. You can take an extra 25 mg as needed for palpitations. 30 tablet 6  . Multiple Vitamins-Minerals (EYE-VITES PO) Take 1 tablet by mouth every morning.     Bertram Gala. Polyethyl Glycol-Propyl Glycol (SYSTANE OP) Place 1-2 drops into both eyes daily as needed. For dry eyes    . pregabalin (LYRICA) 50 MG capsule Take 50 mg by mouth at bedtime.     . senna (SENOKOT) 8.6 MG tablet Take 1 tablet by mouth every morning.    . simvastatin (ZOCOR) 40 MG tablet Take 40 mg by mouth every evening.    . traMADol (ULTRAM) 50 MG tablet Take 50 mg by mouth every  6 (six) hours as needed. For pain    . VITAMIN D, ERGOCALCIFEROL, PO Take 1 capsule by mouth every morning.     . nitroGLYCERIN (NITROSTAT) 0.4 MG SL tablet Place 1 tablet (0.4 mg total) under the tongue every 5 (five) minutes as needed for chest pain. (Patient not taking: Reported on 12/10/2013) 25 tablet 5  . VIGAMOX 0.5 % ophthalmic solution      No current facility-administered medications for this visit.    Allergies:    Allergies  Allergen Reactions  . Codeine Nausea Only  . Sulfa Antibiotics Other (See Comments)    Unknown reaction    Social History:  The patient  reports that she has been smoking.  She has never used smokeless tobacco. She reports that she drinks alcohol. She reports that she does not use illicit drugs.   Family History:  The patient's family history includes Heart attack in her father; Heart disease in her father; Hyperlipidemia in her father; Hypertension in her father; Stroke in her mother.   ROS:  Please  see the history of present illness.  No nausea, vomiting.  No fevers, chills.  No focal weakness.  No dysuria. Palpitations- no trigger.    All other systems reviewed and negative.   PHYSICAL EXAM: VS:  BP 110/70 mmHg  Pulse 65  Ht 5\' 2"  (1.575 m)  Wt 124 lb 1.9 oz (56.3 kg)  BMI 22.70 kg/m2 Well nourished, well developed, in no acute distress HEENT: normal Neck: no JVD, no carotid bruits Cardiac:  normal S1, S2; RRR; 2/6 systolic Lungs:  clear to auscultation bilaterally, no wheezing, rhonchi or rales Abd: soft, nontender, no hepatomegaly Ext: no edema Skin: warm and dry Neuro:   no focal abnormalities noted Psych: normal affect  EKG:   NSR, NSST  ASSESSMENT AND PLAN:  SVT/PAT  Added metoprolol 12.5 mg BID to try to suppress SVT.  Tolerating the lowst possible dose.  Will titrate as needed, but currently no symptoms.   Continue Metoprolol Tartrate Tablet, 25 MG, 1 tablet, Orally, Twice a day as needed for palpitations, 30 day(s), 60, Refills 11 EC Echocardiogram in 05/2012) : hypertrophic cardiomyopathy  She forgets her PM dose. She has been taking a full pill in the morning some days.  She will take the medicine with teeth brushing to take it twice a day.   Discussed monitor and possible need for ablation if symptoms get worse. She would like to try to avoid any possible invasive testing.  -30 day event monitor in the past. Would have to consider implantable monitor if her symptoms get worse.  Chest pressure: Negative stress test in 2014.  One episode after the palpitations in early 2015. Gave her a prescription for supplemental nitroglycerin, but no further sx.   Tobacco abuse: She has not had any luck quitting. She is not really trying and she feels this is one of the pleasure activities that she has left.  HTN: Controlled   Carotid disease being followed by vascular surgery. q 6 month u/s with Dr. Hart RochesterLawson.  Signed, Fredric MareJay S. Shakari Qazi, MD, [facility] 12/10/2013 9:42 AM

## 2014-04-03 ENCOUNTER — Other Ambulatory Visit: Payer: Self-pay | Admitting: Interventional Cardiology

## 2014-05-31 ENCOUNTER — Emergency Department (HOSPITAL_COMMUNITY): Payer: Medicare Other

## 2014-05-31 ENCOUNTER — Emergency Department (HOSPITAL_COMMUNITY)
Admission: EM | Admit: 2014-05-31 | Discharge: 2014-05-31 | Disposition: A | Discharge status: Home / Self Care | Payer: Medicare Other | Attending: Emergency Medicine | Admitting: Emergency Medicine

## 2014-05-31 ENCOUNTER — Encounter (HOSPITAL_COMMUNITY): Payer: Self-pay | Admitting: Emergency Medicine

## 2014-05-31 DIAGNOSIS — K219 Gastro-esophageal reflux disease without esophagitis: Secondary | ICD-10-CM | POA: Diagnosis not present

## 2014-05-31 DIAGNOSIS — Z720 Tobacco use: Secondary | ICD-10-CM | POA: Insufficient documentation

## 2014-05-31 DIAGNOSIS — Y9389 Activity, other specified: Secondary | ICD-10-CM | POA: Diagnosis not present

## 2014-05-31 DIAGNOSIS — I209 Angina pectoris, unspecified: Secondary | ICD-10-CM | POA: Diagnosis not present

## 2014-05-31 DIAGNOSIS — R011 Cardiac murmur, unspecified: Secondary | ICD-10-CM | POA: Insufficient documentation

## 2014-05-31 DIAGNOSIS — S93402A Sprain of unspecified ligament of left ankle, initial encounter: Secondary | ICD-10-CM | POA: Insufficient documentation

## 2014-05-31 DIAGNOSIS — Z7982 Long term (current) use of aspirin: Secondary | ICD-10-CM | POA: Diagnosis not present

## 2014-05-31 DIAGNOSIS — M1990 Unspecified osteoarthritis, unspecified site: Secondary | ICD-10-CM | POA: Diagnosis not present

## 2014-05-31 DIAGNOSIS — Y9289 Other specified places as the place of occurrence of the external cause: Secondary | ICD-10-CM | POA: Insufficient documentation

## 2014-05-31 DIAGNOSIS — I4891 Unspecified atrial fibrillation: Secondary | ICD-10-CM | POA: Diagnosis not present

## 2014-05-31 DIAGNOSIS — Q630 Accessory kidney: Secondary | ICD-10-CM | POA: Insufficient documentation

## 2014-05-31 DIAGNOSIS — Z79899 Other long term (current) drug therapy: Secondary | ICD-10-CM | POA: Diagnosis not present

## 2014-05-31 DIAGNOSIS — S99912A Unspecified injury of left ankle, initial encounter: Secondary | ICD-10-CM | POA: Diagnosis present

## 2014-05-31 DIAGNOSIS — I10 Essential (primary) hypertension: Secondary | ICD-10-CM | POA: Insufficient documentation

## 2014-05-31 DIAGNOSIS — W1839XA Other fall on same level, initial encounter: Secondary | ICD-10-CM | POA: Insufficient documentation

## 2014-05-31 DIAGNOSIS — Y998 Other external cause status: Secondary | ICD-10-CM | POA: Diagnosis not present

## 2014-05-31 DIAGNOSIS — E785 Hyperlipidemia, unspecified: Secondary | ICD-10-CM | POA: Diagnosis not present

## 2014-05-31 MED ORDER — IBUPROFEN 600 MG PO TABS: 600.0000 mg | ORAL_TABLET | Freq: Four times a day (QID) | ORAL | Status: DC | PRN

## 2014-05-31 MED ORDER — HYDROCODONE-ACETAMINOPHEN 5-325 MG PO TABS
1.0000 | ORAL_TABLET | Freq: Once | ORAL | Status: AC
  Administered 2014-05-31: 1 via ORAL
  Filled 2014-05-31: qty 1

## 2014-05-31 MED ORDER — HYDROCODONE-ACETAMINOPHEN 5-325 MG PO TABS: 1.0000 | ORAL_TABLET | ORAL | Status: DC | PRN

## 2014-05-31 NOTE — Discharge Instructions (Signed)
Ankle Sprain °An ankle sprain is an injury to the strong, fibrous tissues (ligaments) that hold the bones of your ankle joint together.  °CAUSES °An ankle sprain is usually caused by a fall or by twisting your ankle. Ankle sprains most commonly occur when you step on the outer edge of your foot, and your ankle turns inward. People who participate in sports are more prone to these types of injuries.  °SYMPTOMS  °· Pain in your ankle. The pain may be present at rest or only when you are trying to stand or walk. °· Swelling. °· Bruising. Bruising may develop immediately or within 1 to 2 days after your injury. °· Difficulty standing or walking, particularly when turning corners or changing directions. °DIAGNOSIS  °Your caregiver will ask you details about your injury and perform a physical exam of your ankle to determine if you have an ankle sprain. During the physical exam, your caregiver will press on and apply pressure to specific areas of your foot and ankle. Your caregiver will try to move your ankle in certain ways. An X-ray exam may be done to be sure a bone was not broken or a ligament did not separate from one of the bones in your ankle (avulsion fracture).  °TREATMENT  °Certain types of braces can help stabilize your ankle. Your caregiver can make a recommendation for this. Your caregiver may recommend the use of medicine for pain. If your sprain is severe, your caregiver may refer you to a surgeon who helps to restore function to parts of your skeletal system (orthopedist) or a physical therapist. °HOME CARE INSTRUCTIONS  °· Apply ice to your injury for 1-2 days or as directed by your caregiver. Applying ice helps to reduce inflammation and pain. °· Put ice in a plastic bag. °· Place a towel between your skin and the bag. °· Leave the ice on for 15-20 minutes at a time, every 2 hours while you are awake. °· Only take over-the-counter or prescription medicines for pain, discomfort, or fever as directed by  your caregiver. °· Elevate your injured ankle above the level of your heart as much as possible for 2-3 days. °· If your caregiver recommends crutches, use them as instructed. Gradually put weight on the affected ankle. Continue to use crutches or a cane until you can walk without feeling pain in your ankle. °· If you have a plaster splint, wear the splint as directed by your caregiver. Do not rest it on anything harder than a pillow for the first 24 hours. Do not put weight on it. Do not get it wet. You may take it off to take a shower or bath. °· You may have been given an elastic bandage to wear around your ankle to provide support. If the elastic bandage is too tight (you have numbness or tingling in your foot or your foot becomes cold and blue), adjust the bandage to make it comfortable. °· If you have an air splint, you may blow more air into it or let air out to make it more comfortable. You may take your splint off at night and before taking a shower or bath. Wiggle your toes in the splint several times per day to decrease swelling. °SEEK MEDICAL CARE IF:  °· You have rapidly increasing bruising or swelling. °· Your toes feel extremely cold or you lose feeling in your foot. °· Your pain is not relieved with medicine. °SEEK IMMEDIATE MEDICAL CARE IF: °· Your toes are numb or blue. °·   You have severe pain that is increasing. °MAKE SURE YOU:  °· Understand these instructions. °· Will watch your condition. °· Will get help right away if you are not doing well or get worse. °Document Released: 01/03/2005 Document Revised: 09/28/2011 Document Reviewed: 01/15/2011 °ExitCare® Patient Information ©2015 [employer]. This information is not intended to replace advice given to you by your health care provider. Make sure you discuss any questions you have with your health care provider. °Cryotherapy °Cryotherapy means treatment with cold. Ice or gel packs can be used to reduce both pain and swelling. Ice is the most  helpful within the first 24 to 48 hours after an injury or flare-up from overusing a muscle or joint. Sprains, strains, spasms, burning pain, shooting pain, and aches can all be eased with ice. Ice can also be used when recovering from surgery. Ice is effective, has very few side effects, and is safe for most people to use. °PRECAUTIONS  °Ice is not a safe treatment option for people with: °· Raynaud phenomenon. This is a condition affecting small blood vessels in the extremities. Exposure to cold may cause your problems to return. °· Cold hypersensitivity. There are many forms of cold hypersensitivity, including: °¨ Cold urticaria. Red, itchy hives appear on the skin when the tissues begin to warm after being iced. °¨ Cold erythema. This is a red, itchy rash caused by exposure to cold. °¨ Cold hemoglobinuria. Red blood cells break down when the tissues begin to warm after being iced. The hemoglobin that carry oxygen are passed into the urine because they cannot combine with blood proteins fast enough. °· Numbness or altered sensitivity in the area being iced. °If you have any of the following conditions, do not use ice until you have discussed cryotherapy with your caregiver: °· Heart conditions, such as arrhythmia, angina, or chronic heart disease. °· High blood pressure. °· Healing wounds or open skin in the area being iced. °· Current infections. °· Rheumatoid arthritis. °· Poor circulation. °· Diabetes. °Ice slows the blood flow in the region it is applied. This is beneficial when trying to stop inflamed tissues from spreading irritating chemicals to surrounding tissues. However, if you expose your skin to cold temperatures for too long or without the proper protection, you can damage your skin or nerves. Watch for signs of skin damage due to cold. °HOME CARE INSTRUCTIONS °Follow these tips to use ice and cold packs safely. °· Place a dry or damp towel between the ice and skin. A damp towel will cool the skin  more quickly, so you may need to shorten the time that the ice is used. °· For a more rapid response, add gentle compression to the ice. °· Ice for no more than 10 to 20 minutes at a time. The bonier the area you are icing, the less time it will take to get the benefits of ice. °· Check your skin after 5 minutes to make sure there are no signs of a poor response to cold or skin damage. °· Rest 20 minutes or more between uses. °· Once your skin is numb, you can end your treatment. You can test numbness by very lightly touching your skin. The touch should be so light that you do not see the skin dimple from the pressure of your fingertip. When using ice, most people will feel these normal sensations in this order: cold, burning, aching, and numbness. °· Do not use ice on someone who cannot communicate their responses to pain,   such as small children or people with dementia. °HOW TO MAKE AN ICE PACK °Ice packs are the most common way to use ice therapy. Other methods include ice massage, ice baths, and cryosprays. Muscle creams that cause a cold, tingly feeling do not offer the same benefits that ice offers and should not be used as a substitute unless recommended by your caregiver. °To make an ice pack, do one of the following: °· Place crushed ice or a bag of frozen vegetables in a sealable plastic bag. Squeeze out the excess air. Place this bag inside another plastic bag. Slide the bag into a pillowcase or place a damp towel between your skin and the bag. °· Mix 3 parts water with 1 part rubbing alcohol. Freeze the mixture in a sealable plastic bag. When you remove the mixture from the freezer, it will be slushy. Squeeze out the excess air. Place this bag inside another plastic bag. Slide the bag into a pillowcase or place a damp towel between your skin and the bag. °SEEK MEDICAL CARE IF: °· You develop white spots on your skin. This may give the skin a blotchy (mottled) appearance. °· Your skin turns blue or  pale. °· Your skin becomes waxy or hard. °· Your swelling gets worse. °MAKE SURE YOU:  °· Understand these instructions. °· Will watch your condition. °· Will get help right away if you are not doing well or get worse. °Document Released: 08/30/2010 Document Revised: 05/20/2013 Document Reviewed: 08/30/2010 °ExitCare® Patient Information ©2015 [employer]. This information is not intended to replace advice given to you by your health care provider. Make sure you discuss any questions you have with your health care provider. ° °

## 2014-05-31 NOTE — ED Notes (Signed)
Pt reports falling twice on left ankle. Fell and hurt same ankle a few weeks ago. Denies dizziness before falling. Has macular degeneration and takes Lyrica -unsteady because of this. Able to wiggle toes but has limited ROM on left ankle. No other c/c.

## 2014-05-31 NOTE — ED Provider Notes (Signed)
CSN: [id number]     Arrival date & time 05/31/14  2040 History  This chart was scribed for non-physician practitioner, Elpidio AnisShari Allycia Pitz, PA-C, working with Benjiman CoreNathan Pickering, MD, by Ronney LionSuzanne Le, ED Scribe. This patient was seen in room WTR5/WTR5 and the patient's care was started at 9:51 PM.    Chief Complaint  Patient presents with  . Fall  . Ankle Injury   The history is provided by the patient. No language interpreter was used.     HPI Comments: Vicki Hernandez is a 81 y.o. female who presents to the Emergency Department complaining of left ankle pain S/P falling twice onto her left ankle. She states she caught her foot on her carpet and fell on to the ottoman. She denies any other injuries. Patient is able to wiggle her toes. She took a Tramadol, which she takes for arthritis, 2 hours ago, with only some relief. Patient has known allergies to codeine and sulfa.  Past Medical History  Diagnosis Date  . Hypertension   . GERD (gastroesophageal reflux disease)   . Hyperlipidemia   . Macular degeneration   . Carotid artery bruit   . Complication of anesthesia     TAKES AWHILE TO WAKE UP   . Heart murmur   . Dysrhythmia     TACHYCARDIA   . Angina     3 WEEKS  . Accessory kidney   . H/O hiatal hernia   . Arthritis     OSTEO   . Atrial fibrillation 2014   Past Surgical History  Procedure Laterality Date  . Spine surgery  2002, 2010    Lumbar disk/ spine surgeries X 2   By Dr. Shon BatonBrooks  . Hemorrhoid surgery    . Knee surgery    . Cardiovascular stress test      5 YRS AGO     DR H SMITH  (NOW SEES DR Eldridge DaceVARANASI)  . Joint replacement  2012    Left Knee  . Tonsillectomy    . Appendectomy    . Cataract extraction w/ intraocular lens  implant, bilateral    . Endarterectomy  03/25/2011    Procedure: ENDARTERECTOMY CAROTID;  Surgeon: Pryor OchoaJames D Lawson, MD;  [facility];  Service: Vascular;  Laterality: Left;  Left Carotid Endarterectomy with dacron patch angioplasty, with resection of  redundant common carotid with primary reanastamosis  . Carotid endarterectomy Left 03-25-11    cea  . Abdominal hysterectomy  1974    TAH w/ BSO  . Eye surgery     Family History  Problem Relation Age of Onset  . Hyperlipidemia Father   . Hypertension Father   . Heart disease Father     Heart Disease before age 79  . Heart attack Father   . Stroke Mother    History  Substance Use Topics  . Smoking status: Light Tobacco Smoker -- 0.50 packs/day  . Smokeless tobacco: Never Used  . Alcohol Use: Yes     Comment: social   OB History    No data available     Review of Systems  Cardiovascular: Negative for chest pain.  Gastrointestinal: Negative for abdominal pain.  Musculoskeletal: Positive for arthralgias. Negative for back pain and neck pain.  Neurological: Negative for headaches.    Allergies  Codeine and Sulfa antibiotics  Home Medications   Prior to Admission medications   Medication Sig Start Date End Date Taking? Authorizing Provider  aspirin 81 MG tablet Take 81 mg by mouth every morning.  Yes Historical Provider, MD  esomeprazole (NEXIUM) 20 MG capsule Take 20 mg by mouth daily at 12 noon.   Yes Historical Provider, MD  losartan-hydrochlorothiazide (HYZAAR) 50-12.5 MG per tablet Take 1 tablet by mouth every morning.    Yes Historical Provider, MD  metoprolol tartrate (LOPRESSOR) 25 MG tablet TAKE 1/2 TABLET TWICE DAILY, MAY TAKE AN EXTRA 1 TABLET AS NEEDED FOR PALPITATIONS 04/04/14  Yes Corky CraftsJayadeep S Varanasi, MD  Multiple Vitamins-Minerals (EYE-VITES PO) Take 1 tablet by mouth 2 (two) times daily.    Yes Historical Provider, MD  nitroGLYCERIN (NITROSTAT) 0.4 MG SL tablet Place 1 tablet (0.4 mg total) under the tongue every 5 (five) minutes as needed for chest pain. 06/11/13  Yes Corky CraftsJayadeep S Varanasi, MD  Polyethyl Glycol-Propyl Glycol (SYSTANE OP) Place 1-2 drops into both eyes daily as needed. For dry eyes   Yes Historical Provider, MD  pregabalin (LYRICA) 50 MG capsule  Take 50 mg by mouth at bedtime.    Yes Historical Provider, MD  senna (SENOKOT) 8.6 MG tablet Take 1 tablet by mouth every morning.   Yes Historical Provider, MD  simvastatin (ZOCOR) 40 MG tablet Take 40 mg by mouth every evening.   Yes Historical Provider, MD  traMADol (ULTRAM) 50 MG tablet Take 50 mg by mouth every 6 (six) hours as needed. For pain   Yes Historical Provider, MD  VITAMIN D, ERGOCALCIFEROL, PO Take 1 capsule by mouth every morning.    Yes Historical Provider, MD   BP 112/56 mmHg  Pulse 64  Temp(Src) 98.1 F (36.7 C) (Oral)  SpO2 94% Physical Exam  Constitutional: She is oriented to person, place, and time. She appears well-developed and well-nourished. No distress.  HENT:  Head: Normocephalic and atraumatic.  Eyes: Conjunctivae and EOM are normal.  Neck: Neck supple. No tracheal deviation present.  Cardiovascular: Normal rate.   Pulmonary/Chest: Effort normal. No respiratory distress.  Musculoskeletal: Normal range of motion. She exhibits tenderness.  Left foot minimally swollen, with lateral ankle tenderness, extending along the fifth metatarsal. No bony deformities. NVI. No tendon deficits. Calf and knee are non-tender.  Neurological: She is alert and oriented to person, place, and time.  Skin: Skin is warm and dry.  Psychiatric: She has a normal mood and affect. Her behavior is normal.  Nursing note and vitals reviewed.   ED Course  Procedures (including critical care time)  DIAGNOSTIC STUDIES: Oxygen Saturation is 94% on RA, normal by my interpretation.    COORDINATION OF CARE: 9:57 PM - Discussed treatment plan with pt at bedside which includes Vicodin, and pt agreed to plan.  Medications  HYDROcodone-acetaminophen (NORCO/VICODIN) 5-325 MG per tablet 1 tablet (not administered)    Imaging Review Dg Ankle Complete Left  05/31/2014   CLINICAL DATA:  Status post fall twice onto left ankle, with left ankle pain and limited range of motion. Initial encounter.   EXAM: LEFT ANKLE COMPLETE - 3+ VIEW  COMPARISON:  None.  FINDINGS: There is no evidence of fracture or dislocation. The ankle mortise is intact; the interosseous space is within normal limits. No talar tilt or subluxation is seen. Plantar and posterior calcaneal spurs are noted.  The joint spaces are preserved. No significant soft tissue abnormalities are seen.  IMPRESSION: No evidence of fracture or dislocation.   Electronically Signed   By: Roanna RaiderJeffery  Chang M.D.   On: 05/31/2014 21:58   Dg Foot Complete Left  05/31/2014   CLINICAL DATA:  Patient fell twice on left ankle. Also fell  on the ankle a few weeks ago. Ankle injury.  EXAM: LEFT FOOT - COMPLETE 3+ VIEW  COMPARISON:  None.  FINDINGS: Degenerative changes in the interphalangeal joints, first metatarsal-phalangeal joint, and intertarsal joints. Plantar and Achilles calcaneal spurs. Suggestion of avulsion fracture fragment off of the lateral aspect of the distal calcaneus. Soft tissues are unremarkable.  IMPRESSION: Suggestion of avulsion fragment over the distal aspect of the lateral calcaneus. Diffuse degenerative changes.   Electronically Signed   By: Burman Nieves M.D.   On: 05/31/2014 22:01    MDM   Final diagnoses:  None   1. Fibular fracture  Patient placed in CAM Walker, instructed to ambulate with her cane. Orthopedic follow up recommended this week for recheck and further management.  I personally performed the services described in this documentation, which was scribed in my presence. The recorded information has been reviewed and is accurate.       Elpidio Anis, PA-C 06/05/14 2113  Benjiman Core, MD 06/06/14 [phone]

## 2014-05-31 NOTE — ED Notes (Signed)
AVS explained in detail, no other c/c. Knows to follow up with orthopedics if symptoms worsen.

## 2014-06-25 ENCOUNTER — Encounter: Payer: Self-pay | Admitting: Family

## 2014-06-30 ENCOUNTER — Ambulatory Visit (HOSPITAL_COMMUNITY)
Admission: RE | Admit: 2014-06-30 | Discharge: 2014-06-30 | Disposition: A | Discharge status: Home / Self Care | Payer: Medicare Other | Source: Ambulatory Visit | Attending: Family | Admitting: Family

## 2014-06-30 ENCOUNTER — Ambulatory Visit (INDEPENDENT_AMBULATORY_CARE_PROVIDER_SITE_OTHER): Payer: Medicare Other | Admitting: Family

## 2014-06-30 ENCOUNTER — Encounter: Payer: Self-pay | Admitting: Family

## 2014-06-30 VITALS — BP 110/56 | HR 63 | Resp 14 | Ht 62.0 in | Wt 122.0 lb

## 2014-06-30 DIAGNOSIS — Z720 Tobacco use: Secondary | ICD-10-CM | POA: Diagnosis not present

## 2014-06-30 DIAGNOSIS — Z9889 Other specified postprocedural states: Secondary | ICD-10-CM

## 2014-06-30 DIAGNOSIS — I6523 Occlusion and stenosis of bilateral carotid arteries: Secondary | ICD-10-CM | POA: Diagnosis not present

## 2014-06-30 DIAGNOSIS — I6522 Occlusion and stenosis of left carotid artery: Secondary | ICD-10-CM | POA: Diagnosis not present

## 2014-06-30 DIAGNOSIS — F17200 Nicotine dependence, unspecified, uncomplicated: Secondary | ICD-10-CM

## 2014-06-30 DIAGNOSIS — Z48812 Encounter for surgical aftercare following surgery on the circulatory system: Secondary | ICD-10-CM | POA: Diagnosis not present

## 2014-06-30 NOTE — Progress Notes (Signed)
Established Carotid Patient   History of Present Illness  Vicki Hernandez is a 81 y.o. female patient of Dr. Hart Rochester who is s/p left carotid endarterectomy performed in March of 2013.  She returns today for follow up. Pt denies any history of stroke or TIA, denies claudication symptoms with walking.  The patient denies amaurosis fugax or monocular blindness. The patient denies facial drooping. Pt. denies hemiplegia. The patient denies receptive or expressive aphasia.  She denies any pain, tingling, numbness or weakness in arms or hands.  Patient denies New Medical or Surgical History.  Pt Diabetic: No Pt smoker: smoker (1/2 ppd, started at age 65 yrs)  Pt meds include: Statin : Yes ASA: Yes Other anticoagulants/antiplatelets: no    Past Medical History  Diagnosis Date  . Hypertension   . GERD (gastroesophageal reflux disease)   . Hyperlipidemia   . Macular degeneration   . Carotid artery bruit   . Complication of anesthesia     TAKES AWHILE TO WAKE UP   . Heart murmur   . Dysrhythmia     TACHYCARDIA   . Angina     3 WEEKS  . Accessory kidney   . H/O hiatal hernia   . Arthritis     OSTEO   . Atrial fibrillation 2014    Social History History  Substance Use Topics  . Smoking status: Light Tobacco Smoker -- 0.50 packs/day  . Smokeless tobacco: Never Used  . Alcohol Use: Yes     Comment: social    Family History Family History  Problem Relation Age of Onset  . Hyperlipidemia Father   . Hypertension Father   . Heart disease Father     Heart Disease before age 69  . Heart attack Father   . Stroke Mother     Surgical History Past Surgical History  Procedure Laterality Date  . Spine surgery  2002, 2010    Lumbar disk/ spine surgeries X 2   By Dr. Shon Baton  . Hemorrhoid surgery    . Knee surgery    . Cardiovascular stress test      5 YRS AGO     DR H SMITH  (NOW SEES DR Eldridge Dace)  . Joint replacement  2012    Left Knee  . Tonsillectomy    .  Appendectomy    . Cataract extraction w/ intraocular lens  implant, bilateral    . Endarterectomy  03/25/2011    Procedure: ENDARTERECTOMY CAROTID;  Surgeon: Pryor Ochoa, MD;  Location: [facility] OR;  Service: Vascular;  Laterality: Left;  Left Carotid Endarterectomy with dacron patch angioplasty, with resection of redundant common carotid with primary reanastamosis  . Carotid endarterectomy Left 03-25-11    cea  . Abdominal hysterectomy  1974    TAH w/ BSO  . Eye surgery      Allergies  Allergen Reactions  . Codeine Nausea Only  . Sulfa Antibiotics Other (See Comments)    Unknown reaction    Current Outpatient Prescriptions  Medication Sig Dispense Refill  . aspirin 81 MG tablet Take 81 mg by mouth every morning.     Marland Kitchen esomeprazole (NEXIUM) 20 MG capsule Take 20 mg by mouth daily at 12 noon.    Marland Kitchen HYDROcodone-acetaminophen (NORCO/VICODIN) 5-325 MG per tablet Take 1-2 tablets by mouth every 4 (four) hours as needed. 12 tablet 0  . ibuprofen (ADVIL,MOTRIN) 600 MG tablet Take 1 tablet (600 mg total) by mouth every 6 (six) hours as needed. 30 tablet 0  .  losartan-hydrochlorothiazide (HYZAAR) 50-12.5 MG per tablet Take 1 tablet by mouth every morning.     . metoprolol tartrate (LOPRESSOR) 25 MG tablet TAKE 1/2 TABLET TWICE DAILY, MAY TAKE AN EXTRA 1 TABLET AS NEEDED FOR PALPITATIONS 30 tablet 4  . Multiple Vitamins-Minerals (EYE-VITES PO) Take 1 tablet by mouth 2 (two) times daily.     . nitroGLYCERIN (NITROSTAT) 0.4 MG SL tablet Place 1 tablet (0.4 mg total) under the tongue every 5 (five) minutes as needed for chest pain. 25 tablet 5  . Polyethyl Glycol-Propyl Glycol (SYSTANE OP) Place 1-2 drops into both eyes daily as needed. For dry eyes    . pregabalin (LYRICA) 50 MG capsule Take 50 mg by mouth at bedtime.     . senna (SENOKOT) 8.6 MG tablet Take 1 tablet by mouth every morning.    . simvastatin (ZOCOR) 40 MG tablet Take 40 mg by mouth every evening.    . traMADol (ULTRAM) 50 MG tablet Take  50 mg by mouth every 6 (six) hours as needed. For pain    . VITAMIN D, ERGOCALCIFEROL, PO Take 1 capsule by mouth every morning.      No current facility-administered medications for this visit.    Review of Systems : See HPI for pertinent positives and negatives.  Physical Examination  There were no vitals filed for this visit. There is no weight on file to calculate BMI.  General: WDWN female in NAD GAIT: normal Eyes: PERRLA Pulmonary: Non-labored, diminished air movement in all lung fields, occasional moist cough. Cardiac: regular Rhythm, no detected murmur.  VASCULAR EXAM Carotid Bruits Left Right   Negative Negative   Radial pulses are 2+ palpable and equal.      LE Pulses LEFT RIGHT   POPLITEAL not palpable  not palpable   POSTERIOR TIBIAL not palpable  not palpable    DORSALIS PEDIS  ANTERIOR TIBIAL not palpable  palpable     Gastrointestinal: soft, nontender, BS WNL, no r/g,no palpable masses.  Musculoskeletal: Negative muscle atrophy/wasting. M/S 5/5 in UE's, 4/5 in LE's, Extremities without ischemic changes.  Neurologic: A&O X 3; Appropriate Affect, Speech is normal CN 2-12 intact, Pain and light touch intact in extremities, Motor exam as listed above.         Non-Invasive Vascular Imaging CAROTID DUPLEX 06/30/2014   CEREBROVASCULAR DUPLEX EVALUATION    INDICATION: Carotid artery disease     PREVIOUS INTERVENTION(S): Left carotid endarterectomy 03/25/2011    DUPLEX EXAM:     RIGHT  LEFT  Peak Systolic Velocities (cm/s) End Diastolic Velocities (cm/s) Plaque LOCATION Peak Systolic Velocities (cm/s) End Diastolic Velocities (cm/s) Plaque  64 18  CCA PROXIMAL 71 13   65 14  CCA MID 77 17 HT  60 14 HT CCA DISTAL 73 17   97 0 HT ECA 59 0   69 21 CP ICA PROXIMAL 67 12   79 22   ICA MID 57 18   75 19  ICA DISTAL 90 24     1.21 ICA / CCA Ratio (PSV) NA  Antegrade  Vertebral Flow Antegrade   125 Brachial Systolic Pressure (mmHg) 130  Multiphasic (Subclavian artery) Brachial Artery Waveforms Multiphasic (Subclavian artery)    Plaque Morphology:  HM = Homogeneous, HT = Heterogeneous, CP = Calcific Plaque, SP = Smooth Plaque, IP = Irregular Plaque  ADDITIONAL FINDINGS:     IMPRESSION: Patent left carotid endarterectomy site with no evidence of hyperplasia or restenosis.  Left internal carotid artery velocities suggest a 1-49%  stenosis.     Compared to the previous exam:        Assessment: Vicki Hernandez is a 81 y.o. female who is s/p left carotid endarterectomy performed in March of 2013. Pt denies any history of stroke or TIA. Today's carotid Duplex suggests a patent left carotid endarterectomy site with no evidence of hyperplasia or restenosis and minimal left ICA stenosis. No change compared to the previous Duplex a year ago.    Plan: Follow-up in 1 year with Carotid Duplex.   I discussed in depth with the patient the nature of atherosclerosis, and emphasized the importance of maximal medical management including strict control of blood pressure, blood glucose, and lipid levels, obtaining regular exercise, and cessation of smoking.  The patient is aware that without maximal medical management the underlying atherosclerotic disease process will progress, limiting the benefit of any interventions. The patient was given information about stroke prevention and what symptoms should prompt the patient to seek immediate medical care. Thank you for allowing [place] to participate in this patient's care.  Charisse March, RN, MSN, FNP-C [facility] Office: [phone]  Clinic Physician: Myra Gianotti  06/30/2014 1:46 PM

## 2014-06-30 NOTE — Patient Instructions (Signed)
Stroke Prevention Some medical conditions and behaviors are associated with an increased chance of having a stroke. You may prevent a stroke by making healthy choices and managing medical conditions. HOW CAN I REDUCE MY RISK OF HAVING A STROKE?   Stay physically active. Get at least 30 minutes of activity on most or all days.  Do not smoke. It may also be helpful to avoid exposure to secondhand smoke.  Limit alcohol use. Moderate alcohol use is considered to be:  No more than 2 drinks per day for men.  No more than 1 drink per day for nonpregnant women.  Eat healthy foods. This involves:  Eating 5 or more servings of fruits and vegetables a day.  Making dietary changes that address high blood pressure (hypertension), high cholesterol, diabetes, or obesity.  Manage your cholesterol levels.  Making food choices that are high in fiber and low in saturated fat, trans fat, and cholesterol may control cholesterol levels.  Take any prescribed medicines to control cholesterol as directed by your health care provider.  Manage your diabetes.  Controlling your carbohydrate and sugar intake is recommended to manage diabetes.  Take any prescribed medicines to control diabetes as directed by your health care provider.  Control your hypertension.  Making food choices that are low in salt (sodium), saturated fat, trans fat, and cholesterol is recommended to manage hypertension.  Take any prescribed medicines to control hypertension as directed by your health care provider.  Maintain a healthy weight.  Reducing calorie intake and making food choices that are low in sodium, saturated fat, trans fat, and cholesterol are recommended to manage weight.  Stop drug abuse.  Avoid taking birth control pills.  Talk to your health care provider about the risks of taking birth control pills if you are over 35 years old, smoke, get migraines, or have ever had a blood clot.  Get evaluated for sleep  disorders (sleep apnea).  Talk to your health care provider about getting a sleep evaluation if you snore a lot or have excessive sleepiness.  Take medicines only as directed by your health care provider.  For some people, aspirin or blood thinners (anticoagulants) are helpful in reducing the risk of forming abnormal blood clots that can lead to stroke. If you have the irregular heart rhythm of atrial fibrillation, you should be on a blood thinner unless there is a good reason you cannot take them.  Understand all your medicine instructions.  Make sure that other conditions (such as anemia or atherosclerosis) are addressed. SEEK IMMEDIATE MEDICAL CARE IF:   You have sudden weakness or numbness of the face, arm, or leg, especially on one side of the body.  Your face or eyelid droops to one side.  You have sudden confusion.  You have trouble speaking (aphasia) or understanding.  You have sudden trouble seeing in one or both eyes.  You have sudden trouble walking.  You have dizziness.  You have a loss of balance or coordination.  You have a sudden, severe headache with no known cause.  You have new chest pain or an irregular heartbeat. Any of these symptoms may represent a serious problem that is an emergency. Do not wait to see if the symptoms will go away. Get medical help at once. Call your local emergency services (911 in [place] Do not drive yourself to the hospital. Document Released: 02/11/2004 Document Revised: 05/20/2013 Document Reviewed: 07/06/2012 ExitCare Patient Information 2015 [employer]. This information is not intended to replace advice given   to you by your health care provider. Make sure you discuss any questions you have with your health care provider.    Smoking Cessation Quitting smoking is important to your health and has many advantages. However, it is not always easy to quit since nicotine is a very addictive drug. Oftentimes, people try 3 times or  more before being able to quit. This document explains the best ways for you to prepare to quit smoking. Quitting takes hard work and a lot of effort, but you can do it. ADVANTAGES OF QUITTING SMOKING  You will live longer, feel better, and live better.  Your body will feel the impact of quitting smoking almost immediately.  Within 20 minutes, blood pressure decreases. Your pulse returns to its normal level.  After 8 hours, carbon monoxide levels in the blood return to normal. Your oxygen level increases.  After 24 hours, the chance of having a heart attack starts to decrease. Your breath, hair, and body stop smelling like smoke.  After 48 hours, damaged nerve endings begin to recover. Your sense of taste and smell improve.  After 72 hours, the body is virtually free of nicotine. Your bronchial tubes relax and breathing becomes easier.  After 2 to 12 weeks, lungs can hold more air. Exercise becomes easier and circulation improves.  The risk of having a heart attack, stroke, cancer, or lung disease is greatly reduced.  After 1 year, the risk of coronary heart disease is cut in half.  After 5 years, the risk of stroke falls to the same as a nonsmoker.  After 10 years, the risk of lung cancer is cut in half and the risk of other cancers decreases significantly.  After 15 years, the risk of coronary heart disease drops, usually to the level of a nonsmoker.  If you are pregnant, quitting smoking will improve your chances of having a healthy baby.  The people you live with, especially any children, will be healthier.  You will have extra money to spend on things other than cigarettes. QUESTIONS TO THINK ABOUT BEFORE ATTEMPTING TO QUIT You may want to talk about your answers with your health care provider.  Why do you want to quit?  If you tried to quit in the past, what helped and what did not?  What will be the most difficult situations for you after you quit? How will you plan to  handle them?  Who can help you through the tough times? Your family? Friends? A health care provider?  What pleasures do you get from smoking? What ways can you still get pleasure if you quit? Here are some questions to ask your health care provider:  How can you help me to be successful at quitting?  What medicine do you think would be best for me and how should I take it?  What should I do if I need more help?  What is smoking withdrawal like? How can I get information on withdrawal? GET READY  Set a quit date.  Change your environment by getting rid of all cigarettes, ashtrays, matches, and lighters in your home, car, or work. Do not let people smoke in your home.  Review your past attempts to quit. Think about what worked and what did not. GET SUPPORT AND ENCOURAGEMENT You have a better chance of being successful if you have help. You can get support in many ways.  Tell your family, friends, and coworkers that you are going to quit and need their support. Ask them   not to smoke around you.  Get individual, group, or telephone counseling and support. Programs are available at local hospitals and health centers. Call your local health department for information about programs in your area.  Spiritual beliefs and practices may help some smokers quit.  Download a "quit meter" on your computer to keep track of quit statistics, such as how long you have gone without smoking, cigarettes not smoked, and money saved.  Get a self-help book about quitting smoking and staying off tobacco. LEARN NEW SKILLS AND BEHAVIORS  Distract yourself from urges to smoke. Talk to someone, go for a walk, or occupy your time with a task.  Change your normal routine. Take a different route to work. Drink tea instead of coffee. Eat breakfast in a different place.  Reduce your stress. Take a hot bath, exercise, or read a book.  Plan something enjoyable to do every day. Reward yourself for not  smoking.  Explore interactive web-based programs that specialize in helping you quit. GET MEDICINE AND USE IT CORRECTLY Medicines can help you stop smoking and decrease the urge to smoke. Combining medicine with the above behavioral methods and support can greatly increase your chances of successfully quitting smoking.  Nicotine replacement therapy helps deliver nicotine to your body without the negative effects and risks of smoking. Nicotine replacement therapy includes nicotine gum, lozenges, inhalers, nasal sprays, and skin patches. Some may be available over-the-counter and others require a prescription.  Antidepressant medicine helps people abstain from smoking, but how this works is unknown. This medicine is available by prescription.  Nicotinic receptor partial agonist medicine simulates the effect of nicotine in your brain. This medicine is available by prescription. Ask your health care provider for advice about which medicines to use and how to use them based on your health history. Your health care provider will tell you what side effects to look out for if you choose to be on a medicine or therapy. Carefully read the information on the package. Do not use any other product containing nicotine while using a nicotine replacement product.  RELAPSE OR DIFFICULT SITUATIONS Most relapses occur within the first 3 months after quitting. Do not be discouraged if you start smoking again. Remember, most people try several times before finally quitting. You may have symptoms of withdrawal because your body is used to nicotine. You may crave cigarettes, be irritable, feel very hungry, cough often, get headaches, or have difficulty concentrating. The withdrawal symptoms are only temporary. They are strongest when you first quit, but they will go away within 10-14 days. To reduce the chances of relapse, try to:  Avoid drinking alcohol. Drinking lowers your chances of successfully quitting.  Reduce the  amount of caffeine you consume. Once you quit smoking, the amount of caffeine in your body increases and can give you symptoms, such as a rapid heartbeat, sweating, and anxiety.  Avoid smokers because they can make you want to smoke.  Do not let weight gain distract you. Many smokers will gain weight when they quit, usually less than 10 pounds. Eat a healthy diet and stay active. You can always lose the weight gained after you quit.  Find ways to improve your mood other than smoking. FOR MORE INFORMATION  www.smokefree.gov  Document Released: 12/28/2000 Document Revised: 05/20/2013 Document Reviewed: 04/14/2011 ExitCare Patient Information 2015 [employer]. This information is not intended to replace advice given to you by your health care provider. Make sure you discuss any questions you have with your health   care provider.    Smoking Cessation, Tips for Success If you are ready to quit smoking, congratulations! You have chosen to help yourself be healthier. Cigarettes bring nicotine, tar, carbon monoxide, and other irritants into your body. Your lungs, heart, and blood vessels will be able to work better without these poisons. There are many different ways to quit smoking. Nicotine gum, nicotine patches, a nicotine inhaler, or nicotine nasal spray can help with physical craving. Hypnosis, support groups, and medicines help break the habit of smoking. WHAT THINGS CAN I DO TO MAKE QUITTING EASIER?  Here are some tips to help you quit for good:  Pick a date when you will quit smoking completely. Tell all of your friends and family about your plan to quit on that date.  Do not try to slowly cut down on the number of cigarettes you are smoking. Pick a quit date and quit smoking completely starting on that day.  Throw away all cigarettes.   Clean and remove all ashtrays from your home, work, and car.  On a card, write down your reasons for quitting. Carry the card with you and read it  when you get the urge to smoke.  Cleanse your body of nicotine. Drink enough water and fluids to keep your urine clear or pale yellow. Do this after quitting to flush the nicotine from your body.  Learn to predict your moods. Do not let a bad situation be your excuse to have a cigarette. Some situations in your life might tempt you into wanting a cigarette.  Never have "just one" cigarette. It leads to wanting another and another. Remind yourself of your decision to quit.  Change habits associated with smoking. If you smoked while driving or when feeling stressed, try other activities to replace smoking. Stand up when drinking your coffee. Brush your teeth after eating. Sit in a different chair when you read the paper. Avoid alcohol while trying to quit, and try to drink fewer caffeinated beverages. Alcohol and caffeine may urge you to smoke.  Avoid foods and drinks that can trigger a desire to smoke, such as sugary or spicy foods and alcohol.  Ask people who smoke not to smoke around you.  Have something planned to do right after eating or having a cup of coffee. For example, plan to take a walk or exercise.  Try a relaxation exercise to calm you down and decrease your stress. Remember, you may be tense and nervous for the first 2 weeks after you quit, but this will pass.  Find new activities to keep your hands busy. Play with a pen, coin, or rubber band. Doodle or draw things on paper.  Brush your teeth right after eating. This will help cut down on the craving for the taste of tobacco after meals. You can also try mouthwash.   Use oral substitutes in place of cigarettes. Try using lemon drops, carrots, cinnamon sticks, or chewing gum. Keep them handy so they are available when you have the urge to smoke.  When you have the urge to smoke, try deep breathing.  Designate your home as a nonsmoking area.  If you are a heavy smoker, ask your health care provider about a prescription for  nicotine chewing gum. It can ease your withdrawal from nicotine.  Reward yourself. Set aside the cigarette money you save and buy yourself something nice.  Look for support from others. Join a support group or smoking cessation program. Ask someone at home or at work   to help you with your plan to quit smoking.  Always ask yourself, "Do I need this cigarette or is this just a reflex?" Tell yourself, "Today, I choose not to smoke," or "I do not want to smoke." You are reminding yourself of your decision to quit.  Do not replace cigarette smoking with electronic cigarettes (commonly called e-cigarettes). The safety of e-cigarettes is unknown, and some may contain harmful chemicals.  If you relapse, do not give up! Plan ahead and think about what you will do the next time you get the urge to smoke. HOW WILL I FEEL WHEN I QUIT SMOKING? You may have symptoms of withdrawal because your body is used to nicotine (the addictive substance in cigarettes). You may crave cigarettes, be irritable, feel very hungry, cough often, get headaches, or have difficulty concentrating. The withdrawal symptoms are only temporary. They are strongest when you first quit but will go away within 10-14 days. When withdrawal symptoms occur, stay in control. Think about your reasons for quitting. Remind yourself that these are signs that your body is healing and getting used to being without cigarettes. Remember that withdrawal symptoms are easier to treat than the major diseases that smoking can cause.  Even after the withdrawal is over, expect periodic urges to smoke. However, these cravings are generally short lived and will go away whether you smoke or not. Do not smoke! WHAT RESOURCES ARE AVAILABLE TO HELP ME QUIT SMOKING? Your health care provider can direct you to community resources or hospitals for support, which may include:  Group support.  Education.  Hypnosis.  Therapy. Document Released: 10/02/2003 Document  Revised: 05/20/2013 Document Reviewed: 06/21/2012 ExitCare Patient Information 2015 [employer]. This information is not intended to replace advice given to you by your health care provider. Make sure you discuss any questions you have with your health care provider.  

## 2014-06-30 NOTE — Addendum Note (Signed)
Addended by: Adria Dill L on: 06/30/2014 04:31 PM   Modules accepted: Orders

## 2014-09-10 ENCOUNTER — Encounter: Payer: Self-pay | Admitting: Interventional Cardiology

## 2014-10-08 ENCOUNTER — Other Ambulatory Visit (HOSPITAL_COMMUNITY): Payer: Self-pay | Admitting: *Deleted

## 2014-10-09 ENCOUNTER — Encounter (HOSPITAL_COMMUNITY)
Admission: RE | Admit: 2014-10-09 | Discharge: 2014-10-09 | Disposition: A | Discharge status: Home / Self Care | Payer: Medicare Other | Source: Ambulatory Visit | Attending: Rheumatology | Admitting: Rheumatology

## 2014-10-09 DIAGNOSIS — M810 Age-related osteoporosis without current pathological fracture: Secondary | ICD-10-CM | POA: Diagnosis present

## 2014-10-09 MED ORDER — ZOLEDRONIC ACID 5 MG/100ML IV SOLN
INTRAVENOUS | Status: AC
  Filled 2014-10-09: qty 100

## 2014-10-09 MED ORDER — ZOLEDRONIC ACID 5 MG/100ML IV SOLN
5.0000 mg | Freq: Once | INTRAVENOUS | Status: AC
  Administered 2014-10-09: 5 mg via INTRAVENOUS

## 2014-10-09 NOTE — Discharge Instructions (Signed)

## 2014-10-13 ENCOUNTER — Other Ambulatory Visit: Payer: Self-pay | Admitting: Interventional Cardiology

## 2015-01-29 ENCOUNTER — Ambulatory Visit (INDEPENDENT_AMBULATORY_CARE_PROVIDER_SITE_OTHER): Payer: Medicare Other | Admitting: Interventional Cardiology

## 2015-01-29 ENCOUNTER — Encounter: Payer: Self-pay | Admitting: Interventional Cardiology

## 2015-01-29 VITALS — BP 120/70 | HR 70 | Ht 62.0 in | Wt 119.0 lb

## 2015-01-29 DIAGNOSIS — I739 Peripheral vascular disease, unspecified: Secondary | ICD-10-CM | POA: Diagnosis not present

## 2015-01-29 DIAGNOSIS — I10 Essential (primary) hypertension: Secondary | ICD-10-CM

## 2015-01-29 DIAGNOSIS — F17200 Nicotine dependence, unspecified, uncomplicated: Secondary | ICD-10-CM | POA: Diagnosis not present

## 2015-01-29 DIAGNOSIS — I471 Supraventricular tachycardia: Secondary | ICD-10-CM

## 2015-01-29 NOTE — Patient Instructions (Signed)
**Note De-identified Vicki Hernandez Obfuscation** Medication Instructions:  Same-no changes  Labwork: None  Testing/Procedures: None  Follow-Up: Your physician wants you to follow-up in: 1 year. You will receive a reminder letter in the mail two months in advance. If you don't receive a letter, please call our office to schedule the follow-up appointment.      If you need a refill on your cardiac medications before your next appointment, please call your pharmacy.   

## 2015-01-29 NOTE — Progress Notes (Signed)
Patient ID: Vicki Hernandez, female   DOB: 04/10/1928, 82 y.o.   MRN: [id number]     Cardiology Office Note   Date:  01/29/2015   ID:  Vicki Hernandez, DOB 07/03/1928, MRN [id number]  PCP:   Duane Lopeoss, Alan, MD    No chief complaint on file.    Wt Readings from Last 3 Encounters:  01/29/15 119 lb (53.978 kg)  06/30/14 122 lb (55.339 kg)  12/10/13 124 lb 1.9 oz (56.3 kg)       History of Present Illness: Vicki Hernandez is a 82 y.o. female  who has had CEA, after having a knot and bruit noted in 2013. She had a rapid pulse in 2014 while at the vascular surgeon. Sx. resolved prior to ECG. She wore a monitor in 2014. She did not have any palpitations at that time while wearing the monitor. No arrhythmia noted at that time. She has been taken to the hospital years ago for this. She has used a beta blocker as needed which quickly resolves her symptoms. Episodes are infrequent and therefore, she is not interested in switching her medication dosage at this time. They do not limit her. When she does take an extra metoprolol, symptoms resolved.  Walking is the most strenuous exercise.  No GERD sx with walking.  GERD seems to be the trigger for her SVT.  Quickly resolves with metoprolol.  SHe does not want to increase the dose at this time.   Past Medical History  Diagnosis Date  . Hypertension   . GERD (gastroesophageal reflux disease)   . Hyperlipidemia   . Macular degeneration   . Carotid artery bruit   . Complication of anesthesia     TAKES AWHILE TO WAKE UP   . Heart murmur   . Dysrhythmia     TACHYCARDIA   . Angina     3 WEEKS  . Accessory kidney   . H/O hiatal hernia   . Arthritis     OSTEO   . Atrial fibrillation (HCC) 2014    Past Surgical History  Procedure Laterality Date  . Spine surgery  2002, 2010    Lumbar disk/ spine surgeries X 2   By Dr. Shon BatonBrooks  . Hemorrhoid surgery    . Knee surgery    . Cardiovascular stress test      5 YRS AGO     DR H SMITH  (NOW  SEES DR Eldridge DaceVARANASI)  . Joint replacement  2012    Left Knee  . Tonsillectomy    . Appendectomy    . Cataract extraction w/ intraocular lens  implant, bilateral    . Endarterectomy  03/25/2011    Procedure: ENDARTERECTOMY CAROTID;  Surgeon: Pryor OchoaJames D Lawson, MD;  [facility];  Service: Vascular;  Laterality: Left;  Left Carotid Endarterectomy with dacron patch angioplasty, with resection of redundant common carotid with primary reanastamosis  . Carotid endarterectomy Left 03-25-11    cea  . Abdominal hysterectomy  1974    TAH w/ BSO  . Eye surgery       Current Outpatient Prescriptions  Medication Sig Dispense Refill  . aspirin 81 MG tablet Take 81 mg by mouth every morning.     Marland Kitchen. esomeprazole (NEXIUM) 20 MG capsule Take 20 mg by mouth daily at 12 noon.    Marland Kitchen. losartan-hydrochlorothiazide (HYZAAR) 50-12.5 MG per tablet Take 1 tablet by mouth every morning.     Marland Kitchen. LYRICA 75 MG capsule Take 75 mg by mouth 2 (  two) times daily.  0  . metoprolol tartrate (LOPRESSOR) 25 MG tablet TAKE 1/2 TABLET TWICE DAILY, MAY TAKE AN EXTRA 1 TABLET AS NEEDED FOR PALPITATIONS 30 tablet 4  . Multiple Vitamins-Minerals (EYE-VITES PO) Take 1 tablet by mouth 2 (two) times daily.     Marland Kitchen NITROSTAT 0.4 MG SL tablet PLACE 1 TABLET UNDER THE TONGUE EVERY 5 MINS AS NEEDED FOR CHEST PAIN 25 tablet 1  . Polyethyl Glycol-Propyl Glycol (SYSTANE OP) Place 1-2 drops into both eyes daily as needed. For dry eyes    . senna (SENOKOT) 8.6 MG tablet Take 1 tablet by mouth every morning.    . simvastatin (ZOCOR) 40 MG tablet Take 40 mg by mouth every evening.    . traMADol (ULTRAM) 50 MG tablet Take 50 mg by mouth every 6 (six) hours as needed. For pain    . VITAMIN D, ERGOCALCIFEROL, PO Take 1 capsule by mouth every morning.      No current facility-administered medications for this visit.    Allergies:   Sulfa antibiotics and Codeine    Social History:  The patient  reports that she has been smoking Cigarettes.  She has been  smoking about 0.50 packs per day. She has never used smokeless tobacco. She reports that she drinks alcohol. She reports that she does not use illicit drugs.   Family History:  The patient's family history includes Heart attack in her father; Heart disease in her father and mother; Hyperlipidemia in her father; Hypertension in her father; Stroke in her mother.    ROS:  Please see the history of present illness.   Otherwise, review of systems are positive for no desire to stop smoking.   All other systems are reviewed and negative.    PHYSICAL EXAM: VS:  BP 120/70 mmHg  Pulse 70  Ht 5\' 2"  (1.575 m)  Wt 119 lb (53.978 kg)  BMI 21.76 kg/m2 , BMI Body mass index is 21.76 kg/(m^2). GEN: Well nourished, well developed, in no acute distress HEENT: normal Neck: no JVD, soft bilateral carotid bruits, or masses Cardiac: RRR; no murmurs, rubs, or gallops,no edema  Respiratory:  clear to auscultation bilaterally, normal work of breathing GI: soft, nontender, nondistended, + BS MS: no deformity or atrophy Skin: warm and dry, no rash Neuro:  Strength and sensation are intact Psych: euthymic mood, full affect   EKG:   The ekg ordered today demonstrates NSR, no ST segment changes   Recent Labs: No results found for requested labs within last 365 days.   Lipid Panel No results found for: CHOL, TRIG, HDL, CHOLHDL, VLDL, LDLCALC, LDLDIRECT   Other studies Reviewed: Additional studies/ records that were reviewed today with results demonstrating: Normal nuclear stress test in December 2014.   ASSESSMENT AND PLAN:  1. SVT: Symptoms controlled. Continue metoprolol. We discussed changing her dose to try and suppress the SVG to cause her episodes. She is not interested at this time.  2. PAD: Carotid artery disease. Followed by Dr. Hart Rochester. 3. Tobacco abuse: I would recommend that she stop smoking. She is not interested in stopping" at this stage of life." 4. Essential HTN: Controlled. Continue  current medicines. She does not check at home. She only gets it checked at other doctor's offices.   Current medicines are reviewed at length with the patient today.  The patient concerns regarding her medicines were addressed.  The following changes have been made:  No change  Labs/ tests ordered today include:  No orders of  the defined types were placed in this encounter.    Recommend 150 minutes/week of aerobic exercise Low fat, low carb, high fiber diet recommended  Disposition:   FU in one year   Delorise Jackson., MD  01/29/2015 9:33 AM    [facility] [address] [place], [place], [place]  [address] Phone: [phone]; Fax: [phone]

## 2015-06-29 ENCOUNTER — Encounter: Payer: Self-pay | Admitting: Family

## 2015-07-06 ENCOUNTER — Encounter: Payer: Self-pay | Admitting: Family

## 2015-07-06 ENCOUNTER — Encounter (HOSPITAL_COMMUNITY): Payer: Medicare Other

## 2015-07-06 ENCOUNTER — Ambulatory Visit: Payer: Medicare Other | Admitting: Family

## 2015-07-06 ENCOUNTER — Ambulatory Visit (INDEPENDENT_AMBULATORY_CARE_PROVIDER_SITE_OTHER): Payer: Medicare Other | Admitting: Family

## 2015-07-06 ENCOUNTER — Ambulatory Visit (HOSPITAL_COMMUNITY)
Admission: RE | Admit: 2015-07-06 | Discharge: 2015-07-06 | Disposition: A | Discharge status: Home / Self Care | Payer: Medicare Other | Source: Ambulatory Visit | Attending: Family | Admitting: Family

## 2015-07-06 VITALS — BP 103/53 | HR 54 | Temp 97.8°F | Resp 14 | Ht 62.0 in | Wt 118.0 lb

## 2015-07-06 DIAGNOSIS — I10 Essential (primary) hypertension: Secondary | ICD-10-CM | POA: Diagnosis not present

## 2015-07-06 DIAGNOSIS — E785 Hyperlipidemia, unspecified: Secondary | ICD-10-CM | POA: Diagnosis not present

## 2015-07-06 DIAGNOSIS — Z9889 Other specified postprocedural states: Secondary | ICD-10-CM

## 2015-07-06 DIAGNOSIS — I6522 Occlusion and stenosis of left carotid artery: Secondary | ICD-10-CM

## 2015-07-06 DIAGNOSIS — Z48812 Encounter for surgical aftercare following surgery on the circulatory system: Secondary | ICD-10-CM

## 2015-07-06 DIAGNOSIS — F17200 Nicotine dependence, unspecified, uncomplicated: Secondary | ICD-10-CM

## 2015-07-06 DIAGNOSIS — I6521 Occlusion and stenosis of right carotid artery: Secondary | ICD-10-CM | POA: Diagnosis not present

## 2015-07-06 DIAGNOSIS — K219 Gastro-esophageal reflux disease without esophagitis: Secondary | ICD-10-CM | POA: Diagnosis not present

## 2015-07-06 DIAGNOSIS — Z720 Tobacco use: Secondary | ICD-10-CM | POA: Diagnosis not present

## 2015-07-06 NOTE — Progress Notes (Signed)
Chief Complaint: Follow up Extracranial Carotid Artery Stenosis   History of Present Illness  Vicki Hernandez is a 82 y.o. female patient of Dr. Hart Rochester who is s/p left carotid endarterectomy performed in March of 2013.  She returns today for follow up. Pt denies any history of stroke or TIA, denies claudication symptoms with walking.  Specifically she denies a history of amaurosis fugax or monocular blindness, unilateral facial drooping, hemiplegia/hemiparesis, or receptive or expressive aphasia.  She denies any pain, tingling, numbness or weakness in arms or hands.  Patientdenies New Medical or Surgical History.  Pt Diabetic: No Pt smoker: smoker (1/2 ppd, started at age 27 yrs)  Pt meds include: Statin : Yes ASA: Yes Other anticoagulants/antiplatelets: no    Past Medical History  Diagnosis Date  . Hypertension   . GERD (gastroesophageal reflux disease)   . Hyperlipidemia   . Macular degeneration   . Carotid artery bruit   . Complication of anesthesia     TAKES AWHILE TO WAKE UP   . Heart murmur   . Dysrhythmia     TACHYCARDIA   . Angina     3 WEEKS  . Accessory kidney   . H/O hiatal hernia   . Arthritis     OSTEO   . Atrial fibrillation (HCC) 2014    Social History Social History  Substance Use Topics  . Smoking status: Light Tobacco Smoker -- 0.50 packs/day    Types: Cigarettes  . Smokeless tobacco: Never Used  . Alcohol Use: Yes     Comment: social    Family History Family History  Problem Relation Age of Onset  . Hyperlipidemia Father   . Hypertension Father   . Heart disease Father     Heart Disease before age 54-  Bleeding problems  . Heart attack Father   . Stroke Mother   . Heart disease Mother     After age 48    Surgical History Past Surgical History  Procedure Laterality Date  . Spine surgery  2002, 2010    Lumbar disk/ spine surgeries X 2   By Dr. Shon Baton  . Hemorrhoid surgery    . Knee surgery    . Cardiovascular  stress test      5 YRS AGO     DR H SMITH  (NOW SEES DR Eldridge Dace)  . Joint replacement  2012    Left Knee  . Tonsillectomy    . Appendectomy    . Cataract extraction w/ intraocular lens  implant, bilateral    . Endarterectomy  03/25/2011    Procedure: ENDARTERECTOMY CAROTID;  Surgeon: Pryor Ochoa, MD;  Location: [facility] OR;  Service: Vascular;  Laterality: Left;  Left Carotid Endarterectomy with dacron patch angioplasty, with resection of redundant common carotid with primary reanastamosis  . Carotid endarterectomy Left 03-25-11    cea  . Abdominal hysterectomy  1974    TAH w/ BSO  . Eye surgery      Allergies  Allergen Reactions  . Sulfa Antibiotics Itching  . Codeine Nausea Only  . Cymbalta [Duloxetine Hcl]     Current Outpatient Prescriptions  Medication Sig Dispense Refill  . aspirin 81 MG tablet Take 81 mg by mouth every morning.     Marland Kitchen esomeprazole (NEXIUM) 20 MG capsule Take 20 mg by mouth daily at 12 noon.    Marland Kitchen losartan-hydrochlorothiazide (HYZAAR) 50-12.5 MG per tablet Take 1 tablet by mouth every morning.     Marland Kitchen LYRICA 75 MG capsule Take  75 mg by mouth 2 (two) times daily.  0  . metoprolol tartrate (LOPRESSOR) 25 MG tablet TAKE 1/2 TABLET TWICE DAILY, MAY TAKE AN EXTRA 1 TABLET AS NEEDED FOR PALPITATIONS 30 tablet 4  . Multiple Vitamins-Minerals (EYE-VITES PO) Take 1 tablet by mouth daily.     Marland Kitchen. NITROSTAT 0.4 MG SL tablet PLACE 1 TABLET UNDER THE TONGUE EVERY 5 MINS AS NEEDED FOR CHEST PAIN 25 tablet 1  . Polyethyl Glycol-Propyl Glycol (SYSTANE OP) Place 1-2 drops into both eyes daily as needed. For dry eyes    . senna (SENOKOT) 8.6 MG tablet Take 1 tablet by mouth every morning.    . simvastatin (ZOCOR) 40 MG tablet Take 40 mg by mouth every evening.    . traMADol (ULTRAM) 50 MG tablet Take 50 mg by mouth every 6 (six) hours as needed. For pain    . VITAMIN D, ERGOCALCIFEROL, PO Take 1 capsule by mouth every morning.      No current facility-administered medications for this  visit.    Review of Systems : See HPI for pertinent positives and negatives.  Physical Examination  Filed Vitals:   07/06/15 1132  BP: 103/53  Pulse: 54  Temp: 97.8 F (36.6 C)  TempSrc: Oral  Resp: 14  Height: 5\' 2"  (1.575 m)  Weight: 118 lb (53.524 kg)  SpO2: 97%   Body mass index is 21.58 kg/(m^2).  General: WDWN female in NAD GAIT: normal Eyes: PERRLA Pulmonary: Respirations are non-labored, diminished air movement in all lung fields, occasional moist cough. Cardiac: regular rhythm, no detected murmur.  VASCULAR EXAM Carotid Bruits Left Right   Negative Negative   Radial pulses are 2+ palpable and equal.      LE Pulses LEFT RIGHT   POPLITEAL not palpable  not palpable   POSTERIOR TIBIAL not palpable  not palpable    DORSALIS PEDIS  ANTERIOR TIBIAL not palpable  palpable     Gastrointestinal: soft, nontender, BS WNL, no r/g,no palpable masses.  Musculoskeletal: No muscle atrophy/wasting. M/S 5/5 in UE's, 4/5 in LE's, Extremities without ischemic changes. Moderate kyphosis.   Neurologic: A&O X 3; Appropriate Affect, Speech is normal CN 2-12 intact, Pain and light touch intact in extremities, Motor exam as listed above.               Non-Invasive Vascular Imaging CAROTID DUPLEX 07/06/2015   CEREBROVASCULAR DUPLEX EVALUATION    INDICATION: Carotid artery disease    PREVIOUS INTERVENTION(S): Left carotid endarterectomy 03/25/2011    DUPLEX EXAM: Carotid duplex    RIGHT  LEFT  Peak Systolic Velocities (cm/s) End Diastolic Velocities (cm/s) Plaque LOCATION Peak Systolic Velocities (cm/s) End Diastolic Velocities (cm/s) Plaque  70 11  CCA PROXIMAL 93 16   70 14  CCA MID 91 18   60 14 CP CCA DISTAL 75 14 HM  74 6  ECA 91 6   117 21 CP ICA PROXIMAL 67 18   110  22  ICA MID 114 29   82 20  ICA DISTAL 92 22     1.6 ICA / CCA Ratio (PSV) NA  Antegrade Vertebral Flow Antegrade  - Brachial Systolic Pressure (mmHg)   Triphasic Brachial Artery Waveforms Triphasic    Plaque Morphology:  HM = Homogeneous, HT = Heterogeneous, CP = Calcific Plaque, SP = Smooth Plaque, IP = Irregular Plaque     ADDITIONAL FINDINGS: Biphasic right subclavian artery, biphasic to triphasic left subclavian artery    IMPRESSION: 1. Less than 40% right  internal carotid artery stenosis, calcific plaque may obscure higher velocity 2. Patent left carotid endarterectomy site with no evidence for restenosis.    Compared to the previous exam:  No change since exam of 06/30/2014     Assessment: KISHANA BATTEY is a 82 y.o. female who is s/p left carotid endarterectomy performed in March of 2013. Pt denies any history of stroke or TIA. Today's carotid Duplex suggests a patent left carotid endarterectomy site with no evidence of hyperplasia or restenosis and minimal left ICA stenosis. No change compared to the previous Duplex a year ago.  Fortunately she does not have DM but unfortunately she continues to smoke, see Plan.  Plan: Follow-up in 1 year with Carotid Duplex scan.  The patient was counseled re smoking cessation and given several free resources re smoking cessation.    I discussed in depth with the patient the nature of atherosclerosis, and emphasized the importance of maximal medical management including strict control of blood pressure, blood glucose, and lipid levels, obtaining regular exercise, and cessation of smoking.  The patient is aware that without maximal medical management the underlying atherosclerotic disease process will progress, limiting the benefit of any interventions. The patient was given information about stroke prevention and what symptoms should prompt the patient to seek immediate medical care. Thank you for allowing [place] to participate in this  patient's care.  Charisse March, RN, MSN, FNP-C [facility] Office: [phone]  Clinic Physician: Myra Gianotti  07/06/2015 11:41 AM

## 2015-07-06 NOTE — Patient Instructions (Signed)
Stroke Prevention Some medical conditions and behaviors are associated with an increased chance of having a stroke. You may prevent a stroke by making healthy choices and managing medical conditions. HOW CAN I REDUCE MY RISK OF HAVING A STROKE?   Stay physically active. Get at least 30 minutes of activity on most or all days.  Do not smoke. It may also be helpful to avoid exposure to secondhand smoke.  Limit alcohol use. Moderate alcohol use is considered to be:  No more than 2 drinks per day for men.  No more than 1 drink per day for nonpregnant women.  Eat healthy foods. This involves:  Eating 5 or more servings of fruits and vegetables a day.  Making dietary changes that address high blood pressure (hypertension), high cholesterol, diabetes, or obesity.  Manage your cholesterol levels.  Making food choices that are high in fiber and low in saturated fat, trans fat, and cholesterol may control cholesterol levels.  Take any prescribed medicines to control cholesterol as directed by your health care provider.  Manage your diabetes.  Controlling your carbohydrate and sugar intake is recommended to manage diabetes.  Take any prescribed medicines to control diabetes as directed by your health care provider.  Control your hypertension.  Making food choices that are low in salt (sodium), saturated fat, trans fat, and cholesterol is recommended to manage hypertension.  Ask your health care provider if you need treatment to lower your blood pressure. Take any prescribed medicines to control hypertension as directed by your health care provider.  If you are 18-39 years of age, have your blood pressure checked every 3-5 years. If you are 40 years of age or older, have your blood pressure checked every year.  Maintain a healthy weight.  Reducing calorie intake and making food choices that are low in sodium, saturated fat, trans fat, and cholesterol are recommended to manage  weight.  Stop drug abuse.  Avoid taking birth control pills.  Talk to your health care provider about the risks of taking birth control pills if you are over 35 years old, smoke, get migraines, or have ever had a blood clot.  Get evaluated for sleep disorders (sleep apnea).  Talk to your health care provider about getting a sleep evaluation if you snore a lot or have excessive sleepiness.  Take medicines only as directed by your health care provider.  For some people, aspirin or blood thinners (anticoagulants) are helpful in reducing the risk of forming abnormal blood clots that can lead to stroke. If you have the irregular heart rhythm of atrial fibrillation, you should be on a blood thinner unless there is a good reason you cannot take them.  Understand all your medicine instructions.  Make sure that other conditions (such as anemia or atherosclerosis) are addressed. SEEK IMMEDIATE MEDICAL CARE IF:   You have sudden weakness or numbness of the face, arm, or leg, especially on one side of the body.  Your face or eyelid droops to one side.  You have sudden confusion.  You have trouble speaking (aphasia) or understanding.  You have sudden trouble seeing in one or both eyes.  You have sudden trouble walking.  You have dizziness.  You have a loss of balance or coordination.  You have a sudden, severe headache with no known cause.  You have new chest pain or an irregular heartbeat. Any of these symptoms may represent a serious problem that is an emergency. Do not wait to see if the symptoms will   go away. Get medical help at once. Call your local emergency services (911 in [place] Do not drive yourself to the hospital.   This information is not intended to replace advice given to you by your health care provider. Make sure you discuss any questions you have with your health care provider.   Document Released: 02/11/2004 Document Revised: 01/24/2014 Document Reviewed:  07/06/2012 Elsevier Interactive Patient Education 2016 [employer]  

## 2015-07-30 DIAGNOSIS — H353122 Nonexudative age-related macular degeneration, left eye, intermediate dry stage: Secondary | ICD-10-CM | POA: Insufficient documentation

## 2016-02-07 NOTE — Progress Notes (Signed)
Patient ID: Vicki Hernandez, female   DOB: [birth year], 83 y.o.   MRN: [id number]     Cardiology Office Note   Date:  02/08/2016   ID:  Vicki Hernandez, DOB 04/05/1928, MRN [id number]  PCP:  Duane Lope, MD    Chief Complaint  Patient presents with  . Carotid stenosis, bilateral     Wt Readings from Last 3 Encounters:  02/08/16 118 lb 1.9 oz (53.6 kg)  07/06/15 118 lb (53.5 kg)  01/29/15 119 lb (54 kg)       History of Present Illness: Vicki Hernandez is a 83 y.o. female  who has had CEA, after having a knot and bruit noted in 2013. She had a rapid pulse in 2014 while at the vascular surgeon. Sx. resolved prior to ECG. She wore a monitor in 2014. She did not have any palpitations at that time while wearing the monitor. No arrhythmia noted at that time. She has been taken to the hospital years ago for this. She has used a beta blocker as needed which quickly resolves her symptoms.   Walking is the most strenuous exercise.  She moved to [facility] and has to walk more to get to the dining hall.  No GERD sx with walking.  GERD seems to be the trigger for her SVT.  She has had GERD but this decreased by eating more salads.   SVT Quickly resolves with metoprolol.  SHe does not want to increase the dose at this time.   She stopped smoking cold [place].   Past Medical History:  Diagnosis Date  . Accessory kidney   . Angina    3 WEEKS  . Arthritis    OSTEO   . Atrial fibrillation (HCC) 2014  . Carotid artery bruit   . Complication of anesthesia    TAKES AWHILE TO WAKE UP   . Dysrhythmia    TACHYCARDIA   . GERD (gastroesophageal reflux disease)   . H/O hiatal hernia   . Heart murmur   . Hyperlipidemia   . Hypertension   . Macular degeneration     Past Surgical History:  Procedure Laterality Date  . ABDOMINAL HYSTERECTOMY  1974   TAH w/ BSO  . APPENDECTOMY    . CARDIOVASCULAR STRESS TEST     5 YRS AGO     DR H SMITH  (NOW SEES DR Eldridge Dace)  . CAROTID  ENDARTERECTOMY Left 03-25-11   cea  . CATARACT EXTRACTION W/ INTRAOCULAR LENS  IMPLANT, BILATERAL    . ENDARTERECTOMY  03/25/2011   Procedure: ENDARTERECTOMY CAROTID;  Surgeon: Pryor Ochoa, MD;  Location: [facility] OR;  Service: Vascular;  Laterality: Left;  Left Carotid Endarterectomy with dacron patch angioplasty, with resection of redundant common carotid with primary reanastamosis  . EYE SURGERY    . HEMORRHOID SURGERY    . JOINT REPLACEMENT  2012   Left Knee  . KNEE SURGERY    . SPINE SURGERY  2002, 2010   Lumbar disk/ spine surgeries X 2   By Dr. Shon Baton  . TONSILLECTOMY       Current Outpatient Prescriptions  Medication Sig Dispense Refill  . aspirin 81 MG tablet Take 81 mg by mouth every morning.     Marland Kitchen esomeprazole (NEXIUM) 20 MG capsule Take 20 mg by mouth daily at 12 noon.    Marland Kitchen losartan-hydrochlorothiazide (HYZAAR) 50-12.5 MG per tablet Take 1 tablet by mouth every morning.     Marland Kitchen LYRICA 75 MG capsule Take 75  mg by mouth 2 (two) times daily.  0  . metoprolol tartrate (LOPRESSOR) 25 MG tablet TAKE 1/2 TABLET TWICE DAILY, MAY TAKE AN EXTRA 1 TABLET AS NEEDED FOR PALPITATIONS 30 tablet 4  . Multiple Vitamins-Minerals (EYE-VITES PO) Take 1 tablet by mouth daily.     Marland Kitchen NITROSTAT 0.4 MG SL tablet PLACE 1 TABLET UNDER THE TONGUE EVERY 5 MINS AS NEEDED FOR CHEST PAIN 25 tablet 1  . Polyethyl Glycol-Propyl Glycol (SYSTANE OP) Place 1-2 drops into both eyes daily as needed. For dry eyes    . senna (SENOKOT) 8.6 MG tablet Take 1 tablet by mouth every morning.    . simvastatin (ZOCOR) 40 MG tablet Take 40 mg by mouth every evening.    . traMADol (ULTRAM) 50 MG tablet Take 50 mg by mouth every 6 (six) hours as needed. For pain    . VITAMIN D, ERGOCALCIFEROL, PO Take 1 capsule by mouth every morning.      No current facility-administered medications for this visit.     Allergies:   Sulfa antibiotics; Codeine; and Cymbalta [duloxetine hcl]    Social History:  The patient  reports that she has  been smoking Cigarettes.  She has been smoking about 0.50 packs per day. She has never used smokeless tobacco. She reports that she drinks alcohol. She reports that she does not use drugs.   Family History:  The patient's family history includes Heart attack in her father; Heart disease in her father and mother; Hyperlipidemia in her father; Hypertension in her father; Stroke in her mother.    ROS:  Please see the history of present illness.   Otherwise, review of systems are positive for no desire to stop smoking.   All other systems are reviewed and negative.    PHYSICAL EXAM: VS:  BP 120/60   Pulse 72   Ht 5\' 2"  (1.575 m)   Wt 118 lb 1.9 oz (53.6 kg)   SpO2 95%   BMI 21.60 kg/m  , BMI Body mass index is 21.6 kg/m. GEN: Well nourished, well developed, in no acute distress  HEENT: normal  Neck: no JVD, soft bilateral carotid bruits, or masses Cardiac: RRR; no murmurs, rubs, or gallops,no edema  Respiratory:  clear to auscultation bilaterally, normal work of breathing GI: soft, nontender, nondistended, + BS MS: no deformity or atrophy  Skin: warm and dry, no rash Neuro:  Strength and sensation are intact Psych: euthymic mood, full affect   EKG:   The ekg ordered today demonstrates NSR, no ST segment changes   Recent Labs: No results found for requested labs within last 8760 hours.   Lipid Panel No results found for: CHOL, TRIG, HDL, CHOLHDL, VLDL, LDLCALC, LDLDIRECT   Other studies Reviewed: Additional studies/ records that were reviewed today with results demonstrating: Normal nuclear stress test in December 2014.   ASSESSMENT AND PLAN:  1. SVT: Symptoms controlled.  Rare episodes while she had a flu like illness.  She has only taken extra metoprolol once due to the stress of moving.  Continue metoprolol. We discussed changing her dose to try and suppress the SVT that causes her episodes. She is not interested at this time.  2. Tobacco abuse: She stopped smoking.  She  [facility] if she was smoking. 3. Essential HTN: Controlled. Continue current medicines. She does not check at home. She only gets it checked at other doctor's offices. 4.  PAD: Carotid artery disease. Followed by Dr. Hart Rochester.  She has ultrasounds there every so often. 5. Blood work checked with Dr. Tenny Crawoss in November 2017.   Current medicines are reviewed at length with the patient today.  The patient concerns regarding her medicines were addressed.  The following changes have been made:  No change  Labs/ tests ordered today include:   Orders Placed This Encounter  Procedures  . EKG 12-Lead    Recommend 150 minutes/week of aerobic exercise Low fat, low carb, high fiber diet recommended  Disposition:   FU in one year   Signed, Lance MussJayadeep Bettye Sitton, MD  02/08/2016 10:25 AM    [facility] [address], [place], [place]  [address] Phone: [phone]; Fax: [phone]

## 2016-02-08 ENCOUNTER — Ambulatory Visit (INDEPENDENT_AMBULATORY_CARE_PROVIDER_SITE_OTHER): Payer: Medicare Other | Admitting: Interventional Cardiology

## 2016-02-08 ENCOUNTER — Encounter: Payer: Self-pay | Admitting: Interventional Cardiology

## 2016-02-08 VITALS — BP 120/60 | HR 72 | Ht 62.0 in | Wt 118.1 lb

## 2016-02-08 DIAGNOSIS — I471 Supraventricular tachycardia: Secondary | ICD-10-CM | POA: Diagnosis not present

## 2016-02-08 DIAGNOSIS — I10 Essential (primary) hypertension: Secondary | ICD-10-CM | POA: Diagnosis not present

## 2016-02-08 DIAGNOSIS — I6523 Occlusion and stenosis of bilateral carotid arteries: Secondary | ICD-10-CM | POA: Diagnosis not present

## 2016-02-08 DIAGNOSIS — I6529 Occlusion and stenosis of unspecified carotid artery: Secondary | ICD-10-CM

## 2016-02-08 NOTE — Patient Instructions (Signed)
**Note De-identified Vicki Hernandez Obfuscation** Medication Instructions:  Same-no changes  Labwork: None  Testing/Procedures: None  Follow-Up: Your physician wants you to follow-up in: 1 year. You will receive a reminder letter in the mail two months in advance. If you don't receive a letter, please call our office to schedule the follow-up appointment.      If you need a refill on your cardiac medications before your next appointment, please call your pharmacy.   

## 2016-03-30 DIAGNOSIS — Z8719 Personal history of other diseases of the digestive system: Secondary | ICD-10-CM | POA: Insufficient documentation

## 2016-03-30 DIAGNOSIS — Z8679 Personal history of other diseases of the circulatory system: Secondary | ICD-10-CM | POA: Insufficient documentation

## 2016-03-30 DIAGNOSIS — M810 Age-related osteoporosis without current pathological fracture: Secondary | ICD-10-CM | POA: Insufficient documentation

## 2016-03-30 NOTE — Progress Notes (Deleted)
Office Visit Note  Patient: Vicki Hernandez             Date of Birth: 26-Mar-1928           MRN: [id number]             PCP: Duane Lope, MD Referring: Daisy Floro, MD Visit Date: 04/06/2016 Occupation: @GUAROCC @    Subjective:  No chief complaint on file.   History of Present Illness: Vicki Hernandez is a 83 y.o. female ***   Activities of Daily Living:  Patient reports morning stiffness for *** {minute/hour:19697}.   Patient {ACTIONS;DENIES/REPORTS:21021675::"Denies"} nocturnal pain.  Difficulty dressing/grooming: {ACTIONS;DENIES/REPORTS:21021675::"Denies"} Difficulty climbing stairs: {ACTIONS;DENIES/REPORTS:21021675::"Denies"} Difficulty getting out of chair: {ACTIONS;DENIES/REPORTS:21021675::"Denies"} Difficulty using hands for taps, buttons, cutlery, and/or writing: {ACTIONS;DENIES/REPORTS:21021675::"Denies"}   No Rheumatology ROS completed.   PMFS History:  Patient Active Problem List   Diagnosis Date Noted  . History of hypertension 03/30/2016  . History of gastroesophageal reflux (GERD) 03/30/2016  . Age-related osteoporosis without current pathological fracture 03/30/2016  . SVT (supraventricular tachycardia) (HCC) 11/29/2012  . Essential hypertension, benign 11/29/2012  . Tobacco use disorder 11/29/2012  . PVD (peripheral vascular disease) (HCC) 11/29/2012  . Aftercare following surgery of the circulatory system, NEC 10/18/2011  . Other symptoms involving cardiovascular system 04/05/2011  . Occlusion and stenosis of carotid artery 03/21/2011    Past Medical History:  Diagnosis Date  . Accessory kidney   . Angina    3 WEEKS  . Arthritis    OSTEO   . Atrial fibrillation (HCC) 2014  . Carotid artery bruit   . Complication of anesthesia    TAKES AWHILE TO WAKE UP   . Dysrhythmia    TACHYCARDIA   . GERD (gastroesophageal reflux disease)   . H/O hiatal hernia   . Heart murmur   . Hyperlipidemia   . Hypertension   . Macular degeneration       Family History  Problem Relation Age of Onset  . Hyperlipidemia Father   . Hypertension Father   . Heart disease Father     Heart Disease before age 90-  Bleeding problems  . Heart attack Father   . Stroke Mother   . Heart disease Mother     After age 47   Past Surgical History:  Procedure Laterality Date  . ABDOMINAL HYSTERECTOMY  1974   TAH w/ BSO  . APPENDECTOMY    . CARDIOVASCULAR STRESS TEST     5 YRS AGO     DR H SMITH  (NOW SEES DR Eldridge Dace)  . CAROTID ENDARTERECTOMY Left 03-25-11   cea  . CATARACT EXTRACTION W/ INTRAOCULAR LENS  IMPLANT, BILATERAL    . ENDARTERECTOMY  03/25/2011   Procedure: ENDARTERECTOMY CAROTID;  Surgeon: Pryor Ochoa, MD;  Location: [facility] OR;  Service: Vascular;  Laterality: Left;  Left Carotid Endarterectomy with dacron patch angioplasty, with resection of redundant common carotid with primary reanastamosis  . EYE SURGERY    . HEMORRHOID SURGERY    . JOINT REPLACEMENT  2012   Left Knee  . KNEE SURGERY    . SPINE SURGERY  2002, 2010   Lumbar disk/ spine surgeries X 2   By Dr. Shon Baton  . TONSILLECTOMY     Social History   Social History Narrative  . No narrative on file     Objective: Vital Signs: There were no vitals taken for this visit.   Physical Exam   Musculoskeletal Exam: ***  CDAI Exam: No CDAI exam completed.  Investigation: Findings:  DEXA 06/12/2013  T score -2.5 left femoral neck      Imaging: No results found.  Speciality Comments: No specialty comments available.    Procedures:  No procedures performed Allergies: Sulfa antibiotics; Codeine; and Cymbalta [duloxetine hcl]   Assessment / Plan:     Visit Diagnoses: Primary osteoarthritis of both hands  Primary osteoarthritis of right knee  Spondylosis of lumbar region without myelopathy or radiculopathy  History of hypertension  History of gastroesophageal reflux (GERD)  Age-related osteoporosis without current pathological fracture - Reclast (october  2016)    Orders: No orders of the defined types were placed in this encounter.  No orders of the defined types were placed in this encounter.   Face-to-face time spent with patient was *** minutes. 50% of time was spent in counseling and coordination of care.  Follow-Up Instructions: No Follow-up on file.   Jerrid Forgette, RT  Note - This record has been created using [employer] software.  Chart creation errors have been sought, but may not always  have been located. Such creation errors do not reflect on  the standard of medical care.

## 2016-04-06 ENCOUNTER — Ambulatory Visit: Payer: Self-pay | Admitting: Rheumatology

## 2016-05-13 ENCOUNTER — Other Ambulatory Visit: Payer: Self-pay | Admitting: Rheumatology

## 2016-05-16 ENCOUNTER — Telehealth: Payer: Self-pay

## 2016-05-16 NOTE — Telephone Encounter (Signed)
Patient needs appt, message sent to schedule Last visit 09/2015 Ok to refill per Dr Corliss Skains

## 2016-05-16 NOTE — Telephone Encounter (Signed)
Received a conformation  from [employer] regarding a prior authorization approval for Voltaren Gel through 2018.   Reference number: [id number] Phone number: [phone]  Spoke to patient who voices understanding and denies any questions about her medication at this time.  Arilla Hice, [place], CPhT   8:55 AM

## 2016-05-29 ENCOUNTER — Encounter (HOSPITAL_COMMUNITY): Payer: Self-pay

## 2016-05-29 ENCOUNTER — Emergency Department (HOSPITAL_COMMUNITY): Payer: Medicare Other

## 2016-05-29 ENCOUNTER — Emergency Department (HOSPITAL_COMMUNITY)
Admission: EM | Admit: 2016-05-29 | Discharge: 2016-05-29 | Disposition: A | Discharge status: Home / Self Care | Payer: Medicare Other | Attending: Emergency Medicine | Admitting: Emergency Medicine

## 2016-05-29 DIAGNOSIS — I10 Essential (primary) hypertension: Secondary | ICD-10-CM | POA: Insufficient documentation

## 2016-05-29 DIAGNOSIS — Z79899 Other long term (current) drug therapy: Secondary | ICD-10-CM | POA: Diagnosis not present

## 2016-05-29 DIAGNOSIS — Z87891 Personal history of nicotine dependence: Secondary | ICD-10-CM | POA: Diagnosis not present

## 2016-05-29 DIAGNOSIS — Z7982 Long term (current) use of aspirin: Secondary | ICD-10-CM | POA: Insufficient documentation

## 2016-05-29 DIAGNOSIS — K644 Residual hemorrhoidal skin tags: Secondary | ICD-10-CM | POA: Diagnosis not present

## 2016-05-29 DIAGNOSIS — K6289 Other specified diseases of anus and rectum: Secondary | ICD-10-CM | POA: Diagnosis present

## 2016-05-29 DIAGNOSIS — K5641 Fecal impaction: Secondary | ICD-10-CM | POA: Insufficient documentation

## 2016-05-29 LAB — CBC WITH DIFFERENTIAL/PLATELET
Basophils Absolute: 0 10*3/uL (ref 0.0–0.1)
Basophils Relative: 0 %
Eosinophils Absolute: 0.1 10*3/uL (ref 0.0–0.7)
Eosinophils Relative: 1 %
HCT: 38.5 % (ref 36.0–46.0)
Hemoglobin: 12.5 g/dL (ref 12.0–15.0)
Lymphocytes Relative: 13 %
Lymphs Abs: 1.4 10*3/uL (ref 0.7–4.0)
MCH: 29.7 pg (ref 26.0–34.0)
MCHC: 32.5 g/dL (ref 30.0–36.0)
MCV: 91.4 fL (ref 78.0–100.0)
Monocytes Absolute: 0.6 10*3/uL (ref 0.1–1.0)
Monocytes Relative: 6 %
Neutro Abs: 8 10*3/uL — ABNORMAL HIGH (ref 1.7–7.7)
Neutrophils Relative %: 80 %
Platelets: 197 10*3/uL (ref 150–400)
RBC: 4.21 MIL/uL (ref 3.87–5.11)
RDW: 15.2 % (ref 11.5–15.5)
WBC: 10 10*3/uL (ref 4.0–10.5)

## 2016-05-29 LAB — COMPREHENSIVE METABOLIC PANEL
ALT: 12 U/L — ABNORMAL LOW (ref 14–54)
AST: 20 U/L (ref 15–41)
Albumin: 3.9 g/dL (ref 3.5–5.0)
Alkaline Phosphatase: 55 U/L (ref 38–126)
Anion gap: 10 (ref 5–15)
BUN: 17 mg/dL (ref 6–20)
CO2: 27 mmol/L (ref 22–32)
Calcium: 9.2 mg/dL (ref 8.9–10.3)
Chloride: 102 mmol/L (ref 101–111)
Creatinine, Ser: 1.11 mg/dL — ABNORMAL HIGH (ref 0.44–1.00)
GFR calc Af Amer: 51 mL/min — ABNORMAL LOW (ref >60)
GFR calc non Af Amer: 44 mL/min — ABNORMAL LOW (ref >60)
Glucose, Bld: 101 mg/dL — ABNORMAL HIGH (ref 65–99)
Potassium: 3.6 mmol/L (ref 3.5–5.1)
Sodium: 139 mmol/L (ref 135–145)
Total Bilirubin: 0.7 mg/dL (ref 0.3–1.2)
Total Protein: 7 g/dL (ref 6.5–8.1)

## 2016-05-29 MED ORDER — LIDOCAINE HCL 2 % EX GEL
1.0000 "application " | Freq: Once | CUTANEOUS | Status: AC
  Administered 2016-05-29: 1
  Filled 2016-05-29: qty 11

## 2016-05-29 MED ORDER — MINERAL OIL RE ENEM
1.0000 | ENEMA | Freq: Once | RECTAL | Status: AC
  Administered 2016-05-29: 1 via RECTAL
  Filled 2016-05-29: qty 1

## 2016-05-29 MED ORDER — ONDANSETRON HCL 4 MG/2ML IJ SOLN
4.0000 mg | Freq: Once | INTRAMUSCULAR | Status: AC
  Administered 2016-05-29: 4 mg via INTRAVENOUS
  Filled 2016-05-29: qty 2

## 2016-05-29 MED ORDER — HYDROCORTISONE ACETATE 25 MG RE SUPP: 25.0000 mg | Freq: Two times a day (BID) | RECTAL | 0 refills | Status: DC

## 2016-05-29 MED ORDER — SORBITOL 70 % SOLN
960.0000 mL | TOPICAL_OIL | Freq: Once | ORAL | Status: DC
  Filled 2016-05-29: qty 240

## 2016-05-29 MED ORDER — MORPHINE SULFATE (PF) 4 MG/ML IV SOLN
4.0000 mg | Freq: Once | INTRAVENOUS | Status: AC
  Administered 2016-05-29: 4 mg via INTRAVENOUS
  Filled 2016-05-29: qty 1

## 2016-05-29 MED ORDER — OXYCODONE-ACETAMINOPHEN 5-325 MG PO TABS: 1.0000 | ORAL_TABLET | Freq: Once | ORAL | Status: DC

## 2016-05-29 NOTE — Discharge Instructions (Signed)
Call your GI doctor tomorrow regarding what bowel regimen you should be on.   See your GI doctor  Continue sitz bath, anusol   Return to ER if you have severe abdominal pain, vomiting, fevers.

## 2016-05-29 NOTE — ED Triage Notes (Signed)
Pt c/o rectal pain r/t constipation x 8 days.  Pain score 10/10.  Pt reports taking OTC and prescribed medications w/o relief.  Hx of fecal impactions.  Denies n/v.

## 2016-05-29 NOTE — ED Notes (Signed)
PT DISCHARGED. INSTRUCTIONS AND PRESCRIPTION GIVEN. AAOX4. PT IN NO APPARENT DISTRESS WITH MILD PAIN. THE OPPORTUNITY TO ASK QUESTIONS WAS PROVIDED. 

## 2016-05-29 NOTE — ED Notes (Signed)
Pt had a small BM while being taken to room in a wheelchair.  Pt's pants and underwear were thrown away by her daughter.

## 2016-05-29 NOTE — ED Notes (Signed)
PT HAD A LARGE STOOL. DR. Silverio LayYAO MADE AWARE.

## 2016-05-29 NOTE — ED Provider Notes (Signed)
[facility]-EMERGENCY DEPT Provider Note   CSN: [id number] Arrival date & time: 05/29/16  1044     History   Chief Complaint Chief Complaint  Patient presents with  . Rectal Pain  . Constipation    HPI Vicki Hernandez is a 83 y.o. female history of atrial fibrillation, chronic constipation from IBS, here presenting with worst constipation, abdominal pain. Patient states that she is chronically constipated and uses stool softeners and enema at baseline. States that her last bowel movement was about a week ago and since then she has only very little bowel movements. She has progressive abdominal distention and pain but denies any vomiting or fevers. Denies any history of small bowel obstruction. She states that she follows up with Dr. Loreta Ave from GI. She required disimpaction about 10 years ago but none more recently. Denies any previous history of small bowel obstructions.    The history is provided by the patient.    Past Medical History:  Diagnosis Date  . Accessory kidney   . Angina    3 WEEKS  . Arthritis    OSTEO   . Atrial fibrillation (HCC) 2014  . Carotid artery bruit   . Complication of anesthesia    TAKES AWHILE TO WAKE UP   . Dysrhythmia    TACHYCARDIA   . GERD (gastroesophageal reflux disease)   . H/O hiatal hernia   . Heart murmur   . Hyperlipidemia   . Hypertension   . Macular degeneration     Patient Active Problem List   Diagnosis Date Noted  . History of hypertension 03/30/2016  . History of gastroesophageal reflux (GERD) 03/30/2016  . Age-related osteoporosis without current pathological fracture 03/30/2016  . SVT (supraventricular tachycardia) (HCC) 11/29/2012  . Essential hypertension, benign 11/29/2012  . Tobacco use disorder 11/29/2012  . PVD (peripheral vascular disease) (HCC) 11/29/2012  . Aftercare following surgery of the circulatory system, NEC 10/18/2011  . Other symptoms involving cardiovascular system 04/05/2011  . Occlusion and stenosis of  carotid artery 03/21/2011    Past Surgical History:  Procedure Laterality Date  . ABDOMINAL HYSTERECTOMY  1974   TAH w/ BSO  . APPENDECTOMY    . CARDIOVASCULAR STRESS TEST     5 YRS AGO     DR H SMITH  (NOW SEES DR Eldridge Dace)  . CAROTID ENDARTERECTOMY Left 03-25-11   cea  . CATARACT EXTRACTION W/ INTRAOCULAR LENS  IMPLANT, BILATERAL    . ENDARTERECTOMY  03/25/2011   Procedure: ENDARTERECTOMY CAROTID;  Surgeon: Pryor Ochoa, MD;  Location: [facility] OR;  Service: Vascular;  Laterality: Left;  Left Carotid Endarterectomy with dacron patch angioplasty, with resection of redundant common carotid with primary reanastamosis  . EYE SURGERY    . HEMORRHOID SURGERY    . JOINT REPLACEMENT  2012   Left Knee  . KNEE SURGERY    . SPINE SURGERY  2002, 2010   Lumbar disk/ spine surgeries X 2   By Dr. Shon Baton  . TONSILLECTOMY      OB History    No data available       Home Medications    Prior to Admission medications   Medication Sig Start Date End Date Taking? Authorizing Provider  aspirin 81 MG tablet Take 81 mg by mouth every morning.    Yes [provider]  diclofenac sodium (VOLTAREN) 1 % GEL APPLY 3 GRAMS TO 3 LARGE JOINTS UP TO 3 TIMES A DAY AS NEEDED 05/16/16  Yes Deveshwar, Janalyn Rouse, MD  esomeprazole (NEXIUM)  20 MG capsule Take 20 mg by mouth daily at 12 noon.   Yes [provider]  LINZESS 290 MCG CAPS capsule Take 290 mcg by mouth daily. 05/24/16  Yes [provider]  losartan-hydrochlorothiazide (HYZAAR) 50-12.5 MG per tablet Take 1 tablet by mouth every morning.    Yes [provider]  LYRICA 75 MG capsule Take 75 mg by mouth daily.  06/12/14  Yes [provider]  metoprolol tartrate (LOPRESSOR) 25 MG tablet TAKE 1/2 TABLET TWICE DAILY, MAY TAKE AN EXTRA 1 TABLET AS NEEDED FOR PALPITATIONS 04/04/14  Yes Corky Crafts, MD  Multiple Vitamins-Minerals (EYE-VITES PO) Take 1 tablet by mouth daily.    Yes [provider]  NITROSTAT 0.4 MG  SL tablet PLACE 1 TABLET UNDER THE TONGUE EVERY 5 MINS AS NEEDED FOR CHEST PAIN 10/14/14  Yes Corky Crafts, MD  Polyethyl Glycol-Propyl Glycol (SYSTANE OP) Place 1-2 drops into both eyes daily as needed. For dry eyes   Yes [provider]  polyethylene glycol (MIRALAX / GLYCOLAX) packet Take 17 g by mouth daily as needed for mild constipation or moderate constipation.   Yes [provider]  senna (SENOKOT) 8.6 MG tablet Take 1 tablet by mouth every morning.   Yes [provider]  simvastatin (ZOCOR) 40 MG tablet Take 40 mg by mouth every evening.   Yes [provider]  traMADol (ULTRAM) 50 MG tablet Take 50 mg by mouth every 6 (six) hours as needed. For pain   Yes [provider]  VITAMIN D, ERGOCALCIFEROL, PO Take 1 capsule by mouth every morning.    Yes [provider]    Family History Family History  Problem Relation Age of Onset  . Hyperlipidemia Father   . Hypertension Father   . Heart disease Father        Heart Disease before age 66-  Bleeding problems  . Heart attack Father   . Stroke Mother   . Heart disease Mother        After age 29    Social History Social History  Substance Use Topics  . Smoking status: Former Smoker    Packs/day: 0.50    Types: Cigarettes    Quit date: 12/28/2015  . Smokeless tobacco: Never Used  . Alcohol use Yes     Comment: social     Allergies   Sulfa antibiotics; Codeine; and Cymbalta [duloxetine hcl]   Review of Systems Review of Systems  Gastrointestinal: Positive for abdominal pain and constipation.  All other systems reviewed and are negative.    Physical Exam Updated Vital Signs BP 115/78 (BP Location: Left Arm)   Pulse 71   Temp 98.1 F (36.7 C) (Oral)   Resp 16   Ht 5\' 2"  (1.575 m)   Wt 118 lb (53.5 kg)   SpO2 99%   BMI 21.58 kg/m   Physical Exam  Constitutional:  Uncomfortable   HENT:  Head: Normocephalic.  Eyes: EOM are normal. Pupils are equal,  round, and reactive to light.  Neck: Normal range of motion. Neck supple.  Cardiovascular: Normal rate, regular rhythm and normal heart sounds.   Pulmonary/Chest: Effort normal and breath sounds normal. No respiratory distress. She has no wheezes.  Abdominal:  Slightly distended, mild LLQ tenderness   Genitourinary:  Genitourinary Comments: Rectal- small hemorrhoids, no active bleeding. + stool impaction   Musculoskeletal: Normal range of motion.  Neurological: She is alert.  Skin: Skin is warm.  Psychiatric: She has a  normal mood and affect.  Nursing note and vitals reviewed.    ED Treatments / Results  Labs (all labs ordered are listed, but only abnormal results are displayed) Labs Reviewed  CBC WITH DIFFERENTIAL/PLATELET  COMPREHENSIVE METABOLIC PANEL    EKG  EKG Interpretation None       Radiology Dg Abd Acute W/chest  Result Date: 05/29/2016 CLINICAL DATA:  Constipation for 8 days. EXAM: DG ABDOMEN ACUTE W/ 1V CHEST COMPARISON:  March 03, 2015 chest x-ray FINDINGS: The heart, hila, mediastinum, lungs, and pleura demonstrate no acute abnormalities. No free air identified. No portal venous gas or pneumatosis. No bowel obstruction is seen. Fecal loading in the rectum. IMPRESSION: Fecal loading in the rectum. No bowel obstruction or acute abnormality. Electronically Signed   By: Gerome Samavid  Williams III M.D   On: 05/29/2016 12:27    Procedures Fecal disimpaction Date/Time: 05/29/2016 12:57 PM Performed by: Charlynne PanderYAO, Liliana Brentlinger HSIENTA Authorized by: Charlynne PanderYAO, [place] Gipe HSIENTA  Consent: Verbal consent obtained. Risks and benefits: risks, benefits and alternatives were discussed Consent given by: patient Patient understanding: patient states understanding of the procedure being performed Patient consent: the patient's understanding of the procedure matches consent given Relevant documents: relevant documents present and verified Patient identity confirmed: verbally with  patient Preparation: Patient was prepped and draped in the usual sterile fashion. Local anesthesia used: yes  Anesthesia: Local anesthesia used: yes Local Anesthetic: topical anesthetic  Sedation: Patient sedated: no Patient tolerance: Patient tolerated the procedure well with no immediate complications    (including critical care time)  Medications Ordered in ED Medications  mineral oil enema 1 enema (not administered)  lidocaine (XYLOCAINE) 2 % jelly 1 application (1 application Other Given by Other 05/29/16 1223)  morphine 4 MG/ML injection 4 mg (4 mg Intravenous Given 05/29/16 1220)  ondansetron (ZOFRAN) injection 4 mg (4 mg Intravenous Given 05/29/16 1220)     Initial Impression / Assessment and Plan / ED Course  I have reviewed the triage vital signs and the nursing notes.  Pertinent labs & imaging results that were available during my care of the patient were reviewed by me and considered in my medical decision making (see chart for details).     Vicki Nightingalestelle B Dundon is a 83 y.o. female here with abdominal pain, stool impaction. Will get xray to r/o SBO (low suspicion). Will perform disimpaction and try enema.   12:58 PM xrays showed constipation. Disimpacted successfully. Will give enema.   3:30 PM Had a large bowel movement after disimpaction and mineral oil enema. Felt better now. Has follow up with Dr. Loreta AveMann. Told her to call office tomorrow to talk to Dr. Loreta AveMann regarding bowel regimen. Has enemas, stool softeners at home.    Final Clinical Impressions(s) / ED Diagnoses   Final diagnoses:  None    New Prescriptions New Prescriptions   No medications on file     Charlynne PanderYao, Syona Wroblewski Hsienta, MD 05/29/16 1531

## 2016-05-29 NOTE — ED Notes (Signed)
Bed: WA03 Expected date:  Expected time:  Means of arrival:  Comments: 

## 2016-06-07 DIAGNOSIS — Z981 Arthrodesis status: Secondary | ICD-10-CM | POA: Insufficient documentation

## 2016-06-07 DIAGNOSIS — M19041 Primary osteoarthritis, right hand: Secondary | ICD-10-CM | POA: Insufficient documentation

## 2016-06-07 DIAGNOSIS — M5136 Other intervertebral disc degeneration, lumbar region: Secondary | ICD-10-CM | POA: Insufficient documentation

## 2016-06-07 DIAGNOSIS — Z8669 Personal history of other diseases of the nervous system and sense organs: Secondary | ICD-10-CM | POA: Insufficient documentation

## 2016-06-07 DIAGNOSIS — M170 Bilateral primary osteoarthritis of knee: Secondary | ICD-10-CM | POA: Insufficient documentation

## 2016-06-07 DIAGNOSIS — Z96652 Presence of left artificial knee joint: Secondary | ICD-10-CM | POA: Insufficient documentation

## 2016-06-07 DIAGNOSIS — M19042 Primary osteoarthritis, left hand: Secondary | ICD-10-CM | POA: Insufficient documentation

## 2016-06-07 DIAGNOSIS — M5030 Other cervical disc degeneration, unspecified cervical region: Secondary | ICD-10-CM | POA: Insufficient documentation

## 2016-06-07 NOTE — Progress Notes (Signed)
Office Visit Note  Patient: Vicki Hernandez             Date of Birth: 17-Oct-1928           MRN: [id number]             PCP: Daisy Floro, MD Referring: Daisy Floro, MD Visit Date: 06/20/2016 Occupation: @GUAROCC @    Subjective:  Pain hands and knees.   History of Present Illness: Vicki Hernandez is a 83 y.o. female with history of osteoarthritis. She states she's been having increased pain in her hands. She's been having nocturnal pain in her hands and knee joints. She is not interested assisted living Center where she's been doing more exercises with physical therapy. She reports some swelling in her hands.  Activities of Daily Living:  Patient reports morning stiffness for 30 minutes.   Patient Reports nocturnal pain.  Difficulty dressing/grooming: Denies Difficulty climbing stairs: Denies Difficulty getting out of chair: Denies Difficulty using hands for taps, buttons, cutlery, and/or writing: Reports   Review of Systems  Constitutional: Negative for fatigue, night sweats, weight gain, weight loss and weakness.  HENT: Negative for mouth sores, trouble swallowing, trouble swallowing, mouth dryness and nose dryness.   Eyes: Positive for dryness. Negative for pain, redness and visual disturbance.  Respiratory: Negative for cough, shortness of breath and difficulty breathing.   Cardiovascular: Negative for chest pain, palpitations, hypertension, irregular heartbeat and swelling in legs/feet.  Gastrointestinal: Negative for blood in stool, constipation and diarrhea.  Endocrine: Negative for increased urination.  Genitourinary: Negative for vaginal dryness.  Musculoskeletal: Positive for arthralgias, joint pain and morning stiffness. Negative for joint swelling, myalgias, muscle weakness, muscle tenderness and myalgias.  Skin: Negative for color change, rash, hair loss, skin tightness, ulcers and sensitivity to sunlight.  Allergic/Immunologic: Negative for  susceptible to infections.  Neurological: Negative for dizziness, memory loss and night sweats.  Hematological: Negative for swollen glands.  Psychiatric/Behavioral: Positive for depressed mood. Negative for sleep disturbance. The patient is not nervous/anxious.     PMFS History:  Patient Active Problem List   Diagnosis Date Noted  . Primary osteoarthritis of both hands 06/07/2016  . Primary osteoarthritis of both knees 06/07/2016  . DDD (degenerative disc disease), cervical 06/07/2016  . DDD (degenerative disc disease), lumbar 06/07/2016  . History of macular degeneration 06/07/2016  . History of total left knee replacement (TKR) 06/07/2016  . S/P cervical spinal fusion 06/07/2016  . History of hypertension 03/30/2016  . History of gastroesophageal reflux (GERD) 03/30/2016  . Age-related osteoporosis without current pathological fracture 03/30/2016  . SVT (supraventricular tachycardia) (HCC) 11/29/2012  . Essential hypertension, benign 11/29/2012  . Tobacco use disorder 11/29/2012  . PVD (peripheral vascular disease) (HCC) 11/29/2012  . Aftercare following surgery of the circulatory system, NEC 10/18/2011  . Other symptoms involving cardiovascular system 04/05/2011  . Occlusion and stenosis of carotid artery 03/21/2011    Past Medical History:  Diagnosis Date  . Accessory kidney   . Angina    3 WEEKS  . Arthritis    OSTEO   . Atrial fibrillation (HCC) 2014  . Carotid artery bruit   . Complication of anesthesia    TAKES AWHILE TO WAKE UP   . Dysrhythmia    TACHYCARDIA   . GERD (gastroesophageal reflux disease)   . H/O hiatal hernia   . Heart murmur   . Hyperlipidemia   . Hypertension   . Macular degeneration     Family History  Problem  Relation Age of Onset  . Hyperlipidemia Father   . Hypertension Father   . Heart disease Father        Heart Disease before age 81-  Bleeding problems  . Heart attack Father   . Stroke Mother   . Heart disease Mother        After  age 81   Past Surgical History:  Procedure Laterality Date  . ABDOMINAL HYSTERECTOMY  1974   TAH w/ BSO  . APPENDECTOMY    . CARDIOVASCULAR STRESS TEST     5 YRS AGO     DR H SMITH  (NOW SEES DR Eldridge DaceVARANASI)  . CAROTID ENDARTERECTOMY Left 03-25-11   cea  . CATARACT EXTRACTION W/ INTRAOCULAR LENS  IMPLANT, BILATERAL    . ENDARTERECTOMY  03/25/2011   Procedure: ENDARTERECTOMY CAROTID;  Surgeon: Pryor OchoaJames D Lawson, MD;  [facility];  Service: Vascular;  Laterality: Left;  Left Carotid Endarterectomy with dacron patch angioplasty, with resection of redundant common carotid with primary reanastamosis  . EYE SURGERY    . HEMORRHOID SURGERY    . JOINT REPLACEMENT  2012   Left Knee  . KNEE SURGERY    . SPINE SURGERY  2002, 2010   Lumbar disk/ spine surgeries X 2   By Dr. Shon BatonBrooks  . TONSILLECTOMY     Social History   Social History Narrative  . No narrative on file     Objective: Vital Signs: BP 102/64   Pulse 62   Resp 14   Wt 126 lb (57.2 kg)   BMI 23.05 kg/m    Physical Exam  Constitutional: She is oriented to person, place, and time. She appears well-developed and well-nourished.  HENT:  Head: Normocephalic and atraumatic.  Eyes: Conjunctivae and EOM are normal.  Neck: Normal range of motion.  Cardiovascular: Normal rate, regular rhythm, normal heart sounds and intact distal pulses.   Pulmonary/Chest: Effort normal and breath sounds normal.  Abdominal: Soft. Bowel sounds are normal.  Lymphadenopathy:    She has no cervical adenopathy.  Neurological: She is alert and oriented to person, place, and time.  Skin: Skin is warm and dry. Capillary refill takes less than 2 seconds.  Psychiatric: She has a normal mood and affect. Her behavior is normal.  Nursing note and vitals reviewed.    Musculoskeletal Exam: C-spine good range of motion. She is thoracic kyphosis. Lumbar spine good range of motion. Shoulder joints elbow joints are good range of motion. She has DIP PIP thickening  and subluxation at several of her DIP joints. She has discomfort over right CMC joint today. Hip joints are good range of motion. Her left knees replaced. Right knee joint has discomfort with range of motion and crepitus. She has some PIP/DIP thickening in her feet.  CDAI Exam: No CDAI exam completed.    Investigation: Findings:  Osteoporosis.  DEXA 06/12/2013 5 shows a T score of negative 2.5 of the left femoral neck.    Imaging: Dg Abd Acute W/chest  Result Date: 05/29/2016 CLINICAL DATA:  Constipation for 8 days. EXAM: DG ABDOMEN ACUTE W/ 1V CHEST COMPARISON:  March 03, 2015 chest x-ray FINDINGS: The heart, hila, mediastinum, lungs, and pleura demonstrate no acute abnormalities. No free air identified. No portal venous gas or pneumatosis. No bowel obstruction is seen. Fecal loading in the rectum. IMPRESSION: Fecal loading in the rectum. No bowel obstruction or acute abnormality. Electronically Signed   By: Gerome Samavid  Williams III M.D   On: 05/29/2016 12:27  Speciality Comments: No specialty comments available.    Procedures:  Large Joint Inj Date/Time: 06/20/2016 11:56 AM Performed by: Pollyann Savoy Authorized by: Pollyann Savoy   Consent Given by:  Patient Site marked: the procedure site was marked   Timeout: prior to procedure the correct patient, procedure, and site was verified   Indications:  Pain and joint swelling Location:  Knee Site:  R knee Prep: patient was prepped and draped in usual sterile fashion   Needle Size:  27 G Needle Length:  1.5 inches Approach:  Medial Ultrasound Guidance: No   Fluoroscopic Guidance: No   Arthrogram: No   Medications:  1.5 mL lidocaine 1 %; 40 mg triamcinolone acetonide 40 MG/ML Aspiration Attempted: Yes   Aspirate amount (mL):  0 Patient tolerance:  Patient tolerated the procedure well with no immediate complications Small Joint Inj Date/Time: 06/20/2016 12:12 PM Performed by: Pollyann Savoy Authorized by: Pollyann Savoy   Consent Given by:  Patient Site marked: the procedure site was marked   Timeout: prior to procedure the correct patient, procedure, and site was verified   Indications:  Pain Location:  Thumb Site:  R thumb CMC Prep: patient was prepped and draped in usual sterile fashion   Needle Size:  27 G Spinal Needle: No   Approach:  Radial Ultrasound Guided: No   Fluoroscopic Guidance: No   Medications:  0.5 mL lidocaine 1 %; 10 mg triamcinolone acetonide 40 MG/ML; 0.3 mL lidocaine 1 % Aspiration Attempted: Yes   Aspirate amount (mL):  0 Patient tolerance:  Patient tolerated the procedure well with no immediate complications   Allergies: Sulfa antibiotics; Codeine; and Cymbalta [duloxetine hcl]   Assessment / Plan:     Visit Diagnoses: Primary osteoarthritis of both hands. She continues to have some discomfort in her hands from underlying osteoarthritis. He's been having  more discomfort in her right CMC. The joint was injected as described above. She tolerated the procedure well.  Primary osteoarthritis of both knees: She's been having increased pain and discomfort in her right knee joint. She's been going to physical therapy which is causing increase the stress on her joints. Different treatment options and their side effects were discussed after informed consent was obtained right knee joint was prepped in sterile fashion the procedures described above.  History of total left knee replacement (TKR) - Dr Lequita Halt: Doing well  DDD (degenerative disc disease), cervical: Some stiffness  S/P cervical spinal fusion - Dr Otelia Sergeant  DDD (degenerative disc disease), lumbar: Chronic pain  Age-related osteoporosis without current pathological fracture - 06/12/2013 T -2.5 lrft femoral, BMD 0.685. She has had intolerance to oral bisphosphonates in the past. She recalls having IV Reclast September 2016 only once. We will schedule repeat bone density to continue with Reclast. Use of calcium intake  and vitamin D was discussed.  History of gastroesophageal reflux (GERD)  History of hypertension  Tobacco use disorder  History of peripheral vascular disease  History of supraventricular tachycardia  History of macular degeneration    Orders: Orders Placed This Encounter  Procedures  . Large Joint Injection/Arthrocentesis   No orders of the defined types were placed in this encounter.   Face-to-face time spent with patient was 30 minutes. 50% of time was spent in counseling and coordination of care.  Follow-Up Instructions: Return for Osteoarthritis, Osteoporosis.   Pollyann Savoy, MD  Note - This record has been created using Animal nutritionist.  Chart creation errors have been sought, but may not always  have  been located. Such creation errors do not reflect on  the standard of medical care.

## 2016-06-20 ENCOUNTER — Ambulatory Visit (INDEPENDENT_AMBULATORY_CARE_PROVIDER_SITE_OTHER): Payer: Medicare Other | Admitting: Rheumatology

## 2016-06-20 ENCOUNTER — Encounter: Payer: Self-pay | Admitting: Rheumatology

## 2016-06-20 VITALS — BP 102/64 | HR 62 | Resp 14 | Wt 126.0 lb

## 2016-06-20 DIAGNOSIS — M170 Bilateral primary osteoarthritis of knee: Secondary | ICD-10-CM

## 2016-06-20 DIAGNOSIS — Z8669 Personal history of other diseases of the nervous system and sense organs: Secondary | ICD-10-CM | POA: Diagnosis not present

## 2016-06-20 DIAGNOSIS — M810 Age-related osteoporosis without current pathological fracture: Secondary | ICD-10-CM

## 2016-06-20 DIAGNOSIS — M19041 Primary osteoarthritis, right hand: Secondary | ICD-10-CM | POA: Diagnosis not present

## 2016-06-20 DIAGNOSIS — M5030 Other cervical disc degeneration, unspecified cervical region: Secondary | ICD-10-CM | POA: Diagnosis not present

## 2016-06-20 DIAGNOSIS — Z8719 Personal history of other diseases of the digestive system: Secondary | ICD-10-CM | POA: Diagnosis not present

## 2016-06-20 DIAGNOSIS — M51369 Other intervertebral disc degeneration, lumbar region without mention of lumbar back pain or lower extremity pain: Secondary | ICD-10-CM

## 2016-06-20 DIAGNOSIS — F17200 Nicotine dependence, unspecified, uncomplicated: Secondary | ICD-10-CM | POA: Diagnosis not present

## 2016-06-20 DIAGNOSIS — Z96652 Presence of left artificial knee joint: Secondary | ICD-10-CM | POA: Diagnosis not present

## 2016-06-20 DIAGNOSIS — Z981 Arthrodesis status: Secondary | ICD-10-CM

## 2016-06-20 DIAGNOSIS — M19042 Primary osteoarthritis, left hand: Secondary | ICD-10-CM

## 2016-06-20 DIAGNOSIS — M1711 Unilateral primary osteoarthritis, right knee: Secondary | ICD-10-CM

## 2016-06-20 DIAGNOSIS — Z8679 Personal history of other diseases of the circulatory system: Secondary | ICD-10-CM

## 2016-06-20 DIAGNOSIS — M5136 Other intervertebral disc degeneration, lumbar region: Secondary | ICD-10-CM | POA: Diagnosis not present

## 2016-06-20 MED ORDER — TRIAMCINOLONE ACETONIDE 40 MG/ML IJ SUSP
40.0000 mg | INTRAMUSCULAR | Status: AC | PRN
  Administered 2016-06-20: 40 mg via INTRA_ARTICULAR

## 2016-06-20 MED ORDER — LIDOCAINE HCL 1 % IJ SOLN
0.3000 mL | INTRAMUSCULAR | Status: AC | PRN
  Administered 2016-06-20: .3 mL

## 2016-06-20 MED ORDER — TRIAMCINOLONE ACETONIDE 40 MG/ML IJ SUSP
10.0000 mg | INTRAMUSCULAR | Status: AC | PRN
  Administered 2016-06-20: 10 mg via INTRA_ARTICULAR

## 2016-06-20 MED ORDER — LIDOCAINE HCL 1 % IJ SOLN
0.5000 mL | INTRAMUSCULAR | Status: AC | PRN
  Administered 2016-06-20: .5 mL

## 2016-06-20 MED ORDER — LIDOCAINE HCL 1 % IJ SOLN
1.5000 mL | INTRAMUSCULAR | Status: AC | PRN
  Administered 2016-06-20: 1.5 mL

## 2016-06-24 ENCOUNTER — Telehealth: Payer: Self-pay | Admitting: Rheumatology

## 2016-06-24 NOTE — Telephone Encounter (Signed)
Patient called wanting to know if we received her bone scan results via fax yesterday.  CB#[phone].  Thank you.

## 2016-06-28 NOTE — Telephone Encounter (Signed)
Patient advised we have not received them yet. Advised we will be on the lookout for them and will call once received and reviewed.

## 2016-06-29 ENCOUNTER — Encounter: Payer: Self-pay | Admitting: Family

## 2016-07-04 ENCOUNTER — Other Ambulatory Visit: Payer: Self-pay

## 2016-07-04 DIAGNOSIS — Z9889 Other specified postprocedural states: Secondary | ICD-10-CM

## 2016-07-04 DIAGNOSIS — I6529 Occlusion and stenosis of unspecified carotid artery: Secondary | ICD-10-CM

## 2016-07-12 ENCOUNTER — Ambulatory Visit (HOSPITAL_COMMUNITY)
Admission: RE | Admit: 2016-07-12 | Discharge: 2016-07-12 | Disposition: A | Discharge status: Home / Self Care | Payer: Medicare Other | Source: Ambulatory Visit | Attending: Vascular Surgery | Admitting: Vascular Surgery

## 2016-07-12 ENCOUNTER — Encounter: Payer: Self-pay | Admitting: Family

## 2016-07-12 ENCOUNTER — Ambulatory Visit (INDEPENDENT_AMBULATORY_CARE_PROVIDER_SITE_OTHER): Payer: Medicare Other | Admitting: Family

## 2016-07-12 VITALS — BP 108/52 | HR 59 | Temp 97.1°F | Resp 18 | Ht 62.0 in | Wt 120.0 lb

## 2016-07-12 DIAGNOSIS — Z87891 Personal history of nicotine dependence: Secondary | ICD-10-CM

## 2016-07-12 DIAGNOSIS — Z9889 Other specified postprocedural states: Secondary | ICD-10-CM | POA: Insufficient documentation

## 2016-07-12 DIAGNOSIS — I6529 Occlusion and stenosis of unspecified carotid artery: Secondary | ICD-10-CM | POA: Insufficient documentation

## 2016-07-12 DIAGNOSIS — I6522 Occlusion and stenosis of left carotid artery: Secondary | ICD-10-CM

## 2016-07-12 LAB — VAS US CAROTID
LEFT ECA DIAS: -7 cm/s
LEFT VERTEBRAL DIAS: 8 cm/s
Left CCA dist dias: -12 cm/s
Left CCA dist sys: -64 cm/s
Left CCA prox dias: 9 cm/s
Left CCA prox sys: 75 cm/s
Left ICA dist dias: -13 cm/s
Left ICA dist sys: -47 cm/s
Left ICA prox dias: -19 cm/s
Left ICA prox sys: -72 cm/s
RIGHT CCA MID DIAS: 11 cm/s
RIGHT ECA DIAS: -11 cm/s
RIGHT VERTEBRAL DIAS: 12 cm/s
Right CCA prox dias: 11 cm/s
Right CCA prox sys: 63 cm/s
Right cca dist sys: -68 cm/s

## 2016-07-12 NOTE — Progress Notes (Signed)
Chief Complaint: Follow up Extracranial Carotid Artery Stenosis   History of Present Illness  Vicki Hernandez is a 83 y.o. female patient of Dr. Hart RochesterLawson who is s/p left carotid endarterectomy performed in March of 2013.  She returns today for follow up.  She denies any history of stroke or TIA, specifically she denies a history of amaurosis fugax or monocular blindness, unilateral facial drooping, hemiplegia/hemiparesis, or receptive or expressive aphasia.  She denies any pain, tingling, numbness or weakness in arms or hands.  She denies claudication symptoms with walking.  She stays active, exercises in a class twice/week.   Pt Diabetic: No Pt smoker: former smoker, quit 12-28-15,  started at age 83 yrs)  Pt meds include: Statin : Yes ASA: Yes Other anticoagulants/antiplatelets: no    Past Medical History:  Diagnosis Date  . Accessory kidney   . Angina    3 WEEKS  . Arthritis    OSTEO   . Atrial fibrillation (HCC) 2014  . Carotid artery bruit   . Complication of anesthesia    TAKES AWHILE TO WAKE UP   . Dysrhythmia    TACHYCARDIA   . GERD (gastroesophageal reflux disease)   . H/O hiatal hernia   . Heart murmur   . Hyperlipidemia   . Hypertension   . Macular degeneration     Social History Social History  Substance Use Topics  . Smoking status: Former Smoker    Packs/day: 0.50    Types: Cigarettes    Quit date: 12/28/2015  . Smokeless tobacco: Never Used  . Alcohol use Yes     Comment: social    Family History Family History  Problem Relation Age of Onset  . Hyperlipidemia Father   . Hypertension Father   . Heart disease Father        Heart Disease before age 81-  Bleeding problems  . Heart attack Father   . Stroke Mother   . Heart disease Mother        After age 81    Surgical History Past Surgical History:  Procedure Laterality Date  . ABDOMINAL HYSTERECTOMY  1974   TAH w/ BSO  . APPENDECTOMY    . CARDIOVASCULAR STRESS TEST      5 YRS AGO     DR H SMITH  (NOW SEES DR Eldridge DaceVARANASI)  . CAROTID ENDARTERECTOMY Left 03-25-11   cea  . CATARACT EXTRACTION W/ INTRAOCULAR LENS  IMPLANT, BILATERAL    . ENDARTERECTOMY  03/25/2011   Procedure: ENDARTERECTOMY CAROTID;  Surgeon: Pryor OchoaJames D Lawson, MD;  [facility];  Service: Vascular;  Laterality: Left;  Left Carotid Endarterectomy with dacron patch angioplasty, with resection of redundant common carotid with primary reanastamosis  . EYE SURGERY    . HEMORRHOID SURGERY    . JOINT REPLACEMENT  2012   Left Knee  . KNEE SURGERY    . SPINE SURGERY  2002, 2010   Lumbar disk/ spine surgeries X 2   By Dr. Shon BatonBrooks  . TONSILLECTOMY      Allergies  Allergen Reactions  . Sulfa Antibiotics Itching  . Codeine Nausea Only  . Cymbalta [Duloxetine Hcl]     Current Outpatient Prescriptions  Medication Sig Dispense Refill  . aspirin 81 MG tablet Take 81 mg by mouth every morning.     . diclofenac sodium (VOLTAREN) 1 % GEL APPLY 3 GRAMS TO 3 LARGE JOINTS UP TO 3 TIMES A DAY AS NEEDED 300 g 0  . esomeprazole (NEXIUM) 20 MG capsule  Take 20 mg by mouth daily at 12 noon.    . hydrocortisone (ANUSOL-HC) 25 MG suppository Place 1 suppository (25 mg total) rectally 2 (two) times daily. 12 suppository 0  . LINZESS 290 MCG CAPS capsule Take 290 mcg by mouth daily.  12  . losartan-hydrochlorothiazide (HYZAAR) 50-12.5 MG per tablet Take 1 tablet by mouth every morning.     Marland Kitchen LYRICA 50 MG capsule Take 50 mg by mouth daily.  1  . metoprolol tartrate (LOPRESSOR) 25 MG tablet TAKE 1/2 TABLET TWICE DAILY, MAY TAKE AN EXTRA 1 TABLET AS NEEDED FOR PALPITATIONS 30 tablet 4  . Multiple Vitamins-Minerals (EYE-VITES PO) Take 1 tablet by mouth daily.     Marland Kitchen NITROSTAT 0.4 MG SL tablet PLACE 1 TABLET UNDER THE TONGUE EVERY 5 MINS AS NEEDED FOR CHEST PAIN 25 tablet 1  . Polyethyl Glycol-Propyl Glycol (SYSTANE OP) Place 1-2 drops into both eyes daily as needed. For dry eyes    . polyethylene glycol (MIRALAX / GLYCOLAX)  packet Take 17 g by mouth daily as needed for mild constipation or moderate constipation.    . simvastatin (ZOCOR) 40 MG tablet Take 40 mg by mouth every evening.    . traMADol (ULTRAM) 50 MG tablet Take 50 mg by mouth every 6 (six) hours as needed. For pain    . VITAMIN D, ERGOCALCIFEROL, PO Take 1 capsule by mouth every morning.      No current facility-administered medications for this visit.     Review of Systems : See HPI for pertinent positives and negatives.  Physical Examination  Vitals:   07/12/16 1404 07/12/16 1406  BP: 103/64 (!) 108/52  Pulse: (!) 59   Resp: 18   Temp: 97.1 F (36.2 C)   TempSrc: Oral   SpO2: 99%   Weight: 120 lb (54.4 kg)   Height: 5\' 2"  (1.575 m)    Body mass index is 21.95 kg/m.  General: WDWN female in NAD GAIT: normal Eyes: PERRLA Pulmonary: Respirations are non-labored, diminished air movement in all lung fields, occasional moist cough. Cardiac: regular rhythm, slightly bradycardic (on a beta blocker), no detected murmur.  VASCULAR EXAM Carotid Bruits Left Right   Negative Negative   Abdominal aortic pulse is not palpable. Radial pulses are 2+ palpable and equal.      LE Pulses LEFT RIGHT   POPLITEAL not palpable  not palpable   POSTERIOR TIBIAL not palpable  not palpable    DORSALIS PEDIS  ANTERIOR TIBIAL  palpable  palpable     Gastrointestinal: soft, nontender, BS WNL, no r/g,no palpable masses.  Musculoskeletal: No muscle atrophy/wasting. M/S 5/5 throughout except 4/5 in left LE, Extremities without ischemic changes. Moderate kyphosis.   Neurologic: A&O X 3; appropriate affect,speech is normal, CN 2-12 intact, Pain and light touch intact in extremities, Motor exam as listed above.     Assessment: Vicki Hernandez is a 83  y.o. female who is s/p left carotid endarterectomy performed in March of 2013. Pt denies any history of stroke or TIA.  Fortunately she does not have DM and quit smoking in December 2017.   DATA Carotid Duplex (07/12/16): Right ICA: 1-39% stenosis. Left ICA: CEA site with no restenosis. Bilateral vertebral artery flow is antegrade.  Bilateral subclavian artery waveforms are normal.  No significant change compared to the last exam on 07-06-15.   Plan: Follow-up in 18 months with Carotid Duplex scan.     I discussed in depth with the patient the nature of atherosclerosis,  and emphasized the importance of maximal medical management including strict control of blood pressure, blood glucose, and lipid levels, obtaining regular exercise, and continued cessation of smoking.  The patient is aware that without maximal medical management the underlying atherosclerotic disease process will progress, limiting the benefit of any interventions. The patient was given information about stroke prevention and what symptoms should prompt the patient to seek immediate medical care. Thank you for allowing [place] to participate in this patient's care.  Charisse March, RN, MSN, FNP-C [facility] Office: [phone]  Clinic Physician: Early  07/12/16 2:09 PM

## 2016-07-12 NOTE — Patient Instructions (Signed)
Stroke Prevention Some medical conditions and behaviors are associated with an increased chance of having a stroke. You may prevent a stroke by making healthy choices and managing medical conditions. How can I reduce my risk of having a stroke?  Stay physically active. Get at least 30 minutes of activity on most or all days.  Do not smoke. It may also be helpful to avoid exposure to secondhand smoke.  Limit alcohol use. Moderate alcohol use is considered to be:  No more than 2 drinks per day for men.  No more than 1 drink per day for nonpregnant women.  Eat healthy foods. This involves:  Eating 5 or more servings of fruits and vegetables a day.  Making dietary changes that address high blood pressure (hypertension), high cholesterol, diabetes, or obesity.  Manage your cholesterol levels.  Making food choices that are high in fiber and low in saturated fat, trans fat, and cholesterol may control cholesterol levels.  Take any prescribed medicines to control cholesterol as directed by your health care provider.  Manage your diabetes.  Controlling your carbohydrate and sugar intake is recommended to manage diabetes.  Take any prescribed medicines to control diabetes as directed by your health care provider.  Control your hypertension.  Making food choices that are low in salt (sodium), saturated fat, trans fat, and cholesterol is recommended to manage hypertension.  Ask your health care provider if you need treatment to lower your blood pressure. Take any prescribed medicines to control hypertension as directed by your health care provider.  If you are 18-39 years of age, have your blood pressure checked every 3-5 years. If you are 40 years of age or older, have your blood pressure checked every year.  Maintain a healthy weight.  Reducing calorie intake and making food choices that are low in sodium, saturated fat, trans fat, and cholesterol are recommended to manage  weight.  Stop drug abuse.  Avoid taking birth control pills.  Talk to your health care provider about the risks of taking birth control pills if you are over 35 years old, smoke, get migraines, or have ever had a blood clot.  Get evaluated for sleep disorders (sleep apnea).  Talk to your health care provider about getting a sleep evaluation if you snore a lot or have excessive sleepiness.  Take medicines only as directed by your health care provider.  For some people, aspirin or blood thinners (anticoagulants) are helpful in reducing the risk of forming abnormal blood clots that can lead to stroke. If you have the irregular heart rhythm of atrial fibrillation, you should be on a blood thinner unless there is a good reason you cannot take them.  Understand all your medicine instructions.  Make sure that other conditions (such as anemia or atherosclerosis) are addressed. Get help right away if:  You have sudden weakness or numbness of the face, arm, or leg, especially on one side of the body.  Your face or eyelid droops to one side.  You have sudden confusion.  You have trouble speaking (aphasia) or understanding.  You have sudden trouble seeing in one or both eyes.  You have sudden trouble walking.  You have dizziness.  You have a loss of balance or coordination.  You have a sudden, severe headache with no known cause.  You have new chest pain or an irregular heartbeat. Any of these symptoms may represent a serious problem that is an emergency. Do not wait to see if the symptoms will go away.   Get medical help at once. Call your local emergency services (911 in [place] Do not drive yourself to the hospital. This information is not intended to replace advice given to you by your health care provider. Make sure you discuss any questions you have with your health care provider. Document Released: 02/11/2004 Document Revised: 06/11/2015 Document Reviewed: 07/06/2012 Elsevier  Interactive Patient Education  2017 [employer].  

## 2016-09-07 NOTE — Addendum Note (Signed)
Addended by: Burton Apley A on: 09/07/2016 11:57 AM   Modules accepted: Orders

## 2016-09-13 ENCOUNTER — Telehealth: Payer: Self-pay | Admitting: *Deleted

## 2016-09-13 NOTE — Telephone Encounter (Signed)
Osteoporosis Reviewed by Dr. Loni Beckwith Worsening BMD -6%, will discuss at follow up visit.  T-Score -2.8

## 2016-10-23 ENCOUNTER — Other Ambulatory Visit: Payer: Self-pay | Admitting: Rheumatology

## 2016-10-24 NOTE — Telephone Encounter (Signed)
Last Visit: 06/20/16 Next Visit: 12/20/16  Okay to refill per Dr. Corliss Skains

## 2016-12-07 NOTE — Progress Notes (Signed)
Office Visit Note  Patient: Vicki Hernandez             Date of Birth: 07/06/1928           MRN: [id number]             PCP: Daisy Florooss, Charles Alan, MD Referring: Daisy Florooss, Charles Alan, MD Visit Date: 12/20/2016 Occupation: @GUAROCC @    Subjective:  Hands and knee pain    History of Present Illness: Vicki Hernandez is a 83 y.o. female with history of osteoarthritis, osteoporosis and disc disease.  Patient states she has increased bilateral hand pain, swelling, and stiffness.  She has been using Voltaren gel as needed.  She continues to have bilateral knee pain.  Her left knee is replaced about 8 years ago by Dr. Lequita HaltAluisio.  She denies any swelling of her knees. She also uses Voltaren gel on her knees. She states she takes Lyrica daily due to a nerve block she had performed during her TKA.  She also is experiencing increased lower back pain.  She states her cervical spine pain is not flaring. She denies any recent injury or activity change.  Denies muscle spasms.  She states the pain is worse after standing for long periods of time. She denies any numbness or tingling down her legs.  She has been taking Tramadol as needed for pain relief.           Activities of Daily Living:  Patient reports morning stiffness for 4-5 hours.   Patient Reports nocturnal pain.  Difficulty dressing/grooming: Denies Difficulty climbing stairs: Reports Difficulty getting out of chair: Reports Difficulty using hands for taps, buttons, cutlery, and/or writing: Reports   Review of Systems  Constitutional: Negative for fatigue and weakness.  HENT: Negative for mouth sores, mouth dryness and nose dryness.   Eyes: Positive for dryness. Negative for redness.  Respiratory: Positive for difficulty breathing (COPD). Negative for cough, hemoptysis and shortness of breath.   Cardiovascular: Positive for irregular heartbeat. Negative for chest pain, palpitations, hypertension and swelling in legs/feet.  Gastrointestinal:  Positive for constipation. Negative for blood in stool and diarrhea.  Endocrine: Negative for increased urination.  Genitourinary: Negative for painful urination.  Musculoskeletal: Positive for arthralgias, joint pain, joint swelling and morning stiffness. Negative for myalgias, muscle weakness, muscle tenderness and myalgias.  Skin: Negative for color change, pallor, rash, hair loss, nodules/bumps, redness, skin tightness, ulcers and sensitivity to sunlight.  Neurological: Negative for dizziness, numbness and headaches.  Hematological: Negative for swollen glands.  Psychiatric/Behavioral: Positive for depressed mood and sleep disturbance. The patient is nervous/anxious.     PMFS History:  Patient Active Problem List   Diagnosis Date Noted  . Primary osteoarthritis of both hands 06/07/2016  . Primary osteoarthritis of both knees 06/07/2016  . DDD (degenerative disc disease), cervical 06/07/2016  . DDD (degenerative disc disease), lumbar 06/07/2016  . History of macular degeneration 06/07/2016  . History of total left knee replacement (TKR) 06/07/2016  . S/P cervical spinal fusion 06/07/2016  . History of hypertension 03/30/2016  . History of gastroesophageal reflux (GERD) 03/30/2016  . Age-related osteoporosis without current pathological fracture 03/30/2016  . SVT (supraventricular tachycardia) (HCC) 11/29/2012  . Essential hypertension, benign 11/29/2012  . Tobacco use disorder 11/29/2012  . PVD (peripheral vascular disease) (HCC) 11/29/2012  . Aftercare following surgery of the circulatory system, NEC 10/18/2011  . Other symptoms involving cardiovascular system 04/05/2011  . Occlusion and stenosis of carotid artery 03/21/2011    Past Medical History:  Diagnosis Date  . Accessory kidney   . Angina    3 WEEKS  . Arthritis    OSTEO   . Atrial fibrillation (HCC) 2014  . Carotid artery bruit   . Complication of anesthesia    TAKES AWHILE TO WAKE UP   . Dysrhythmia     TACHYCARDIA   . GERD (gastroesophageal reflux disease)   . H/O hiatal hernia   . Heart murmur   . Hyperlipidemia   . Hypertension   . Macular degeneration     Family History  Problem Relation Age of Onset  . Hyperlipidemia Father   . Hypertension Father   . Heart disease Father        Heart Disease before age 79-  Bleeding problems  . Heart attack Father   . Stroke Mother   . Heart disease Mother        After age 71   Past Surgical History:  Procedure Laterality Date  . ABDOMINAL HYSTERECTOMY  1974   TAH w/ BSO  . APPENDECTOMY    . CARDIOVASCULAR STRESS TEST     5 YRS AGO     DR H SMITH  (NOW SEES DR Eldridge Dace)  . CAROTID ENDARTERECTOMY Left 03-25-11   cea  . CATARACT EXTRACTION W/ INTRAOCULAR LENS  IMPLANT, BILATERAL    . ENDARTERECTOMY  03/25/2011   Procedure: ENDARTERECTOMY CAROTID;  Surgeon: Pryor Ochoa, MD;  Location: [facility] OR;  Service: Vascular;  Laterality: Left;  Left Carotid Endarterectomy with dacron patch angioplasty, with resection of redundant common carotid with primary reanastamosis  . EYE SURGERY    . HEMORRHOID SURGERY    . JOINT REPLACEMENT  2012   Left Knee  . KNEE SURGERY    . SPINE SURGERY  2002, 2010   Lumbar disk/ spine surgeries X 2   By Dr. Shon Baton  . TONSILLECTOMY     Social History   Social History Narrative  . Not on file     Objective: Vital Signs: BP (!) 106/49 (BP Location: Left Arm, Patient Position: Sitting, Cuff Size: Normal)   Pulse 60   Resp 15   Ht 5\' 2"  (1.575 m)   Wt 132 lb (59.9 kg)   BMI 24.14 kg/m    Physical Exam  Constitutional: She is oriented to person, place, and time. She appears well-developed and well-nourished.  HENT:  Head: Normocephalic and atraumatic.  Eyes: Conjunctivae and EOM are normal.  Neck: Normal range of motion.  Cardiovascular: Normal rate, regular rhythm and intact distal pulses.  Murmur heard. Pulmonary/Chest: Effort normal and breath sounds normal.  Abdominal: Soft. Bowel sounds are normal.    Lymphadenopathy:    She has no cervical adenopathy.  Neurological: She is alert and oriented to person, place, and time.  Skin: Skin is warm and dry. Capillary refill takes less than 2 seconds.  Psychiatric: She has a normal mood and affect. Her behavior is normal.  Nursing note and vitals reviewed.    Musculoskeletal Exam: C-spine and lumbar spine limited ROM with discomfort.  Shoulder forward flexion slightly limited bilaterally.  Elbow and wrist ROM good with no synovitis.  Significant PIP and DIP synovial thickening bilaterally consistent with osteoarthritis.  CMC tenderness and synovial thickening bilaterally.  Hip joints, knee joints, and ankle joints good ROM.  MTPs, PIPs, and DIPs good ROM.     CDAI Exam: No CDAI exam completed.    Investigation: No additional findings. CBC Latest Ref Rng & Units 05/29/2016 03/26/2011 03/24/2011  WBC 4.0 -  10.5 K/uL 10.0 6.9 9.4  Hemoglobin 12.0 - 15.0 g/dL 69.6 11.0(L) 13.3  Hematocrit 36.0 - 46.0 % 38.5 33.6(L) 40.0  Platelets 150 - 400 K/uL 197 153 217   CMP Latest Ref Rng & Units 05/29/2016 03/26/2011 03/24/2011  Glucose 65 - 99 mg/dL 295(M) 841(L) 80  BUN 6 - 20 mg/dL 17 12 21   Creatinine 0.44 - 1.00 mg/dL 2.44(W) 1.02 7.25  Sodium 135 - 145 mmol/L 139 139 139  Potassium 3.5 - 5.1 mmol/L 3.6 3.9 3.9  Chloride 101 - 111 mmol/L 102 104 100  CO2 22 - 32 mmol/L 27 29 31   Calcium 8.9 - 10.3 mg/dL 9.2 8.6 9.5  Total Protein 6.5 - 8.1 g/dL 7.0 - 7.2  Total Bilirubin 0.3 - 1.2 mg/dL 0.7 - 0.3  Alkaline Phos 38 - 126 U/L 55 - 71  AST 15 - 41 U/L 20 - 16  ALT 14 - 54 U/L 12(L) - 9    Imaging: Xr Hand 2 View Left  Result Date: 12/20/2016 Severe CMC joint, PIP, and DIP joint space narrowing. No joint erosion. No radiocarpal or intercarpal joint space narrowing. Impression: Findings consistent with severe osteoarthritis   Xr Hand 2 View Right  Result Date: 12/20/2016 Severe CMC joint, PIP, and DIP joint space narrowing. 1st and 2nd MCP joint  space narrowing. No joint erosion. No radiocarpal or intercarpal joint space narrowing. Impression: Findings consistent with severe osteoarthritis   Xr Knee 3 View Right  Result Date: 12/20/2016 Moderate medial joint space narrowing.  Intercondylar osteophytes present.  Severe patellofemoral joint space narrowing. No chondrocalcinosis.  Impression: Findings consistent with moderate osteoarthritis.  Severe chondromalacia patella.     Xr Lumbar Spine 2-3 Views  Result Date: 12/20/2016 No significant disc space narrowing.  Mild wedging of T9 and T10 is noted. Anterior osteophytes present.  Facet joint arthropathy.  Calcifications of the aorta noted.  SI joint normal.  Mild scoliosis present.     Speciality Comments: No specialty comments available.    Procedures:  No procedures performed Allergies: Sulfa antibiotics; Codeine; and Cymbalta [duloxetine hcl]   Assessment / Plan:     Visit Diagnoses: Primary osteoarthritis of both hands - Patient has significant PIP and DIP synovial thickening consistent with osteoarthritis.  X-rays were ordered today, which revealed findings consistent with severe osteoarthritis.  She can continue using Voltaren gel as needed. Plan: XR Hand 2 View Right, XR Hand 2 View Left  Primary osteoarthritis of both knees -X-rays today revealed moderate osteoarthritis and severe chondromalacia.  She will continue using Voltaren gel.  Plan: XR KNEE 3 VIEW RIGHT  History of total left knee replacement (TKR) - Dr Lequita Halt replaced her left knee about 8 years ago per patient. Continue to use Voltaren gel.    DDD (degenerative disc disease), lumbar:  Patient is having significant pain and limited ROM.  X-ray revealed facet joint arthropathy and T9 and T10 wedging consistent with a previous compression fracture.  She has no discomfort in this region.  SI joints normal.  She is followed by Dr. Shon Baton.  She declined physical therapy and a MRI of her back today.  Back exercises  provided in a handout today. Plan: XR Lumbar Spine 2-3 Views  S/P cervical spinal fusion -followed by Dr Otelia Sergeant.  Limited ROM.  Minimal discomfort currently.  Age-related osteoporosis without current pathological fracture - 06/12/2013 T -2.5 lrft femoral, BMD 0.685. She has had intolerance to oral bisphosphonates in the past. She recalls having IV Reclast September 2016  only once.  Noctural muscle cramps: Discussed the use of Magnesium malate 250 mg for muscle cramps and spasms.  Explained potential side effects including diarrhea and drowsiness.  Suggested she take it in the evening before bed.  If she tolerates the 250 mg dose she can increase to 500 mg.    Other medical conditions are listed as follows:   History of hypertension: well controlled in the office today.   History of gastroesophageal reflux (GERD)  History of macular degeneration: Continues to have dry eyes.   History of peripheral vascular disease  History of supraventricular tachycardia  Tobacco use disorder    Orders: Orders Placed This Encounter  Procedures  . XR Hand 2 View Right  . XR Hand 2 View Left  . XR KNEE 3 VIEW RIGHT  . XR Lumbar Spine 2-3 Views   No orders of the defined types were placed in this encounter.   Face-to-face time spent with patient was 30 minutes. Greater than 50% of time was spent in counseling and coordination of care.  Follow-Up Instructions: Return in about 6 months (around 06/20/2017) for Osteoarthritis DDD OP.   Note - This record has been created using [employer] software.  Chart creation errors have been sought, but may not always  have been located. Such creation errors do not reflect on  the standard of medical care.

## 2016-12-20 ENCOUNTER — Ambulatory Visit (INDEPENDENT_AMBULATORY_CARE_PROVIDER_SITE_OTHER): Payer: Self-pay

## 2016-12-20 ENCOUNTER — Ambulatory Visit: Payer: Medicare Other | Admitting: Rheumatology

## 2016-12-20 ENCOUNTER — Encounter: Payer: Self-pay | Admitting: Rheumatology

## 2016-12-20 VITALS — BP 106/49 | HR 60 | Resp 15 | Ht 62.0 in | Wt 132.0 lb

## 2016-12-20 DIAGNOSIS — M170 Bilateral primary osteoarthritis of knee: Secondary | ICD-10-CM

## 2016-12-20 DIAGNOSIS — M51369 Other intervertebral disc degeneration, lumbar region without mention of lumbar back pain or lower extremity pain: Secondary | ICD-10-CM

## 2016-12-20 DIAGNOSIS — Z8669 Personal history of other diseases of the nervous system and sense organs: Secondary | ICD-10-CM | POA: Diagnosis not present

## 2016-12-20 DIAGNOSIS — Z96652 Presence of left artificial knee joint: Secondary | ICD-10-CM

## 2016-12-20 DIAGNOSIS — Z8679 Personal history of other diseases of the circulatory system: Secondary | ICD-10-CM

## 2016-12-20 DIAGNOSIS — Z981 Arthrodesis status: Secondary | ICD-10-CM | POA: Diagnosis not present

## 2016-12-20 DIAGNOSIS — M810 Age-related osteoporosis without current pathological fracture: Secondary | ICD-10-CM

## 2016-12-20 DIAGNOSIS — F17200 Nicotine dependence, unspecified, uncomplicated: Secondary | ICD-10-CM | POA: Diagnosis not present

## 2016-12-20 DIAGNOSIS — M5030 Other cervical disc degeneration, unspecified cervical region: Secondary | ICD-10-CM

## 2016-12-20 DIAGNOSIS — M5136 Other intervertebral disc degeneration, lumbar region: Secondary | ICD-10-CM | POA: Diagnosis not present

## 2016-12-20 DIAGNOSIS — M19042 Primary osteoarthritis, left hand: Secondary | ICD-10-CM | POA: Diagnosis not present

## 2016-12-20 DIAGNOSIS — Z8719 Personal history of other diseases of the digestive system: Secondary | ICD-10-CM | POA: Diagnosis not present

## 2016-12-20 DIAGNOSIS — M19041 Primary osteoarthritis, right hand: Secondary | ICD-10-CM

## 2016-12-20 DIAGNOSIS — R252 Cramp and spasm: Secondary | ICD-10-CM

## 2016-12-20 NOTE — Patient Instructions (Addendum)
Muscle spasms and cramps: Take Magneiusm malate 250 mg, can increase to 500 mg if tolerate  Side effects: diarrhea and drowsiness (Can take at night)     Back Exercises If you have pain in your back, do these exercises 2-3 times each day or as told by your doctor. When the pain goes away, do the exercises once each day, but repeat the steps more times for each exercise (do more repetitions). If you do not have pain in your back, do these exercises once each day or as told by your doctor. Exercises Single Knee to Chest  Do these steps 3-5 times in a row for each leg: 1. Lie on your back on a firm bed or the floor with your legs stretched out. 2. Bring one knee to your chest. 3. Hold your knee to your chest by grabbing your knee or thigh. 4. Pull on your knee until you feel a gentle stretch in your lower back. 5. Keep doing the stretch for 10-30 seconds. 6. Slowly let go of your leg and straighten it.  Pelvic Tilt  Do these steps 5-10 times in a row: 1. Lie on your back on a firm bed or the floor with your legs stretched out. 2. Bend your knees so they point up to the ceiling. Your feet should be flat on the floor. 3. Tighten your lower belly (abdomen) muscles to press your lower back against the floor. This will make your tailbone point up to the ceiling instead of pointing down to your feet or the floor. 4. Stay in this position for 5-10 seconds while you gently tighten your muscles and breathe evenly.  Cat-Cow  Do these steps until your lower back bends more easily: 1. Get on your hands and knees on a firm surface. Keep your hands under your shoulders, and keep your knees under your hips. You may put padding under your knees. 2. Let your head hang down, and make your tailbone point down to the floor so your lower back is round like the back of a cat. 3. Stay in this position for 5 seconds. 4. Slowly lift your head and make your tailbone point up to the ceiling so your back hangs low  (sags) like the back of a cow. 5. Stay in this position for 5 seconds.  Press-Ups  Do these steps 5-10 times in a row: 1. Lie on your belly (face-down) on the floor. 2. Place your hands near your head, about shoulder-width apart. 3. While you keep your back relaxed and keep your hips on the floor, slowly straighten your arms to raise the top half of your body and lift your shoulders. Do not use your back muscles. To make yourself more comfortable, you may change where you place your hands. 4. Stay in this position for 5 seconds. 5. Slowly return to lying flat on the floor.  Bridges  Do these steps 10 times in a row: 1. Lie on your back on a firm surface. 2. Bend your knees so they point up to the ceiling. Your feet should be flat on the floor. 3. Tighten your butt muscles and lift your butt off of the floor until your waist is almost as high as your knees. If you do not feel the muscles working in your butt and the back of your thighs, slide your feet 1-2 inches farther away from your butt. 4. Stay in this position for 3-5 seconds. 5. Slowly lower your butt to the floor, and let  your butt muscles relax.  If this exercise is too easy, try doing it with your arms crossed over your chest. Belly Crunches  Do these steps 5-10 times in a row: 1. Lie on your back on a firm bed or the floor with your legs stretched out. 2. Bend your knees so they point up to the ceiling. Your feet should be flat on the floor. 3. Cross your arms over your chest. 4. Tip your chin a little bit toward your chest but do not bend your neck. 5. Tighten your belly muscles and slowly raise your chest just enough to lift your shoulder blades a tiny bit off of the floor. 6. Slowly lower your chest and your head to the floor.  Back Lifts Do these steps 5-10 times in a row: 1. Lie on your belly (face-down) with your arms at your sides, and rest your forehead on the floor. 2. Tighten the muscles in your legs and your  butt. 3. Slowly lift your chest off of the floor while you keep your hips on the floor. Keep the back of your head in line with the curve in your back. Look at the floor while you do this. 4. Stay in this position for 3-5 seconds. 5. Slowly lower your chest and your face to the floor.  Contact a doctor if:  Your back pain gets a lot worse when you do an exercise.  Your back pain does not lessen 2 hours after you exercise. If you have any of these problems, stop doing the exercises. Do not do them again unless your doctor says it is okay. Get help right away if:  You have sudden, very bad back pain. If this happens, stop doing the exercises. Do not do them again unless your doctor says it is okay. This information is not intended to replace advice given to you by your health care provider. Make sure you discuss any questions you have with your health care provider. Document Released: 02/05/2010 Document Revised: 06/11/2015 Document Reviewed: 02/27/2014 Elsevier Interactive Patient Education  [employer].

## 2017-02-13 NOTE — Progress Notes (Signed)
Cardiology Office Note   Date:  02/15/2017   ID:  Vicki Hernandez Single, DOB 06/21/1928, MRN [id number]  PCP:  Daisy Florooss, Charles Alan, MD    No chief complaint on file.  SVT  Wt Readings from Last 3 Encounters:  02/15/17 135 lb 6.4 oz (61.4 kg)  12/20/16 132 lb (59.9 kg)  07/12/16 120 lb (54.4 kg)       History of Present Illness: Vicki Hernandez Fay is a 84 y.o. female  who has had CEA, after having a knot and bruit noted in 2013. She had a rapid pulse in 2014 while at the vascular surgeon. Sx. resolved prior to ECG. She wore a monitor in 2014. She did not have any palpitations at that time while wearing the monitor. No arrhythmia noted at that time. She has been taken to the hospital years ago for this. She has used a beta blocker as needed which quickly resolves her symptoms.   2014 echo showed Normal LV function, HOCM anatomy.  Several years [facility], [facility] and has to walk more to get to the dining hall.  No GERD sx with walking.  GERD seems to be the trigger for her SVT.  She has had GERD but this decreased by eating more salads.   SVT Quickly resolves with metoprolol in the past.  SHe did not want to increase the dose at the last visit.    She stopped smoking cold [place]key a few years ago.  Still has rare episodes of palpitations.  The metoprolol seems to work well.  Worse when her COPD flares.  Denies : Chest pain. Dizziness. Leg edema. Nitroglycerin use. Orthopnea. Paroxysmal nocturnal dyspnea. Shortness of breath. Syncope.   She walks a lot to get to her meals.  She feels well walking.  [facility]. She feels some occasional visual disturbance.       Past Medical History:  Diagnosis Date  . Accessory kidney   . Angina    3 WEEKS  . Arthritis    OSTEO   . Atrial fibrillation (HCC) 2014  . Carotid artery bruit   . Complication of anesthesia    TAKES AWHILE TO WAKE UP   . Dysrhythmia    TACHYCARDIA   . GERD (gastroesophageal reflux  disease)   . H/O hiatal hernia   . Heart murmur   . Hyperlipidemia   . Hypertension   . Macular degeneration     Past Surgical History:  Procedure Laterality Date  . ABDOMINAL HYSTERECTOMY  1974   TAH w/ BSO  . APPENDECTOMY    . CARDIOVASCULAR STRESS TEST     5 YRS AGO     DR H SMITH  (NOW SEES DR Eldridge DaceVARANASI)  . CAROTID ENDARTERECTOMY Left 03-25-11   cea  . CATARACT EXTRACTION W/ INTRAOCULAR LENS  IMPLANT, BILATERAL    . ENDARTERECTOMY  03/25/2011   Procedure: ENDARTERECTOMY CAROTID;  Surgeon: Pryor OchoaJames D Lawson, MD;  [facility];  Service: Vascular;  Laterality: Left;  Left Carotid Endarterectomy with dacron patch angioplasty, with resection of redundant common carotid with primary reanastamosis  . EYE SURGERY    . HEMORRHOID SURGERY    . JOINT REPLACEMENT  2012   Left Knee  . KNEE SURGERY    . SPINE SURGERY  2002, 2010   Lumbar disk/ spine surgeries X 2   By Dr. Shon BatonBrooks  . TONSILLECTOMY       Current Outpatient Medications  Medication Sig Dispense Refill  .  aspirin 81 MG tablet Take 81 mg by mouth every morning.     . diclofenac sodium (VOLTAREN) 1 % GEL APPLY 3 GRAMS TO 3 LARGE JOINTS UP TO 3 TIMES A DAY AS NEEDED 300 g 0  . esomeprazole (NEXIUM) 20 MG capsule Take 20 mg by mouth daily at 12 noon.    . hydrocortisone (ANUSOL-HC) 25 MG suppository Place 1 suppository (25 mg total) rectally 2 (two) times daily. (Patient taking differently: Place 25 mg rectally 2 (two) times daily. ) 12 suppository 0  . losartan-hydrochlorothiazide (HYZAAR) 50-12.5 MG per tablet Take 1 tablet by mouth every morning.     Marland Kitchen LYRICA 50 MG capsule Take 50 mg by mouth daily.  1  . metoprolol tartrate (LOPRESSOR) 25 MG tablet TAKE 1/2 TABLET TWICE DAILY, MAY TAKE AN EXTRA 1 TABLET AS NEEDED FOR PALPITATIONS 30 tablet 4  . Multiple Vitamins-Minerals (EYE-VITES PO) Take 1 tablet by mouth daily.     Marland Kitchen NITROSTAT 0.4 MG SL tablet PLACE 1 TABLET UNDER THE TONGUE EVERY 5 MINS AS NEEDED FOR CHEST PAIN 25 tablet 1    . Polyethyl Glycol-Propyl Glycol (SYSTANE OP) Place 1-2 drops into both eyes daily as needed. For dry eyes    . polyethylene glycol (MIRALAX / GLYCOLAX) packet Take 17 g by mouth daily as needed for mild constipation or moderate constipation.    . predniSONE (STERAPRED UNI-PAK 21 TAB) 10 MG (21) TBPK tablet TAKE 6 TABLETS ON DAY 1 AS DIRECTED ON PACKAGE AND DECREASE BY 1 TAB EACH DAY FOR A TOTAL OF 6 DAYS  0  . simvastatin (ZOCOR) 40 MG tablet Take 40 mg by mouth every evening.    . SYMBICORT 160-4.5 MCG/ACT inhaler     . traMADol (ULTRAM) 50 MG tablet Take 50 mg by mouth every 6 (six) hours as needed. For pain    . VITAMIN D, ERGOCALCIFEROL, PO Take 1 capsule by mouth every morning.      No current facility-administered medications for this visit.     Allergies:   Sulfa antibiotics; Codeine; and Cymbalta [duloxetine hcl]    Social History:  The patient  reports that she quit smoking about 13 months ago. Her smoking use included cigarettes. She smoked 0.50 packs per day. she has never used smokeless tobacco. She reports that she does not drink alcohol or use drugs.   Family History:  The patient's family history includes Heart attack in her father; Heart disease in her father and mother; Hyperlipidemia in her father; Hypertension in her father; Stroke in her mother.    ROS:  Please see the history of present illness.   Otherwise, review of systems are positive for recent cough-improving.   All other systems are reviewed and negative.    PHYSICAL EXAM: VS:  BP 128/64   Pulse 64   Ht 5\' 2"  (1.575 m)   Wt 135 lb 6.4 oz (61.4 kg)   SpO2 97%   BMI 24.76 kg/m  , BMI Body mass index is 24.76 kg/m. GEN: Well nourished, well developed, in no acute distress  HEENT: normal  Neck: no JVD, carotid bruits, or masses Cardiac: RRR; 2/6 systolic murmurs, no rubs, or gallops,no edema  Respiratory:  clear to auscultation bilaterally, normal work of breathing GI: soft, nontender, nondistended, +  BS MS: no deformity or atrophy  Skin: warm and dry, no rash Neuro:  Strength and sensation are intact Psych: euthymic mood, full affect   EKG:   The ekg ordered today demonstrates NSR,  septal Q waves, no ST changes   Recent Labs: 05/29/2016: ALT 12; BUN 17; Creatinine, Ser 1.11; Hemoglobin 12.5; Platelets 197; Potassium 3.6; Sodium 139   Lipid Panel No results found for: CHOL, TRIG, HDL, CHOLHDL, VLDL, LDLCALC, LDLDIRECT   Other studies Reviewed: Additional studies/ records that were reviewed today with results demonstrating: 2014 echo showed Normal LV function, HOCM anatomy.  Carotid Doppler shows patent carotids, in 6/18.   ASSESSMENT AND PLAN:  1. SVT: Contiue metoprolol.  She can continue to use this prn as well, up to a full metoprolol tab.  Sx are controlled.  If symptoms get more frequent, would increase her daily dose of metoprolol to hopefully suppress the arrhythmia. 2. HTN: Well controlled.  Would be careful regarding vasodilators given her HOCM anatomy which explains the murmur. No CHF sx.   3. PAD: Prior carotid disease.  Continue simvastatin. She gets regular ultrasounds.     Current medicines are reviewed at length with the patient today.  The patient concerns regarding her medicines were addressed.  The following changes have been made:  No change  Labs/ tests ordered today include:  No orders of the defined types were placed in this encounter.   Recommend 150 minutes/week of aerobic exercise Low fat, low carb, high fiber diet recommended  Disposition:   FU in 1 year   Signed, Lance Muss, MD  02/15/2017 11:04 AM    [facility] [address] [place], [place], [place]  [address] Phone: [phone]; Fax: [phone]

## 2017-02-15 ENCOUNTER — Encounter: Payer: Self-pay | Admitting: Interventional Cardiology

## 2017-02-15 ENCOUNTER — Ambulatory Visit: Payer: Medicare Other | Admitting: Interventional Cardiology

## 2017-02-15 ENCOUNTER — Encounter (INDEPENDENT_AMBULATORY_CARE_PROVIDER_SITE_OTHER): Payer: Self-pay

## 2017-02-15 VITALS — BP 128/64 | HR 64 | Ht 62.0 in | Wt 135.4 lb

## 2017-02-15 DIAGNOSIS — I6529 Occlusion and stenosis of unspecified carotid artery: Secondary | ICD-10-CM

## 2017-02-15 DIAGNOSIS — I10 Essential (primary) hypertension: Secondary | ICD-10-CM

## 2017-02-15 DIAGNOSIS — I471 Supraventricular tachycardia: Secondary | ICD-10-CM

## 2017-02-15 DIAGNOSIS — Z9889 Other specified postprocedural states: Secondary | ICD-10-CM

## 2017-02-15 DIAGNOSIS — I739 Peripheral vascular disease, unspecified: Secondary | ICD-10-CM | POA: Diagnosis not present

## 2017-02-15 MED ORDER — NITROGLYCERIN 0.4 MG SL SUBL: 0.4000 mg | SUBLINGUAL_TABLET | SUBLINGUAL | 3 refills | Status: DC | PRN

## 2017-02-15 NOTE — Patient Instructions (Signed)

## 2017-06-26 ENCOUNTER — Other Ambulatory Visit: Payer: Self-pay | Admitting: Rheumatology

## 2017-06-26 NOTE — Telephone Encounter (Signed)
Last Visit: 12/20/16 Next visit: due in June 2019. Message sent to the front to schedule.  Okay to refill per Dr. Corliss Skainseveshwar

## 2017-06-28 ENCOUNTER — Telehealth: Payer: Self-pay | Admitting: *Deleted

## 2017-06-28 NOTE — Telephone Encounter (Signed)
Voltaren Gel has been approved. Left message to advise patient.

## 2017-06-28 NOTE — Telephone Encounter (Signed)
Received prior authorization for Voltaren Gel. Submitted PA through cover my meds.  Will updated when decision has been reached.

## 2017-07-10 NOTE — Progress Notes (Signed)
Office Visit Note  Patient: Vicki Hernandez             Date of Birth: [birth year]           MRN: [id number]             PCP: Daisy Floro, MD Referring: Daisy Floro, MD Visit Date: 07/24/2017 Occupation: @GUAROCC @    Subjective:  Bilateral knee pain   History of Present Illness: Vicki Hernandez is a 84 y.o. female with history of osteoarthritis, DDD, and osteoporosis.  Patient reports that she continues to have pain in bilateral hands and bilateral knee joints.  She denies any joint swelling.  She states that this morning she strained her lower back and has been having some muscle tenderness and spasms.  She states the pain is most severe when she is standing.  She states that she walks about 1 mile daily.  Activities of Daily Living:  Patient reports morning stiffness for 5 minutes.   Patient Reports nocturnal pain.  Difficulty dressing/grooming: Denies Difficulty climbing stairs: Reports Difficulty getting out of chair: Reports Difficulty using hands for taps, buttons, cutlery, and/or writing: Reports   Review of Systems  Constitutional: Positive for fatigue.  HENT: Negative for mouth sores, mouth dryness and nose dryness.   Eyes: Positive for dryness. Negative for pain and visual disturbance.  Respiratory: Negative for cough, hemoptysis, shortness of breath and difficulty breathing.   Cardiovascular: Negative for chest pain, palpitations, hypertension and swelling in legs/feet.  Gastrointestinal: Positive for constipation. Negative for blood in stool and diarrhea.  Endocrine: Negative for increased urination.  Genitourinary: Negative for painful urination.  Musculoskeletal: Positive for arthralgias, joint pain, morning stiffness and muscle tenderness. Negative for joint swelling, myalgias, muscle weakness and myalgias.  Skin: Positive for rash. Negative for color change, pallor, hair loss, nodules/bumps, skin tightness, ulcers and sensitivity to sunlight.    Allergic/Immunologic: Negative for susceptible to infections.  Neurological: Negative for dizziness, numbness, headaches and weakness.  Hematological: Negative for swollen glands.  Psychiatric/Behavioral: Positive for depressed mood. Negative for sleep disturbance. The patient is not nervous/anxious.     PMFS History:  Patient Active Problem List   Diagnosis Date Noted  . Primary osteoarthritis of both hands 06/07/2016  . Primary osteoarthritis of both knees 06/07/2016  . DDD (degenerative disc disease), cervical 06/07/2016  . DDD (degenerative disc disease), lumbar 06/07/2016  . History of macular degeneration 06/07/2016  . History of total left knee replacement (TKR) 06/07/2016  . S/P cervical spinal fusion 06/07/2016  . History of hypertension 03/30/2016  . History of gastroesophageal reflux (GERD) 03/30/2016  . Age-related osteoporosis without current pathological fracture 03/30/2016  . SVT (supraventricular tachycardia) (HCC) 11/29/2012  . Essential hypertension, benign 11/29/2012  . Tobacco use disorder 11/29/2012  . PVD (peripheral vascular disease) (HCC) 11/29/2012  . Aftercare following surgery of the circulatory system, NEC 10/18/2011  . Other symptoms involving cardiovascular system 04/05/2011  . Occlusion and stenosis of carotid artery 03/21/2011    Past Medical History:  Diagnosis Date  . Accessory kidney   . Angina    3 WEEKS  . Arthritis    OSTEO   . Atrial fibrillation (HCC) 2014  . Carotid artery bruit   . Complication of anesthesia    TAKES AWHILE TO WAKE UP   . Dysrhythmia    TACHYCARDIA   . GERD (gastroesophageal reflux disease)   . H/O hiatal hernia   . Heart murmur   . Hyperlipidemia   .  Hypertension   . Macular degeneration     Family History  Problem Relation Age of Onset  . Hyperlipidemia Father   . Hypertension Father   . Heart disease Father        Heart Disease before age 82-  Bleeding problems  . Heart attack Father   . Stroke  Mother   . Heart disease Mother        After age 82   Past Surgical History:  Procedure Laterality Date  . ABDOMINAL HYSTERECTOMY  1974   TAH w/ BSO  . APPENDECTOMY    . CARDIOVASCULAR STRESS TEST     5 YRS AGO     DR H SMITH  (NOW SEES DR Eldridge DaceVARANASI)  . CAROTID ENDARTERECTOMY Left 03-25-11   cea  . CATARACT EXTRACTION W/ INTRAOCULAR LENS  IMPLANT, BILATERAL    . ENDARTERECTOMY  03/25/2011   Procedure: ENDARTERECTOMY CAROTID;  Surgeon: Pryor OchoaJames D Lawson, MD;  [facility];  Service: Vascular;  Laterality: Left;  Left Carotid Endarterectomy with dacron patch angioplasty, with resection of redundant common carotid with primary reanastamosis  . EYE SURGERY    . HEMORRHOID SURGERY    . JOINT REPLACEMENT  2012   Left Knee  . KNEE SURGERY    . SPINE SURGERY  2002, 2010   Lumbar disk/ spine surgeries X 2   By Dr. Shon BatonBrooks  . TONSILLECTOMY     Social History   Social History Narrative  . Not on file     Objective: Vital Signs: BP (!) 122/58 (BP Location: Left Arm, Patient Position: Sitting, Cuff Size: Normal)   Pulse 62   Resp 14   Ht 5\' 1"  (1.549 m)   Wt 141 lb 8 oz (64.2 kg)   BMI 26.74 kg/m    Physical Exam  Constitutional: She is oriented to person, place, and time. She appears well-developed and well-nourished.  HENT:  Head: Normocephalic and atraumatic.  Eyes: Conjunctivae and EOM are normal.  Neck: Normal range of motion.  Cardiovascular: Normal rate, regular rhythm and intact distal pulses.  Murmur heard. Pulmonary/Chest: Effort normal and breath sounds normal.  Abdominal: Soft. Bowel sounds are normal.  Lymphadenopathy:    She has no cervical adenopathy.  Neurological: She is alert and oriented to person, place, and time.  Skin: Skin is warm and dry. Capillary refill takes less than 2 seconds.  Psychiatric: She has a normal mood and affect. Her behavior is normal.  Nursing note and vitals reviewed.    Musculoskeletal Exam: C-spine limited ROM.  Thoracic kyphosis.  Midline spinal tenderness in the lumbar spine.  Bilateral SI joint tenderness. Shoulder joints, elbow joints, wrist joints, MCPs, PIPs, and DIPs good ROM with no synovitis.  PIP and DIP synovial thickening consistent with osteoarthritis.  CMC joint synovial thickening bilaterally.  Hip joints, knee joints, ankle joints, MTPs, PIPs, and DIPs good ROM with no synovitis.  No warmth or effusion of knee joints.   CDAI Exam: No CDAI exam completed.    Investigation: No additional findings.   Imaging: No results found.  Speciality Comments: No specialty comments available.    Procedures:  No procedures performed Allergies: Sulfa antibiotics; Codeine; and Cymbalta [duloxetine hcl]    Assessment / Plan:     Visit Diagnoses: Primary osteoarthritis of both hands: She has severe PIP and DIP synovial thickening consistent with osteoarthritis of bilateral hands.  She has bilateral CMC joint synovial thickening.  She has complete fist formation bilaterally.  She has no synovitis on  exam.  Joint protection and muscle strengthening were discussed.  She has been wearing electric battery-operated gloves which have been providing some relief.  Primary osteoarthritis of right knee -Moderate osteoarthritis and severe chondromalacia: No warmth or effusion.  She has good range of motion on exam.  She has chronic pain in her right knee joint.  She declined a cortisone injection today.  History of total left knee replacement (TKR): Chronic pain.  She has full flexion and extension exam.  She continues to have chronic discomfort in her left knee and placement.  No warmth or effusion were noted on exam.  DDD (degenerative disc disease), cervical: She has limited range of motion on exam.  She has no discomfort at this time.  S/P cervical spinal fusion - followed by Dr Otelia Sergeant  DDD (degenerative disc disease), lumbar: She has midline spinal tenderness in the lumbar region.  She has no symptoms of sciatica at this  time.  She strained her lower back this morning when bending down to pick something up.  She declined an x-ray at this time.  She declined a referral to physical therapy.  She was given a handout of back exercises that she can try at home.  We discussed using a heating pad and taking tylenol for pain relief.  We advised her to avoid NSAIDs.  She declined a muscle relaxer at this time after we discussed potential side effects.    Age-related osteoporosis without current pathological fracture - 06/12/2013 T -2.5 left femoral, BMD 0.685. She had an intolerance to oral bisphosphonates in the past.  She takes a vitamin D supplement daily.   Other medical conditions are listed as follows:  History of supraventricular tachycardia  History of hypertension  History of gastroesophageal reflux (GERD)  History of peripheral vascular disease  History of macular degeneration  Tobacco use disorder    Orders: No orders of the defined types were placed in this encounter.  No orders of the defined types were placed in this encounter.    Follow-Up Instructions: Return in about 6 months (around 01/24/2018) for Osteoarthritis, DDD.   Gearldine Bienenstock, PA-C   I examined and evaluated the patient with Sherron Ales PA.  Patient has some discomfort with range of motion of lumbar spine and tenderness over SI joints on my exam.  We had detailed discussion regarding muscle strengthening exercises.  The plan of care was discussed as noted above.  Pollyann Savoy, MD Note - This record has been created using Animal nutritionist.  Chart creation errors have been sought, but may not always  have been located. Such creation errors do not reflect on  the standard of medical care.

## 2017-07-24 ENCOUNTER — Encounter: Payer: Self-pay | Admitting: Rheumatology

## 2017-07-24 ENCOUNTER — Ambulatory Visit: Payer: Medicare Other | Admitting: Rheumatology

## 2017-07-24 VITALS — BP 122/58 | HR 62 | Resp 14 | Ht 61.0 in | Wt 141.5 lb

## 2017-07-24 DIAGNOSIS — M19042 Primary osteoarthritis, left hand: Secondary | ICD-10-CM | POA: Diagnosis not present

## 2017-07-24 DIAGNOSIS — Z96652 Presence of left artificial knee joint: Secondary | ICD-10-CM

## 2017-07-24 DIAGNOSIS — M19041 Primary osteoarthritis, right hand: Secondary | ICD-10-CM | POA: Diagnosis not present

## 2017-07-24 DIAGNOSIS — Z8679 Personal history of other diseases of the circulatory system: Secondary | ICD-10-CM

## 2017-07-24 DIAGNOSIS — M5030 Other cervical disc degeneration, unspecified cervical region: Secondary | ICD-10-CM

## 2017-07-24 DIAGNOSIS — Z981 Arthrodesis status: Secondary | ICD-10-CM

## 2017-07-24 DIAGNOSIS — Z8719 Personal history of other diseases of the digestive system: Secondary | ICD-10-CM | POA: Diagnosis not present

## 2017-07-24 DIAGNOSIS — F17200 Nicotine dependence, unspecified, uncomplicated: Secondary | ICD-10-CM

## 2017-07-24 DIAGNOSIS — M1711 Unilateral primary osteoarthritis, right knee: Secondary | ICD-10-CM

## 2017-07-24 DIAGNOSIS — M810 Age-related osteoporosis without current pathological fracture: Secondary | ICD-10-CM | POA: Diagnosis not present

## 2017-07-24 DIAGNOSIS — Z8669 Personal history of other diseases of the nervous system and sense organs: Secondary | ICD-10-CM

## 2017-07-24 DIAGNOSIS — M5136 Other intervertebral disc degeneration, lumbar region: Secondary | ICD-10-CM | POA: Diagnosis not present

## 2017-07-24 NOTE — Patient Instructions (Signed)

## 2017-09-21 IMAGING — CR DG ABDOMEN ACUTE W/ 1V CHEST
3 series · 3 of 3 positions shown · non-contrast
Comparison: March 03, 2015 chest x-ray

CLINICAL DATA: Constipation for 8 days.

EXAM:
DG ABDOMEN ACUTE W/ 1V CHEST

[w abdomen decub]
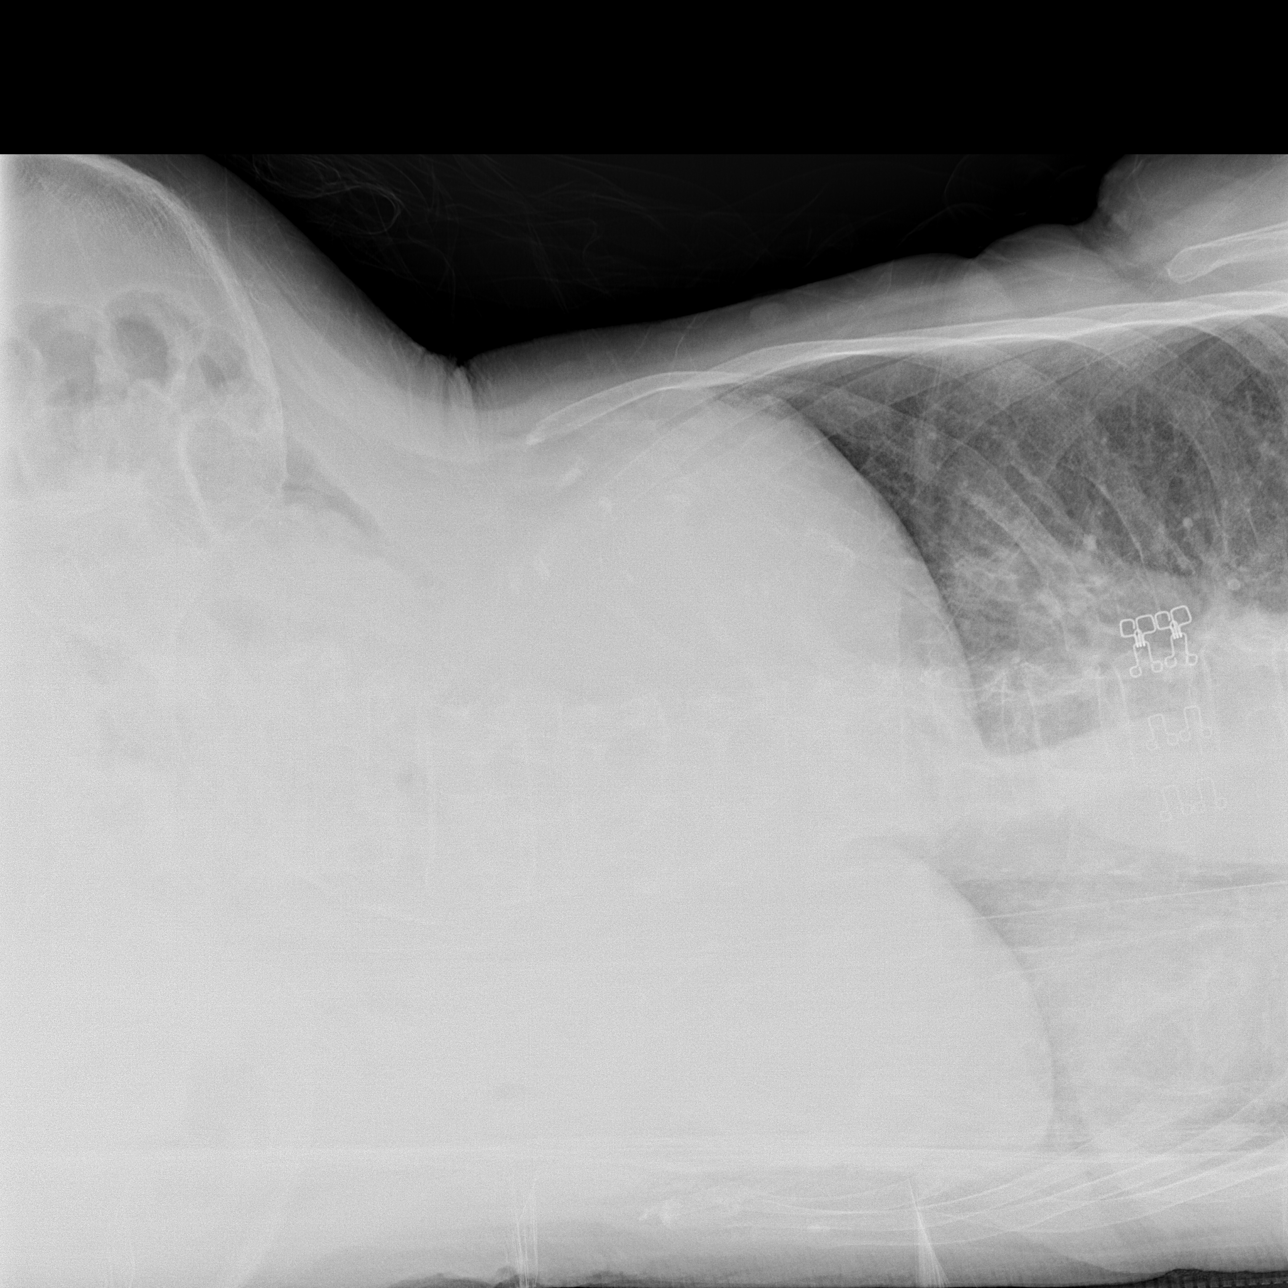

[x abdomen supine]
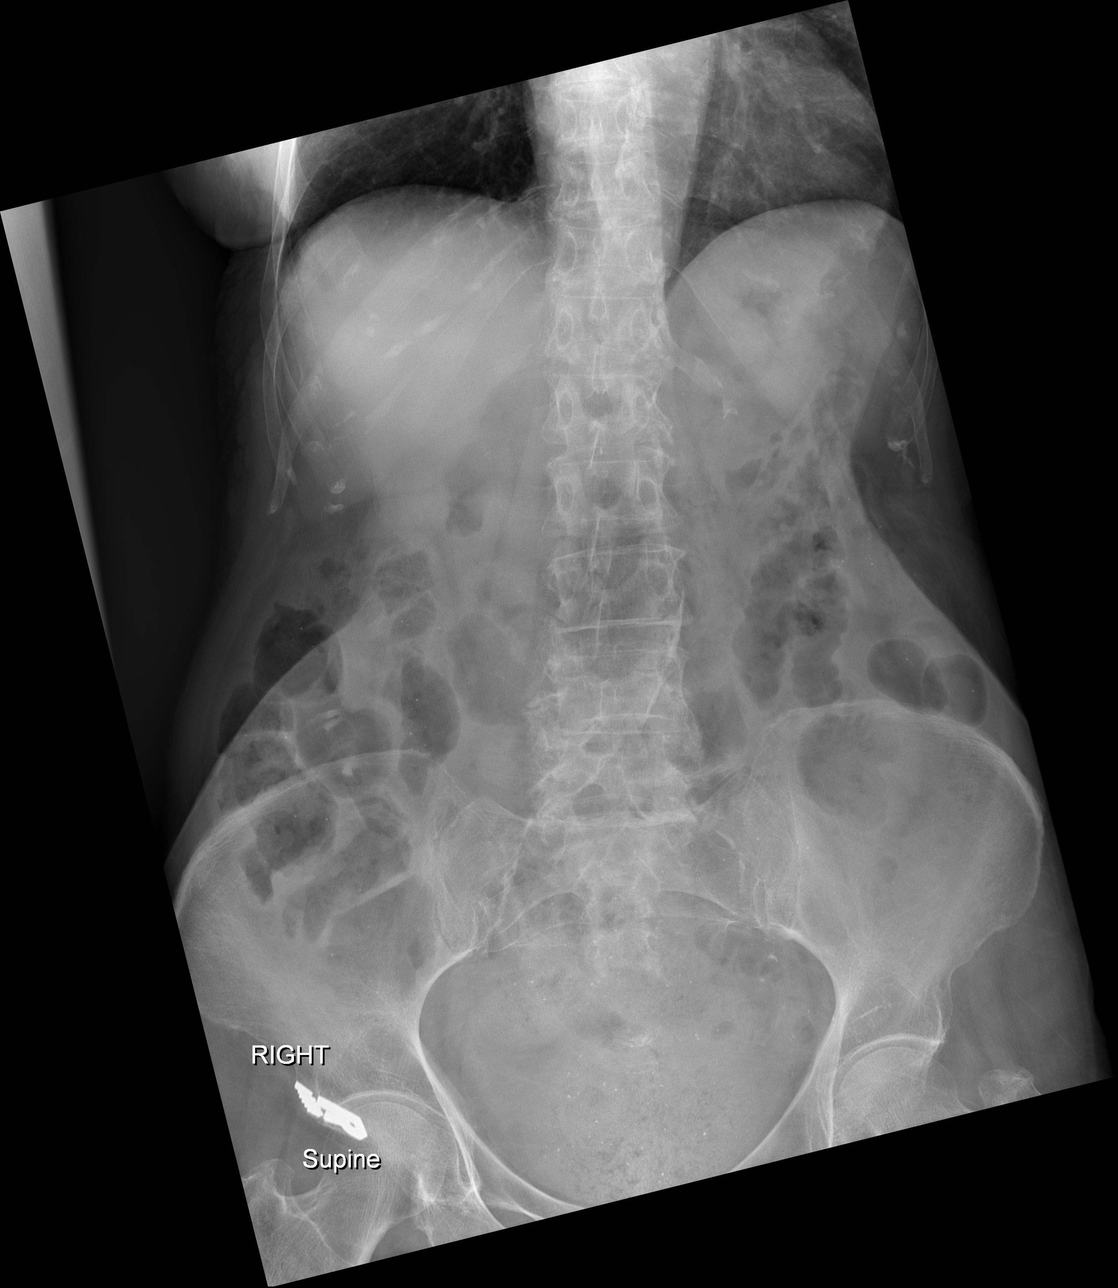

[x chest ap]
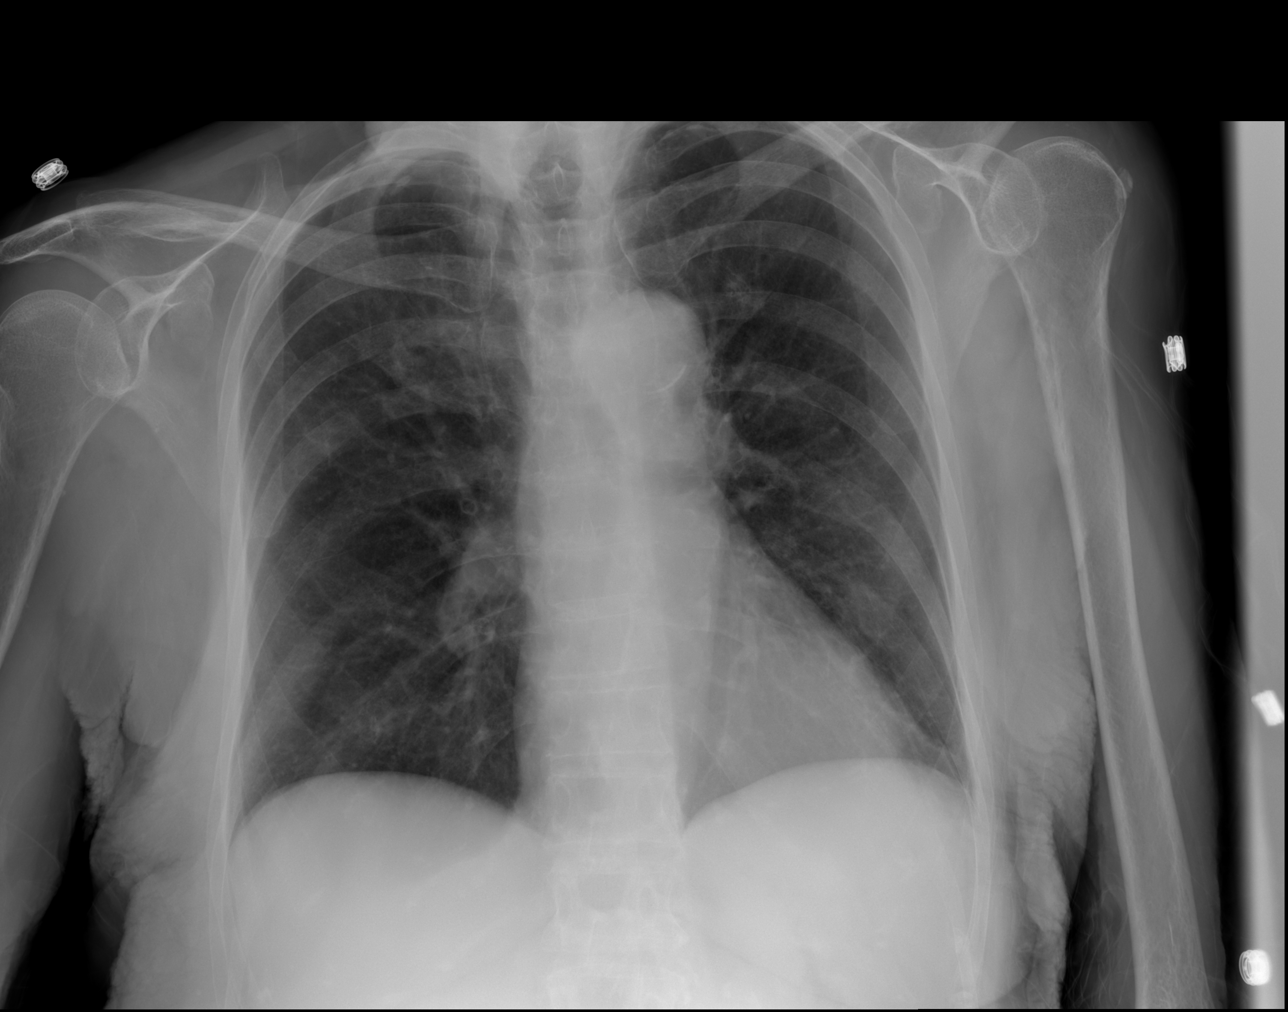

[3 of 3 positions shown; findings below may reference images not displayed]

FINDINGS: The heart, hila, mediastinum, lungs, and pleura demonstrate no acute
abnormalities.

No free air identified. No portal venous gas or pneumatosis. No
bowel obstruction is seen. Fecal loading in the rectum.
IMPRESSION: Fecal loading in the rectum. No bowel obstruction or acute
abnormality.

## 2017-10-10 ENCOUNTER — Other Ambulatory Visit: Payer: Self-pay | Admitting: Rheumatology

## 2017-10-10 NOTE — Telephone Encounter (Signed)
Last Visit: 07/24/17 Next Visit: 01/24/18  Okay to refill per Dr. Corliss Skainseveshwar

## 2018-01-11 NOTE — Progress Notes (Deleted)
Office Visit Note  Patient: Vicki Hernandez             Date of Birth: 01/09/1929           MRN: [id number]             PCP: Daisy Florooss, Charles Alan, MD Referring: Daisy Florooss, Charles Alan, MD Visit Date: 01/24/2018 Occupation: @GUAROCC @  Subjective:  No chief complaint on file.   History of Present Illness: Vicki Hernandez is a 84 y.o. female ***   Activities of Daily Living:  Patient reports morning stiffness for *** {minute/hour:19697}.   Patient {ACTIONS;DENIES/REPORTS:21021675::"Denies"} nocturnal pain.  Difficulty dressing/grooming: {ACTIONS;DENIES/REPORTS:21021675::"Denies"} Difficulty climbing stairs: {ACTIONS;DENIES/REPORTS:21021675::"Denies"} Difficulty getting out of chair: {ACTIONS;DENIES/REPORTS:21021675::"Denies"} Difficulty using hands for taps, buttons, cutlery, and/or writing: {ACTIONS;DENIES/REPORTS:21021675::"Denies"}  No Rheumatology ROS completed.   PMFS History:  Patient Active Problem List   Diagnosis Date Noted  . Primary osteoarthritis of both hands 06/07/2016  . Primary osteoarthritis of both knees 06/07/2016  . DDD (degenerative disc disease), cervical 06/07/2016  . DDD (degenerative disc disease), lumbar 06/07/2016  . History of macular degeneration 06/07/2016  . History of total left knee replacement (TKR) 06/07/2016  . S/P cervical spinal fusion 06/07/2016  . History of hypertension 03/30/2016  . History of gastroesophageal reflux (GERD) 03/30/2016  . Age-related osteoporosis without current pathological fracture 03/30/2016  . SVT (supraventricular tachycardia) (HCC) 11/29/2012  . Essential hypertension, benign 11/29/2012  . Tobacco use disorder 11/29/2012  . PVD (peripheral vascular disease) (HCC) 11/29/2012  . Aftercare following surgery of the circulatory system, NEC 10/18/2011  . Other symptoms involving cardiovascular system 04/05/2011  . Occlusion and stenosis of carotid artery 03/21/2011    Past Medical History:  Diagnosis Date  .  Accessory kidney   . Angina    3 WEEKS  . Arthritis    OSTEO   . Atrial fibrillation (HCC) 2014  . Carotid artery bruit   . Complication of anesthesia    TAKES AWHILE TO WAKE UP   . Dysrhythmia    TACHYCARDIA   . GERD (gastroesophageal reflux disease)   . H/O hiatal hernia   . Heart murmur   . Hyperlipidemia   . Hypertension   . Macular degeneration     Family History  Problem Relation Age of Onset  . Hyperlipidemia Father   . Hypertension Father   . Heart disease Father        Heart Disease before age 82-  Bleeding problems  . Heart attack Father   . Stroke Mother   . Heart disease Mother        After age 82   Past Surgical History:  Procedure Laterality Date  . ABDOMINAL HYSTERECTOMY  1974   TAH w/ BSO  . APPENDECTOMY    . CARDIOVASCULAR STRESS TEST     5 YRS AGO     DR H SMITH  (NOW SEES DR Eldridge DaceVARANASI)  . CAROTID ENDARTERECTOMY Left 03-25-11   cea  . CATARACT EXTRACTION W/ INTRAOCULAR LENS  IMPLANT, BILATERAL    . ENDARTERECTOMY  03/25/2011   Procedure: ENDARTERECTOMY CAROTID;  Surgeon: Pryor OchoaJames D Lawson, MD;  [facility];  Service: Vascular;  Laterality: Left;  Left Carotid Endarterectomy with dacron patch angioplasty, with resection of redundant common carotid with primary reanastamosis  . EYE SURGERY    . HEMORRHOID SURGERY    . JOINT REPLACEMENT  2012   Left Knee  . KNEE SURGERY    . SPINE SURGERY  2002, 2010   Lumbar disk/ spine  surgeries X 2   By Dr. Shon BatonBrooks  . TONSILLECTOMY     Social History   Social History Narrative  . Not on file    Objective: Vital Signs: There were no vitals taken for this visit.   Physical Exam   Musculoskeletal Exam: ***  CDAI Exam: CDAI Score: Not documented Patient Global Assessment: Not documented; Provider Global Assessment: Not documented Swollen: Not documented; Tender: Not documented Joint Exam   Not documented   There is currently no information documented on the homunculus. Go to the Rheumatology activity and  complete the homunculus joint exam.  Investigation: No additional findings.  Imaging: No results found.  Recent Labs: Lab Results  Component Value Date   WBC 10.0 05/29/2016   HGB 12.5 05/29/2016   PLT 197 05/29/2016   NA 139 05/29/2016   K 3.6 05/29/2016   CL 102 05/29/2016   CO2 27 05/29/2016   GLUCOSE 101 (H) 05/29/2016   BUN 17 05/29/2016   CREATININE 1.11 (H) 05/29/2016   BILITOT 0.7 05/29/2016   ALKPHOS 55 05/29/2016   AST 20 05/29/2016   ALT 12 (L) 05/29/2016   PROT 7.0 05/29/2016   ALBUMIN 3.9 05/29/2016   CALCIUM 9.2 05/29/2016   GFRAA 51 (L) 05/29/2016    Speciality Comments: No specialty comments available.  Procedures:  No procedures performed Allergies: Sulfa antibiotics; Codeine; and Cymbalta [duloxetine hcl]   Assessment / Plan:     Visit Diagnoses: No diagnosis found.   Orders: No orders of the defined types were placed in this encounter.  No orders of the defined types were placed in this encounter.   Face-to-face time spent with patient was *** minutes. Greater than 50% of time was spent in counseling and coordination of care.  Follow-Up Instructions: No follow-ups on file.   Ellen HenriMarissa C Dosha Broshears, CMA  Note - This record has been created using Animal nutritionistDragon software.  Chart creation errors have been sought, but may not always  have been located. Such creation errors do not reflect on  the standard of medical care.

## 2018-01-23 ENCOUNTER — Encounter: Payer: Self-pay | Admitting: Family

## 2018-01-23 ENCOUNTER — Ambulatory Visit (HOSPITAL_COMMUNITY)
Admission: RE | Admit: 2018-01-23 | Discharge: 2018-01-23 | Disposition: A | Discharge status: Home / Self Care | Payer: Medicare Other | Source: Ambulatory Visit | Attending: Family | Admitting: Family

## 2018-01-23 ENCOUNTER — Other Ambulatory Visit: Payer: Self-pay

## 2018-01-23 ENCOUNTER — Ambulatory Visit (INDEPENDENT_AMBULATORY_CARE_PROVIDER_SITE_OTHER): Payer: Medicare Other | Admitting: Family

## 2018-01-23 VITALS — BP 117/62 | HR 66 | Temp 97.7°F | Resp 14 | Ht 62.0 in | Wt 133.0 lb

## 2018-01-23 DIAGNOSIS — Z87891 Personal history of nicotine dependence: Secondary | ICD-10-CM | POA: Insufficient documentation

## 2018-01-23 DIAGNOSIS — I6522 Occlusion and stenosis of left carotid artery: Secondary | ICD-10-CM | POA: Diagnosis present

## 2018-01-23 DIAGNOSIS — Z9889 Other specified postprocedural states: Secondary | ICD-10-CM

## 2018-01-23 NOTE — Patient Instructions (Signed)

## 2018-01-23 NOTE — Progress Notes (Signed)
Chief Complaint: Follow up Extracranial Carotid Artery Stenosis   History of Present Illness  Vicki Hernandez is a 85 y.o. female who is s/p left carotid endarterectomy in March of 2013 by Dr. Hart RochesterLawson.  She returns today for follow up.  She denies any history of stroke or TIA. Specifically she denies a history of amaurosis fugax or monocular blindness, unilateral facial drooping, hemiplegia/hemiparesis, or receptive or expressive aphasia.  She denies pain, tingling, numbness, or weakness in arms or hands.   She denies claudication type symptoms with walking, denies non healing wounds.   Today she denies chest pain or dyspnea.   She stays physically active.   Diabetic: No Tobacco use: former smoker, quit 12-28-15,  started at age 85 yrs)  Pt meds include: Statin : Yes ASA: no, pt states Dr. Waverly FerrariVaranasis advised her to stop, pt does not know why; pt denies any bleeding issues Other anticoagulants/antiplatelets: no     Past Medical History:  Diagnosis Date  . Accessory kidney   . Angina    3 WEEKS  . Arthritis    OSTEO   . Atrial fibrillation (HCC) 2014  . Carotid artery bruit   . Complication of anesthesia    TAKES AWHILE TO WAKE UP   . Dysrhythmia    TACHYCARDIA   . GERD (gastroesophageal reflux disease)   . H/O hiatal hernia   . Heart murmur   . Hyperlipidemia   . Hypertension   . Macular degeneration     Social History Social History   Tobacco Use  . Smoking status: Former Smoker    Packs/day: 0.50    Types: Cigarettes    Last attempt to quit: 12/28/2015    Years since quitting: 2.0  . Smokeless tobacco: Never Used  Substance Use Topics  . Alcohol use: No    Frequency: Never    Comment: social  . Drug use: No    Family History Family History  Problem Relation Age of Onset  . Hyperlipidemia Father   . Hypertension Father   . Heart disease Father        Heart Disease before age 83-  Bleeding problems  . Heart attack Father   . Stroke  Mother   . Heart disease Mother        After age 83    Surgical History Past Surgical History:  Procedure Laterality Date  . ABDOMINAL HYSTERECTOMY  1974   TAH w/ BSO  . APPENDECTOMY    . CARDIOVASCULAR STRESS TEST     5 YRS AGO     DR H SMITH  (NOW SEES DR Eldridge DaceVARANASI)  . CAROTID ENDARTERECTOMY Left 03-25-11   cea  . CATARACT EXTRACTION W/ INTRAOCULAR LENS  IMPLANT, BILATERAL    . ENDARTERECTOMY  03/25/2011   Procedure: ENDARTERECTOMY CAROTID;  Surgeon: Pryor OchoaJames D Lawson, MD;  [facility];  Service: Vascular;  Laterality: Left;  Left Carotid Endarterectomy with dacron patch angioplasty, with resection of redundant common carotid with primary reanastamosis  . EYE SURGERY    . HEMORRHOID SURGERY    . JOINT REPLACEMENT  2012   Left Knee  . KNEE SURGERY    . SPINE SURGERY  2002, 2010   Lumbar disk/ spine surgeries X 2   By Dr. Shon BatonBrooks  . TONSILLECTOMY      Allergies  Allergen Reactions  . Sulfa Antibiotics Itching  . Codeine Nausea Only  . Cymbalta [Duloxetine Hcl]     Current Outpatient Medications  Medication Sig Dispense Refill  .  diclofenac sodium (VOLTAREN) 1 % GEL APPLY 3 GRAMS TO 3 LARGE JOINTS UP TO 3 TIMES A DAY AS NEEDED 300 g 0  . esomeprazole (NEXIUM) 20 MG capsule Take 20 mg by mouth daily at 12 noon.    . IBUPROFEN PO Take by mouth as needed.    Marland Kitchen losartan-hydrochlorothiazide (HYZAAR) 50-12.5 MG per tablet Take 1 tablet by mouth every morning.     Marland Kitchen LYRICA 50 MG capsule Take 50 mg by mouth daily.  1  . metoprolol tartrate (LOPRESSOR) 25 MG tablet TAKE 1/2 TABLET TWICE DAILY, MAY TAKE AN EXTRA 1 TABLET AS NEEDED FOR PALPITATIONS 30 tablet 4  . Multiple Vitamins-Minerals (EYE-VITES PO) Take 1 tablet by mouth daily.     Bertram Gala Glycol-Propyl Glycol (SYSTANE OP) Place 1-2 drops into both eyes daily as needed. For dry eyes    . polyethylene glycol (MIRALAX / GLYCOLAX) packet Take 17 g by mouth daily as needed for mild constipation or moderate constipation.    .  simvastatin (ZOCOR) 40 MG tablet Take 40 mg by mouth every evening.    . SYMBICORT 160-4.5 MCG/ACT inhaler     . VITAMIN D, ERGOCALCIFEROL, PO Take 1 capsule by mouth every morning.     . nitroGLYCERIN (NITROSTAT) 0.4 MG SL tablet Place 1 tablet (0.4 mg total) under the tongue every 5 (five) minutes as needed for chest pain. 25 tablet 3   No current facility-administered medications for this visit.     Review of Systems : See HPI for pertinent positives and negatives.  Physical Examination  Vitals:   01/23/18 1345 01/23/18 1350  BP: 114/62 117/62  Pulse: 67 66  Resp: 14   Temp: 97.7 F (36.5 C)   TempSrc: Oral   SpO2: 96%   Weight: 133 lb (60.3 kg)   Height: 5\' 2"  (1.575 m)    Body mass index is 24.33 kg/m.  General: WDWN female in NAD GAIT: normal Eyes: Right pupil is larger than left (pt states her ophthalmologist is aware of this) HENT: No gross abnormalities.  Pulmonary:  Respirations are non-labored, fair air movement in all fields, no rales, rhonchi, or wheezing. Cardiac: regular rhythm, + murmur.  VASCULAR EXAM Carotid Bruits Right Left   Transmitted cardiac murmur Transmitted cardiac murmur     Abdominal aortic pulse is not palpable. Radial pulses are 1+ palpable and equal.                                                                                                                            LE Pulses Right Left       POPLITEAL  not palpable   not palpable       POSTERIOR TIBIAL  faintly palpable   not palpable        DORSALIS PEDIS      ANTERIOR TIBIAL 1+ palpable  1+ palpable     Gastrointestinal: soft, nontender, BS WNL, no r/g, no palpable masses. Musculoskeletal: no  muscle atrophy/wasting. M/S 4/5 throughout, extremities without ischemic changes. Arthritic changes in hands. Moderate cervical and thoracic kyphosis.  Skin: No rashes, no ulcers, no cellulitis.   Neurologic:  A&O X 3; appropriate affect, sensation is normal; speech is normal, CN  2-12 intact, pain and light touch intact in extremities, motor exam as listed above. Psychiatric: Normal thought content, mood appropriate to clinical situation.    Assessment: Vicki Hernandez is a 85 y.o. female who is s/p left carotid endarterectomy performed in March of 2013. She denies any history of stroke or TIA.   Fortunately she does not have DM and quit smoking in December 2017.  Her atherosclerotic risk factors include former smoker, dyslipidemia, hypertension, and advanced age.  She takes a daily statin, is not taking an antiplatelet medication.   She states that she is doing well.    DATA Carotid Duplex (01-23-18): Right ICA: 1-39% stenosis Left ICA: CEA site with no restenosis Bilateral vertebral artery flow is antegrade.  Left subclavian artery waveforms are normal, right are disturbed. No significant change compared to the exams on 07-06-15 and 07-12-16.    Plan: Follow-up in 18 months with Carotid Duplex scan.    I discussed in depth with the patient the nature of atherosclerosis, and emphasized the importance of maximal medical management including strict control of blood pressure, blood glucose, and lipid levels, obtaining regular exercise, and continued cessation of smoking.  The patient is aware that without maximal medical management the underlying atherosclerotic disease process will progress, limiting the benefit of any interventions. The patient was given information about stroke prevention and what symptoms should prompt the patient to seek immediate medical care. Thank you for allowing us to participate in this patient's care.  Charisse MarchSuzanne Zenola Dezarn, RN, MSN, FNP-C [facility] Office: [phone]  Clinic Physician: Bonnye Favaarly, [place]  01/23/18 2:09 PM

## 2018-01-24 ENCOUNTER — Ambulatory Visit: Payer: Medicare Other | Admitting: Rheumatology

## 2018-01-25 ENCOUNTER — Telehealth: Payer: Self-pay

## 2018-01-25 MED ORDER — ASPIRIN EC 81 MG PO TBEC: 81.0000 mg | DELAYED_RELEASE_TABLET | Freq: Every day | ORAL | 3 refills | Status: AC

## 2018-01-25 NOTE — Progress Notes (Signed)
Called and made patient aware that she should restart her ASA 81 mg QD as long as she is not having any bleeding issues. Patient denies any S/Sx of bleeding. Med list updated.

## 2018-01-25 NOTE — Telephone Encounter (Signed)
-----   Message from Corky Crafts, MD sent at 01/24/2018  6:13 PM EST ----- I checked my last note.  She was on aspirin in Jan 2019 and I did not document that she should stop it.  In the absence of bleeding issues, I would continue ASA 81 mg daily.   JV ----- Message ----- From: Annye Rusk, NP Sent: 01/23/2018   6:40 PM EST To: Corky Crafts, MD, #

## 2018-01-25 NOTE — Telephone Encounter (Signed)
Called and made patient aware that she should restart ASA 81 mg QD unless she is having bleeding issues. Patient denies having any S/Sx of bleeding. Med list updated and patient verbalized understanding.

## 2018-03-07 NOTE — Progress Notes (Deleted)
Cardiology Office Note   Date:  03/07/2018   ID:  Vicki Hernandez, DOB [birth year], MRN [id number]  PCP:  Daisy Floro, MD    No chief complaint on file.    Wt Readings from Last 3 Encounters:  01/23/18 133 lb (60.3 kg)  07/24/17 141 lb 8 oz (64.2 kg)  02/15/17 135 lb 6.4 oz (61.4 kg)       History of Present Illness: Vicki Hernandez is a 85 y.o. female  who has had CEA, after having a knot and bruit noted in 2013. She had a rapid pulse in 2014 while at the vascular surgeon. Sx. resolved prior to ECG. She wore a monitor in 2014. She did not have any palpitations at that time while wearing the monitor. No arrhythmia noted at that time. She has been taken to the hospital years ago for this. She has used a beta blocker as needed which quickly resolves her symptoms.   2014 echo showed Normal LV function, HOCM anatomy.  Several years ago, she moved to [facility] and has to walk more to get to the dining hall.No GERD sx with walking. GERD seems to be the trigger for her SVT. She has had GERD but this decreased by eating more salads.   SVTQuickly resolves with metoprolol in the past. SHe did not want to increase the dose at the last visit.    She stopped smoking cold [place] a few years ago.    Past Medical History:  Diagnosis Date  . Accessory kidney   . Angina    3 WEEKS  . Arthritis    OSTEO   . Atrial fibrillation (HCC) 2014  . Carotid artery bruit   . Complication of anesthesia    TAKES AWHILE TO WAKE UP   . Dysrhythmia    TACHYCARDIA   . GERD (gastroesophageal reflux disease)   . H/O hiatal hernia   . Heart murmur   . Hyperlipidemia   . Hypertension   . Macular degeneration     Past Surgical History:  Procedure Laterality Date  . ABDOMINAL HYSTERECTOMY  1974   TAH w/ BSO  . APPENDECTOMY    . CARDIOVASCULAR STRESS TEST     5 YRS AGO     DR H SMITH  (NOW SEES DR Eldridge Dace)  . CAROTID ENDARTERECTOMY Left 03-25-11   cea  . CATARACT  EXTRACTION W/ INTRAOCULAR LENS  IMPLANT, BILATERAL    . ENDARTERECTOMY  03/25/2011   Procedure: ENDARTERECTOMY CAROTID;  Surgeon: Pryor Ochoa, MD;  Location: [facility] OR;  Service: Vascular;  Laterality: Left;  Left Carotid Endarterectomy with dacron patch angioplasty, with resection of redundant common carotid with primary reanastamosis  . EYE SURGERY    . HEMORRHOID SURGERY    . JOINT REPLACEMENT  2012   Left Knee  . KNEE SURGERY    . SPINE SURGERY  2002, 2010   Lumbar disk/ spine surgeries X 2   By Dr. Shon Baton  . TONSILLECTOMY       Current Outpatient Medications  Medication Sig Dispense Refill  . aspirin EC 81 MG tablet Take 1 tablet (81 mg total) by mouth daily. 90 tablet 3  . diclofenac sodium (VOLTAREN) 1 % GEL APPLY 3 GRAMS TO 3 LARGE JOINTS UP TO 3 TIMES A DAY AS NEEDED 300 g 0  . esomeprazole (NEXIUM) 20 MG capsule Take 20 mg by mouth daily at 12 noon.    . IBUPROFEN PO Take by mouth as needed.    Marland Kitchen  losartan-hydrochlorothiazide (HYZAAR) 50-12.5 MG per tablet Take 1 tablet by mouth every morning.     Marland Kitchen LYRICA 50 MG capsule Take 50 mg by mouth daily.  1  . metoprolol tartrate (LOPRESSOR) 25 MG tablet TAKE 1/2 TABLET TWICE DAILY, MAY TAKE AN EXTRA 1 TABLET AS NEEDED FOR PALPITATIONS 30 tablet 4  . Multiple Vitamins-Minerals (EYE-VITES PO) Take 1 tablet by mouth daily.     . nitroGLYCERIN (NITROSTAT) 0.4 MG SL tablet Place 1 tablet (0.4 mg total) under the tongue every 5 (five) minutes as needed for chest pain. 25 tablet 3  . Polyethyl Glycol-Propyl Glycol (SYSTANE OP) Place 1-2 drops into both eyes daily as needed. For dry eyes    . polyethylene glycol (MIRALAX / GLYCOLAX) packet Take 17 g by mouth daily as needed for mild constipation or moderate constipation.    . simvastatin (ZOCOR) 40 MG tablet Take 40 mg by mouth every evening.    . SYMBICORT 160-4.5 MCG/ACT inhaler     . VITAMIN D, ERGOCALCIFEROL, PO Take 1 capsule by mouth every morning.      No current facility-administered  medications for this visit.     Allergies:   Sulfa antibiotics; Codeine; and Cymbalta [duloxetine hcl]    Social History:  The patient  reports that she quit smoking about 2 years ago. Her smoking use included cigarettes. She smoked 0.50 packs per day. She has never used smokeless tobacco. She reports that she does not drink alcohol or use drugs.   Family History:  The patient's ***family history includes Heart attack in her father; Heart disease in her father and mother; Hyperlipidemia in her father; Hypertension in her father; Stroke in her mother.    ROS:  Please see the history of present illness.   Otherwise, review of systems are positive for ***.   All other systems are reviewed and negative.    PHYSICAL EXAM: VS:  There were no vitals taken for this visit. , BMI There is no height or weight on file to calculate BMI. GEN: Well nourished, well developed, in no acute distress  HEENT: normal  Neck: no JVD, carotid bruits, or masses Cardiac: ***RRR; no murmurs, rubs, or gallops,no edema  Respiratory:  clear to auscultation bilaterally, normal work of breathing GI: soft, nontender, nondistended, + BS MS: no deformity or atrophy  Skin: warm and dry, no rash Neuro:  Strength and sensation are intact Psych: euthymic mood, full affect   EKG:   The ekg ordered today demonstrates ***   Recent Labs: No results found for requested labs within last 8760 hours.   Lipid Panel No results found for: CHOL, TRIG, HDL, CHOLHDL, VLDL, LDLCALC, LDLDIRECT   Other studies Reviewed: Additional studies/ records that were reviewed today with results demonstrating: patent carotid bilaterally in Jan 2020 by Doppler.   ASSESSMENT AND PLAN:  1. SVT:  2. HTN: 3. PAD: h/o CEA.   Current medicines are reviewed at length with the patient today.  The patient concerns regarding her medicines were addressed.  The following changes have been made:  No change***  Labs/ tests ordered today include:  *** No orders of the defined types were placed in this encounter.   Recommend 150 minutes/week of aerobic exercise Low fat, low carb, high fiber diet recommended  Disposition:   FU in ***   Signed, Lance Muss, MD  03/07/2018 9:49 AM    [facility] [address] [place], [place], [place]  [address] Phone: [phone]; Fax: [phone]   

## 2018-03-08 ENCOUNTER — Ambulatory Visit: Payer: Medicare Other | Admitting: Interventional Cardiology

## 2018-03-09 ENCOUNTER — Encounter: Payer: Self-pay | Admitting: Interventional Cardiology

## 2018-03-16 NOTE — Progress Notes (Signed)
Cardiology Office Note   Date:  03/19/2018   ID:  Vicki Hernandez, DOB May 14, 1928, MRN [id number]  PCP:  Daisy Floro, MD    No chief complaint on file.  SVT  Wt Readings from Last 3 Encounters:  03/19/18 134 lb 6.4 oz (61 kg)  01/23/18 133 lb (60.3 kg)  07/24/17 141 lb 8 oz (64.2 kg)       History of Present Illness: Vicki Hernandez is a 85 y.o. female  who has had CEA, after having a knot and bruit noted in 2013. She had a rapid pulse in 2014 while at the vascular surgeon. Sx. resolved prior to ECG. She wore a monitor in 2014. She did not have any palpitations at that time while wearing the monitor. No arrhythmia noted at that time. She has been taken to the hospital years ago for this. She has used a beta blocker as needed which quickly resolves her symptoms.   2014 echo showed Normal LV function, HOCM anatomy.  Several years ago, she moved to [facility] and has to walk more to get to the dining hall.No GERD sx with walking. GERD seems to be the trigger for her SVT. She has had GERD but this decreased by eating more salads.   SVTQuickly resolves with metoprolol in the past. SHe did not want to increase the dose at the last visit.    She stopped smoking cold [place] a few years ago.  Denies : Chest pain. Dizziness. Leg edema. Nitroglycerin use. Orthopnea. Paroxysmal nocturnal dyspnea. Shortness of breath. Syncope.   She does still have palpitations about 1x/month that resolve with metoprolol.    Past Medical History:  Diagnosis Date  . Accessory kidney   . Angina    3 WEEKS  . Arthritis    OSTEO   . Atrial fibrillation (HCC) 2014  . Carotid artery bruit   . Complication of anesthesia    TAKES AWHILE TO WAKE UP   . Dysrhythmia    TACHYCARDIA   . GERD (gastroesophageal reflux disease)   . H/O hiatal hernia   . Heart murmur   . Hyperlipidemia   . Hypertension   . Macular degeneration     Past Surgical History:  Procedure Laterality  Date  . ABDOMINAL HYSTERECTOMY  1974   TAH w/ BSO  . APPENDECTOMY    . CARDIOVASCULAR STRESS TEST     5 YRS AGO     DR H SMITH  (NOW SEES DR Eldridge Dace)  . CAROTID ENDARTERECTOMY Left 03-25-11   cea  . CATARACT EXTRACTION W/ INTRAOCULAR LENS  IMPLANT, BILATERAL    . ENDARTERECTOMY  03/25/2011   Procedure: ENDARTERECTOMY CAROTID;  Surgeon: Pryor Ochoa, MD;  Location: [facility] OR;  Service: Vascular;  Laterality: Left;  Left Carotid Endarterectomy with dacron patch angioplasty, with resection of redundant common carotid with primary reanastamosis  . EYE SURGERY    . HEMORRHOID SURGERY    . JOINT REPLACEMENT  2012   Left Knee  . KNEE SURGERY    . SPINE SURGERY  2002, 2010   Lumbar disk/ spine surgeries X 2   By Dr. Shon Baton  . TONSILLECTOMY       Current Outpatient Medications  Medication Sig Dispense Refill  . aspirin EC 81 MG tablet Take 1 tablet (81 mg total) by mouth daily. 90 tablet 3  . diclofenac sodium (VOLTAREN) 1 % GEL APPLY 3 GRAMS TO 3 LARGE JOINTS UP TO 3 TIMES A DAY AS NEEDED  300 g 0  . esomeprazole (NEXIUM) 20 MG capsule Take 20 mg by mouth daily at 12 noon.    . IBUPROFEN PO Take by mouth as needed.    Marland Kitchen losartan-hydrochlorothiazide (HYZAAR) 50-12.5 MG per tablet Take 1 tablet by mouth every morning.     . metoprolol tartrate (LOPRESSOR) 25 MG tablet TAKE 1/2 TABLET TWICE DAILY, MAY TAKE AN EXTRA 1 TABLET AS NEEDED FOR PALPITATIONS 30 tablet 4  . Multiple Vitamins-Minerals (EYE-VITES PO) Take 1 tablet by mouth daily.     Bertram Gala Glycol-Propyl Glycol (SYSTANE OP) Place 1-2 drops into both eyes daily as needed. For dry eyes    . polyethylene glycol (MIRALAX / GLYCOLAX) packet Take 17 g by mouth daily as needed for mild constipation or moderate constipation.    . simvastatin (ZOCOR) 40 MG tablet Take 40 mg by mouth every evening.    . SYMBICORT 160-4.5 MCG/ACT inhaler     . VITAMIN D, ERGOCALCIFEROL, PO Take 1 capsule by mouth every morning.     . nitroGLYCERIN (NITROSTAT) 0.4  MG SL tablet Place 1 tablet (0.4 mg total) under the tongue every 5 (five) minutes as needed for chest pain. 25 tablet 3   No current facility-administered medications for this visit.     Allergies:   Sulfa antibiotics; Codeine; and Cymbalta [duloxetine hcl]    Social History:  The patient  reports that she quit smoking about 2 years ago. Her smoking use included cigarettes. She smoked 0.50 packs per day. She has never used smokeless tobacco. She reports that she does not drink alcohol or use drugs.   Family History:  The patient's family history includes Heart attack in her father; Heart disease in her father and mother; Hyperlipidemia in her father; Hypertension in her father; Stroke in her mother.    ROS:  Please see the history of present illness.   Otherwise, review of systems are positive for intentional weight loss.   All other systems are reviewed and negative.    PHYSICAL EXAM: VS:  BP 136/70   Pulse 62   Ht 5\' 2"  (1.575 m)   Wt 134 lb 6.4 oz (61 kg)   SpO2 95%   BMI 24.58 kg/m  , BMI Body mass index is 24.58 kg/m. GEN: Well nourished, well developed, in no acute distress  HEENT: normal  Neck: no JVD, carotid bruits, or masses Cardiac: RRR; 2/6 systolic murmur, no rubs, or gallops,no edema  Respiratory:  clear to auscultation bilaterally, normal work of breathing GI: soft, nontender, nondistended, + BS MS: no deformity or atrophy  Skin: warm and dry, no rash Neuro:  Strength and sensation are intact Psych: euthymic mood, full affect   EKG:   The ekg ordered today demonstrates NSR, no ST changes   Recent Labs: No results found for requested labs within last 8760 hours.   Lipid Panel No results found for: CHOL, TRIG, HDL, CHOLHDL, VLDL, LDLCALC, LDLDIRECT   Other studies Reviewed: Additional studies/ records that were reviewed today with results demonstrating: patent carotid bilaterally in Jan 2020 by Doppler. Right Carotid: Velocities in the right ICA are  consistent with a 1-39% stenosis.                Non-hemodynamically significant plaque <50% noted in the CCA. The                ECA appears <50% stenosed.  Left Carotid: Non-hemodynamically significant plaque noted in the CCA. The ECA  appears <50% stenosed. Patent left carotid endarterectomy with               plaque formation without evidence of restenosis.  Vertebrals:  Bilateral vertebral arteries demonstrate antegrade flow. Subclavians: Right subclavian artery flow was disturbed. Normal flow              hemodynamics were seen in the left subclavian artery.  *See table(s) above for measurements and observations.     Electronically signed by Gretta Began MD on 01/23/2018 at 1:45:44 PM.   ASSESSMENT AND PLAN:  1. SVT: Sx controlled.  Continue beta-blocker 2. HTN: The current medical regimen is effective;  continue present plan and medications. 3. PAD: h/o CEA.  She stopped her simvastatin because her husband was taken off of his statin.  I recommended that she go back on her statin for preventive reasons.  4. Her husband is a patient of Dr. Katrinka Blazing we will get a message to him regarding his statin use. 5. 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  No sx. COntinue beta blocker.    Current medicines are reviewed at length with the patient today.  The patient concerns regarding her medicines were addressed.  The following changes have been made:  No change  Labs/ tests ordered today include:  No orders of the defined types were placed in this encounter.   Recommend 150 minutes/week of aerobic exercise Low fat, low carb, high fiber diet recommended  Disposition:   FU in 1 year   Signed, Lance Muss, MD  03/19/2018 9:21 AM    [facility] [address] [place], [place], [place]  [address] Phone: [phone]; Fax: [phone]

## 2018-03-19 ENCOUNTER — Encounter: Payer: Self-pay | Admitting: Interventional Cardiology

## 2018-03-19 ENCOUNTER — Ambulatory Visit: Payer: Medicare Other | Admitting: Interventional Cardiology

## 2018-03-19 VITALS — BP 136/70 | HR 62 | Ht 62.0 in | Wt 134.4 lb

## 2018-03-19 DIAGNOSIS — I471 Supraventricular tachycardia: Secondary | ICD-10-CM

## 2018-03-19 DIAGNOSIS — I10 Essential (primary) hypertension: Secondary | ICD-10-CM | POA: Diagnosis not present

## 2018-03-19 DIAGNOSIS — Z9889 Other specified postprocedural states: Secondary | ICD-10-CM | POA: Diagnosis not present

## 2018-03-19 DIAGNOSIS — I421 Obstructive hypertrophic cardiomyopathy: Secondary | ICD-10-CM | POA: Diagnosis not present

## 2018-03-19 DIAGNOSIS — Z87891 Personal history of nicotine dependence: Secondary | ICD-10-CM

## 2018-03-19 MED ORDER — SIMVASTATIN 40 MG PO TABS: 40.0000 mg | ORAL_TABLET | Freq: Every evening | ORAL | 3 refills | Status: DC

## 2018-03-19 NOTE — Patient Instructions (Signed)
Medication Instructions:  Your physician recommends that you continue on your current medications as directed. Please refer to the Current Medication list given to you today.   Restart your simvastatin  If you need a refill on your cardiac medications before your next appointment, please call your pharmacy.   Lab work: None Ordered  If you have labs (blood work) drawn today and your tests are completely normal, you will receive your results only by: Marland Kitchen MyChart Message (if you have MyChart) OR . A paper copy in the mail If you have any lab test that is abnormal or we need to change your treatment, we will call you to review the results.  Testing/Procedures: None ordered  Follow-Up: At [facility], you and your health needs are our priority.  As part of our continuing mission to provide you with exceptional heart care, we have created designated Provider Care Teams.  These Care Teams include your primary Cardiologist (physician) and Advanced Practice Providers (APPs -  Physician Assistants and Nurse Practitioners) who all work together to provide you with the care you need, when you need it. . You will need a follow up appointment in 1 year.  Please call our office 2 months in advance to schedule this appointment.  You may see Everette Rank, MD or one of the following Advanced Practice Providers on your designated Care Team:   . Robbie Lis, PA-C . Dayna Dunn, PA-C . Jacolyn Reedy, PA-C  Any Other Special Instructions Will Be Listed Below (If Applicable).

## 2018-03-22 ENCOUNTER — Telehealth: Payer: Self-pay | Admitting: *Deleted

## 2018-03-22 MED ORDER — DICLOFENAC SODIUM 1 % TD GEL: TRANSDERMAL | 0 refills | Status: DC

## 2018-03-22 NOTE — Telephone Encounter (Signed)
Last Visit: 07/24/17 Next Visit due January 2020. Message sent to the front to schedule patient.   Okay to refill per Dr. Corliss Skains

## 2018-03-22 NOTE — Telephone Encounter (Signed)
Please schedule patient for follow up visit. Patient was due January 2020. Thanks!

## 2018-03-23 NOTE — Progress Notes (Deleted)
Office Visit Note  Patient: Vicki Hernandez             Date of Birth: Feb 22, 1928           MRN: [id number]             PCP: Daisy Floro, MD Referring: Daisy Floro, MD Visit Date: 04/04/2018 Occupation: @GUAROCC @  Subjective:  No chief complaint on file.   History of Present Illness: Vicki Hernandez is a 85 y.o. female ***   Activities of Daily Living:  Patient reports morning stiffness for *** {minute/hour:19697}.   Patient {ACTIONS;DENIES/REPORTS:21021675::"Denies"} nocturnal pain.  Difficulty dressing/grooming: {ACTIONS;DENIES/REPORTS:21021675::"Denies"} Difficulty climbing stairs: {ACTIONS;DENIES/REPORTS:21021675::"Denies"} Difficulty getting out of chair: {ACTIONS;DENIES/REPORTS:21021675::"Denies"} Difficulty using hands for taps, buttons, cutlery, and/or writing: {ACTIONS;DENIES/REPORTS:21021675::"Denies"}  No Rheumatology ROS completed.   PMFS History:  Patient Active Problem List   Diagnosis Date Noted  . Primary osteoarthritis of both hands 06/07/2016  . Primary osteoarthritis of both knees 06/07/2016  . DDD (degenerative disc disease), cervical 06/07/2016  . DDD (degenerative disc disease), lumbar 06/07/2016  . History of macular degeneration 06/07/2016  . History of total left knee replacement (TKR) 06/07/2016  . S/P cervical spinal fusion 06/07/2016  . History of hypertension 03/30/2016  . History of gastroesophageal reflux (GERD) 03/30/2016  . Age-related osteoporosis without current pathological fracture 03/30/2016  . SVT (supraventricular tachycardia) (HCC) 11/29/2012  . Essential hypertension, benign 11/29/2012  . Tobacco use disorder 11/29/2012  . PVD (peripheral vascular disease) (HCC) 11/29/2012  . Aftercare following surgery of the circulatory system, NEC 10/18/2011  . Other symptoms involving cardiovascular system 04/05/2011  . Occlusion and stenosis of carotid artery 03/21/2011    Past Medical History:  Diagnosis Date  .  Accessory kidney   . Angina    3 WEEKS  . Arthritis    OSTEO   . Atrial fibrillation (HCC) 2014  . Carotid artery bruit   . Complication of anesthesia    TAKES AWHILE TO WAKE UP   . Dysrhythmia    TACHYCARDIA   . GERD (gastroesophageal reflux disease)   . H/O hiatal hernia   . Heart murmur   . Hyperlipidemia   . Hypertension   . Macular degeneration     Family History  Problem Relation Age of Onset  . Hyperlipidemia Father   . Hypertension Father   . Heart disease Father        Heart Disease before age 59-  Bleeding problems  . Heart attack Father   . Stroke Mother   . Heart disease Mother        After age 74   Past Surgical History:  Procedure Laterality Date  . ABDOMINAL HYSTERECTOMY  1974   TAH w/ BSO  . APPENDECTOMY    . CARDIOVASCULAR STRESS TEST     5 YRS AGO     DR H SMITH  (NOW SEES DR Eldridge Dace)  . CAROTID ENDARTERECTOMY Left 03-25-11   cea  . CATARACT EXTRACTION W/ INTRAOCULAR LENS  IMPLANT, BILATERAL    . ENDARTERECTOMY  03/25/2011   Procedure: ENDARTERECTOMY CAROTID;  Surgeon: Pryor Ochoa, MD;  Location: [facility] OR;  Service: Vascular;  Laterality: Left;  Left Carotid Endarterectomy with dacron patch angioplasty, with resection of redundant common carotid with primary reanastamosis  . EYE SURGERY    . HEMORRHOID SURGERY    . JOINT REPLACEMENT  2012   Left Knee  . KNEE SURGERY    . SPINE SURGERY  2002, 2010   Lumbar disk/ spine  surgeries X 2   By Dr. Shon Baton  . TONSILLECTOMY     Social History   Social History Narrative  . Not on file    There is no immunization history on file for this patient.   Objective: Vital Signs: There were no vitals taken for this visit.   Physical Exam   Musculoskeletal Exam: ***  CDAI Exam: CDAI Score: Not documented Patient Global Assessment: Not documented; Provider Global Assessment: Not documented Swollen: Not documented; Tender: Not documented Joint Exam   Not documented   There is currently no information  documented on the homunculus. Go to the Rheumatology activity and complete the homunculus joint exam.  Investigation: No additional findings.  Imaging: No results found.  Recent Labs: Lab Results  Component Value Date   WBC 10.0 05/29/2016   HGB 12.5 05/29/2016   PLT 197 05/29/2016   NA 139 05/29/2016   K 3.6 05/29/2016   CL 102 05/29/2016   CO2 27 05/29/2016   GLUCOSE 101 (H) 05/29/2016   BUN 17 05/29/2016   CREATININE 1.11 (H) 05/29/2016   BILITOT 0.7 05/29/2016   ALKPHOS 55 05/29/2016   AST 20 05/29/2016   ALT 12 (L) 05/29/2016   PROT 7.0 05/29/2016   ALBUMIN 3.9 05/29/2016   CALCIUM 9.2 05/29/2016   GFRAA 51 (L) 05/29/2016    Speciality Comments: No specialty comments available.  Procedures:  No procedures performed Allergies: Sulfa antibiotics; Codeine; and Cymbalta [duloxetine hcl]   Assessment / Plan:     Visit Diagnoses: No diagnosis found.   Orders: No orders of the defined types were placed in this encounter.  No orders of the defined types were placed in this encounter.   Face-to-face time spent with patient was *** minutes. Greater than 50% of time was spent in counseling and coordination of care.  Follow-Up Instructions: No follow-ups on file.   Ellen Henri, CMA  Note - This record has been created using Animal nutritionist.  Chart creation errors have been sought, but may not always  have been located. Such creation errors do not reflect on  the standard of medical care.

## 2018-04-04 ENCOUNTER — Ambulatory Visit: Payer: Medicare Other | Admitting: Rheumatology

## 2018-04-09 ENCOUNTER — Telehealth: Payer: Self-pay | Admitting: Rheumatology

## 2018-04-09 MED ORDER — DICLOFENAC SODIUM 1 % TD GEL: TRANSDERMAL | 0 refills | Status: DC

## 2018-04-09 NOTE — Telephone Encounter (Signed)
Last Visit: 07/24/2017 Next Visit: message sent to the front desk to schedule follow up.     Okay to refill per Dr. Corliss Skains.

## 2018-04-09 NOTE — Telephone Encounter (Signed)
Pharmacist from [facility] called requesting patient's prescription of Voltaren Gel be faxed to [phone].  If you have any questions, please call Larina Bras at #[phone]

## 2019-03-08 ENCOUNTER — Other Ambulatory Visit: Payer: Self-pay | Admitting: Rheumatology

## 2019-03-17 NOTE — Progress Notes (Signed)
Cardiology Office Note   Date:  03/19/2019   ID:  Vicki Hernandez, DOB 18-Mar-1928, MRN [id number]  PCP:  Lawerance Cruel, MD    No chief complaint on file.  SVT  Wt Readings from Last 3 Encounters:  03/19/19 134 lb 6.4 oz (61 kg)  03/19/18 134 lb 6.4 oz (61 kg)  01/23/18 133 lb (60.3 kg)       History of Present Illness: Vicki Hernandez is a 86 y.o. female  who has had CEA, after having a knot and bruit noted in 2013. She had a rapid pulse in 2014 while at the vascular surgeon. Sx. resolved prior to ECG. She wore a monitor in 2014. She did not have any palpitations at that time while wearing the monitor. No arrhythmia noted at that time. She has been taken to the hospital years ago for this. She has used a beta blocker as needed which quickly resolves her symptoms.   2014 echo showed Normal LV function, HOCM anatomy.  Several years ago, she moved to [facility] and has to walk more to get to the dining hall.No GERD sx with walking. GERD seems to be the trigger for her SVT, but decreased GERD by eating more salads.   SVTQuickly resolves with metoprololin the past. SHe didnot want to increase the dose in the past.    She stopped smoking cold turkeya few years ago.  Since the last visit, she has done well.   She is active taking care of her husband.  She walks outside when the weather is good.  Denies : Chest pain. Dizziness. Leg edema. Nitroglycerin use. Orthopnea. Palpitations. Paroxysmal nocturnal dyspnea. Shortness of breath. Syncope.     Past Medical History:  Diagnosis Date  . Accessory kidney   . Angina    3 WEEKS  . Arthritis    OSTEO   . Atrial fibrillation ([facility]) 2014  . Carotid artery bruit   . Complication of anesthesia    TAKES AWHILE TO WAKE UP   . Dysrhythmia    TACHYCARDIA   . GERD (gastroesophageal reflux disease)   . H/O hiatal hernia   . Heart murmur   . Hyperlipidemia   . Hypertension   . Macular degeneration      Past Surgical History:  Procedure Laterality Date  . ABDOMINAL HYSTERECTOMY  1974   TAH w/ BSO  . APPENDECTOMY    . CARDIOVASCULAR STRESS TEST     [place]  (NOW SEES DR Irish Lack)  . CAROTID ENDARTERECTOMY Left 03-25-11   cea  . CATARACT EXTRACTION W/ INTRAOCULAR LENS  IMPLANT, BILATERAL    . ENDARTERECTOMY  03/25/2011   Procedure: ENDARTERECTOMY CAROTID;  Surgeon: Mal Misty, MD;  Location: [facility] OR;  Service: Vascular;  Laterality: Left;  Left Carotid Endarterectomy with dacron patch angioplasty, with resection of redundant common carotid with primary reanastamosis  . EYE SURGERY    . HEMORRHOID SURGERY    . JOINT REPLACEMENT  2012   Left Knee  . KNEE SURGERY    . SPINE SURGERY  2002, 2010   Lumbar disk/ spine surgeries X 2   By Dr. Rolena Infante  . TONSILLECTOMY       Current Outpatient Medications  Medication Sig Dispense Refill  . aspirin EC 81 MG tablet Take 1 tablet (81 mg total) by mouth daily. 90 tablet 3  . esomeprazole (NEXIUM) 20 MG capsule Take 20 mg by mouth daily at  12 noon.    Marland Kitchen losartan-hydrochlorothiazide (HYZAAR) 50-12.5 MG per tablet Take 1 tablet by mouth every morning.     . metoprolol tartrate (LOPRESSOR) 25 MG tablet TAKE 1/2 TABLET TWICE DAILY, MAY TAKE AN EXTRA 1 TABLET AS NEEDED FOR PALPITATIONS 30 tablet 4  . Multiple Vitamins-Minerals (EYE-VITES PO) Take 1 tablet by mouth daily.     Bertram Gala Glycol-Propyl Glycol (SYSTANE OP) Place 1-2 drops into both eyes daily as needed. For dry eyes    . polyethylene glycol (MIRALAX / GLYCOLAX) packet Take 17 g by mouth daily as needed for mild constipation or moderate constipation.    . simvastatin (ZOCOR) 40 MG tablet Take 1 tablet (40 mg total) by mouth every evening. 90 tablet 3  . SYMBICORT 160-4.5 MCG/ACT inhaler     . VITAMIN D, ERGOCALCIFEROL, PO Take 1 capsule by mouth every morning.     . nitroGLYCERIN (NITROSTAT) 0.4 MG SL tablet Place 1 tablet (0.4 mg total) under the tongue every 5 (five)  minutes as needed for chest pain. 25 tablet 3   No current facility-administered medications for this visit.    Allergies:   Sulfa antibiotics, Codeine, and Cymbalta [duloxetine hcl]    Social History:  The patient  reports that she quit smoking about 3 years ago. Her smoking use included cigarettes. She smoked 0.50 packs per day. She has never used smokeless tobacco. She reports that she does not drink alcohol or use drugs.   Family History:  The patient's family history includes Heart attack in her father; Heart disease in her father and mother; Hyperlipidemia in her father; Hypertension in her father; Stroke in her mother.    ROS:  Please see the history of present illness.   Otherwise, review of systems are positive for mild dizziness with standing quickly.   All other systems are reviewed and negative.    PHYSICAL EXAM: VS:  BP 124/70   Pulse 69   Ht 5\' 2"  (1.575 m)   Wt 134 lb 6.4 oz (61 kg)   BMI 24.58 kg/m  , BMI Body mass index is 24.58 kg/m. GEN: Well nourished, well developed, in no acute distress  HEENT: normal  Neck: no JVD, carotid bruits, or masses Cardiac: RRR, premature beats; no murmurs, rubs, or gallops,no edema  Respiratory:  clear to auscultation bilaterally, normal work of breathing GI: soft, nontender, nondistended, + BS MS: no deformity or atrophy  Skin: warm and dry, no rash Neuro:  Strength and sensation are intact Psych: euthymic mood, full affect   EKG:   The ekg ordered today demonstrates NSR, PACs   Recent Labs: No results found for requested labs within last 8760 hours.   Lipid Panel No results found for: CHOL, TRIG, HDL, CHOLHDL, VLDL, LDLCALC, LDLDIRECT   Other studies Reviewed: Additional studies/ records that were reviewed today with results demonstrating: Labs from Dr. reviewed showing LDL 95, HDL 63 in December 2020.   ASSESSMENT AND PLAN:  1. SVT: Continue beta-blocker. Sx controlled.   2. Hypertension: The current  medical regimen is effective;  continue present plan and medications.  She wanted to come off of her antihypertensives but explained there are benefits in terms of stroke prevention with her ARB.  Stay well-hydrated.  She drinks more tea than water. 3. PAD: History of CVA.  She should be on a statin.  She stopped this on her own in the past but is back on the medicine.  She is due for a carotid Doppler  with [facility]. 4. 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  This has not been symptomatic in the past.  Beta-blocker treatment has been effective. 5. She received her COVID vaccine.    Current medicines are reviewed at length with the patient today.  The patient concerns regarding her medicines were addressed.  The following changes have been made:  No change  Labs/ tests ordered today include:  No orders of the defined types were placed in this encounter.   Recommend 150 minutes/week of aerobic exercise Low fat, low carb, high fiber diet recommended  Disposition:   FU in 1 year   Signed, Lance Muss, MD  03/19/2019 8:53 AM    [facility] [address] [place], [place], [place]  [address] Phone: [phone]; Fax: [phone]

## 2019-03-19 ENCOUNTER — Other Ambulatory Visit: Payer: Self-pay

## 2019-03-19 ENCOUNTER — Ambulatory Visit: Payer: Medicare Other | Admitting: Interventional Cardiology

## 2019-03-19 ENCOUNTER — Encounter: Payer: Self-pay | Admitting: Interventional Cardiology

## 2019-03-19 VITALS — BP 124/70 | HR 69 | Ht 62.0 in | Wt 134.4 lb

## 2019-03-19 DIAGNOSIS — Z9889 Other specified postprocedural states: Secondary | ICD-10-CM

## 2019-03-19 DIAGNOSIS — I421 Obstructive hypertrophic cardiomyopathy: Secondary | ICD-10-CM

## 2019-03-19 DIAGNOSIS — I471 Supraventricular tachycardia: Secondary | ICD-10-CM

## 2019-03-19 DIAGNOSIS — I10 Essential (primary) hypertension: Secondary | ICD-10-CM

## 2019-03-19 NOTE — Patient Instructions (Signed)
Medication Instructions:  Your physician recommends that you continue on your current medications as directed. Please refer to the Current Medication list given to you today.  *If you need a refill on your cardiac medications before your next appointment, please call your pharmacy*   Lab Work: None ordered  If you have labs (blood work) drawn today and your tests are completely normal, you will receive your results only by: . MyChart Message (if you have MyChart) OR . A paper copy in the mail If you have any lab test that is abnormal or we need to change your treatment, we will call you to review the results.   Testing/Procedures: None ordered   Follow-Up: At [facility], you and your health needs are our priority.  As part of our continuing mission to provide you with exceptional heart care, we have created designated Provider Care Teams.  These Care Teams include your primary Cardiologist (physician) and Advanced Practice Providers (APPs -  Physician Assistants and Nurse Practitioners) who all work together to provide you with the care you need, when you need it.  We recommend signing up for the patient portal called "MyChart".  Sign up information is provided on this After Visit Summary.  MyChart is used to connect with patients for Virtual Visits (Telemedicine).  Patients are able to view lab/test results, encounter notes, upcoming appointments, etc.  Non-urgent messages can be sent to your provider as well.   To learn more about what you can do with MyChart, go to [email or web address].    Your next appointment:   12 month(s)  The format for your next appointment:   In Person  Provider:   You may see Jay Varanasi, MD or one of the following Advanced Practice Providers on your designated Care Team:    Dayna Dunn, PA-C  Michele Lenze, PA-C    Other Instructions   

## 2019-04-19 ENCOUNTER — Telehealth: Payer: Self-pay | Admitting: Interventional Cardiology

## 2019-04-19 NOTE — Telephone Encounter (Signed)
Left message for Vicki Hernandez to give our office a call back.

## 2019-04-19 NOTE — Telephone Encounter (Signed)
Vicki Hernandez with [facility] called back. We updated patient's medication list to reflect what patient is currently taking.

## 2019-04-19 NOTE — Telephone Encounter (Signed)
New message     Calling to verify patient is on losartan.  This medication is not on their med list.

## 2020-01-23 DIAGNOSIS — K219 Gastro-esophageal reflux disease without esophagitis: Secondary | ICD-10-CM | POA: Diagnosis not present

## 2020-01-23 DIAGNOSIS — N183 Chronic kidney disease, stage 3 unspecified: Secondary | ICD-10-CM | POA: Diagnosis not present

## 2020-01-23 DIAGNOSIS — M1990 Unspecified osteoarthritis, unspecified site: Secondary | ICD-10-CM | POA: Diagnosis not present

## 2020-01-23 DIAGNOSIS — J449 Chronic obstructive pulmonary disease, unspecified: Secondary | ICD-10-CM | POA: Diagnosis not present

## 2020-01-23 DIAGNOSIS — E7800 Pure hypercholesterolemia, unspecified: Secondary | ICD-10-CM | POA: Diagnosis not present

## 2020-01-23 DIAGNOSIS — I10 Essential (primary) hypertension: Secondary | ICD-10-CM | POA: Diagnosis not present

## 2020-01-23 DIAGNOSIS — M810 Age-related osteoporosis without current pathological fracture: Secondary | ICD-10-CM | POA: Diagnosis not present

## 2020-02-18 DIAGNOSIS — K219 Gastro-esophageal reflux disease without esophagitis: Secondary | ICD-10-CM | POA: Diagnosis not present

## 2020-02-18 DIAGNOSIS — N183 Chronic kidney disease, stage 3 unspecified: Secondary | ICD-10-CM | POA: Diagnosis not present

## 2020-02-18 DIAGNOSIS — M1990 Unspecified osteoarthritis, unspecified site: Secondary | ICD-10-CM | POA: Diagnosis not present

## 2020-02-18 DIAGNOSIS — J449 Chronic obstructive pulmonary disease, unspecified: Secondary | ICD-10-CM | POA: Diagnosis not present

## 2020-02-18 DIAGNOSIS — E7800 Pure hypercholesterolemia, unspecified: Secondary | ICD-10-CM | POA: Diagnosis not present

## 2020-02-18 DIAGNOSIS — M810 Age-related osteoporosis without current pathological fracture: Secondary | ICD-10-CM | POA: Diagnosis not present

## 2020-02-18 DIAGNOSIS — I10 Essential (primary) hypertension: Secondary | ICD-10-CM | POA: Diagnosis not present

## 2020-03-05 DIAGNOSIS — H53002 Unspecified amblyopia, left eye: Secondary | ICD-10-CM | POA: Diagnosis not present

## 2020-03-05 DIAGNOSIS — H353221 Exudative age-related macular degeneration, left eye, with active choroidal neovascularization: Secondary | ICD-10-CM | POA: Diagnosis not present

## 2020-03-05 DIAGNOSIS — H04123 Dry eye syndrome of bilateral lacrimal glands: Secondary | ICD-10-CM | POA: Diagnosis not present

## 2020-03-05 DIAGNOSIS — Z961 Presence of intraocular lens: Secondary | ICD-10-CM | POA: Diagnosis not present

## 2020-03-05 DIAGNOSIS — H353211 Exudative age-related macular degeneration, right eye, with active choroidal neovascularization: Secondary | ICD-10-CM | POA: Diagnosis not present

## 2020-03-05 DIAGNOSIS — H353231 Exudative age-related macular degeneration, bilateral, with active choroidal neovascularization: Secondary | ICD-10-CM | POA: Diagnosis not present

## 2020-03-17 NOTE — Progress Notes (Signed)
Cardiology Office Note   Date:  03/18/2020   ID:  Vicki Hernandez, DOB 05-06-1928, MRN [id number]  PCP:  Daisy Floro, MD    No chief complaint on file.  SVT  Wt Readings from Last 3 Encounters:  03/18/20 130 lb 3.2 oz (59.1 kg)  03/19/19 134 lb 6.4 oz (61 kg)  03/19/18 134 lb 6.4 oz (61 kg)       History of Present Illness: Vicki Hernandez is a 87 y.o. female  who has had CEA, after having a knot and bruit noted in 2013. She had a rapid pulse in 2014 while at the vascular surgeon. Sx. resolved prior to ECG. She wore a monitor in 2014. She did not have any palpitations at that time while wearing the monitor. No arrhythmia noted at that time. She has been taken to the hospital years ago for this. She has used a beta blocker as needed which quickly resolves her symptoms.   2014 echo showed Normal LV function, HOCM anatomy.  Several years ago, she moved to [facility] and has to walk more to get to the dining hall.No GERD sx with walking. GERD seems to be the trigger for her SVT, but decreased GERD by eating more salads.   SVTQuickly resolves with metoprololin the past. SHe didnot want to increase the dose in the past.    She stopped smoking cold turkeya few years ago.  SInce the last visit, she has had fatigue and DOE. Occasional aching in her chest. Fatigue and shortness of breath are the biggest symptoms. She has tried some nitroglycerin under her tongue with relief. No leg swelling of note.    Palpitations have been well under control. Family was concerned that metoprolol was causing her fatigue and that she was on too much metoprolol.  Patient noted she does still have occasional palpitations that require her to sit and rest. She describes taking care of her husband is getting more difficult.     Past Medical History:  Diagnosis Date  . Accessory kidney   . Angina    3 WEEKS  . Arthritis    OSTEO   . Atrial fibrillation ([facility]) 2014  .  Carotid artery bruit   . Complication of anesthesia    TAKES AWHILE TO WAKE UP   . Dysrhythmia    TACHYCARDIA   . GERD (gastroesophageal reflux disease)   . H/O hiatal hernia   . Heart murmur   . Hyperlipidemia   . Hypertension   . Macular degeneration     Past Surgical History:  Procedure Laterality Date  . ABDOMINAL HYSTERECTOMY  1974   TAH w/ BSO  . APPENDECTOMY    . CARDIOVASCULAR STRESS TEST     5 YRS AGO     DR H SMITH  (NOW SEES DR Eldridge Dace)  . CAROTID ENDARTERECTOMY Left 03-25-11   cea  . CATARACT EXTRACTION W/ INTRAOCULAR LENS  IMPLANT, BILATERAL    . ENDARTERECTOMY  03/25/2011   Procedure: ENDARTERECTOMY CAROTID;  Surgeon: Pryor Ochoa, MD;  Location: [facility] OR;  Service: Vascular;  Laterality: Left;  Left Carotid Endarterectomy with dacron patch angioplasty, with resection of redundant common carotid with primary reanastamosis  . EYE SURGERY    . HEMORRHOID SURGERY    . JOINT REPLACEMENT  2012   Left Knee  . KNEE SURGERY    . SPINE SURGERY  2002, 2010   Lumbar disk/ spine surgeries X 2   By Dr. Shon Baton  .  TONSILLECTOMY       Current Outpatient Medications  Medication Sig Dispense Refill  . aspirin EC 81 MG tablet Take 1 tablet (81 mg total) by mouth daily. 90 tablet 3  . cyanocobalamin (,VITAMIN B-12,) 1000 MCG/ML injection SMARTSIG:1 Milliliter(s) Injection Once a Month    . esomeprazole (NEXIUM) 20 MG capsule Take 20 mg by mouth daily at 12 noon.    . metoprolol tartrate (LOPRESSOR) 25 MG tablet TAKE 1/2 TABLET TWICE DAILY, MAY TAKE AN EXTRA 1 TABLET AS NEEDED FOR PALPITATIONS 30 tablet 4  . Multiple Vitamins-Minerals (EYE-VITES PO) Take 1 tablet by mouth daily.     . nitroGLYCERIN (NITROSTAT) 0.4 MG SL tablet Place 1 tablet (0.4 mg total) under the tongue every 5 (five) minutes as needed for chest pain. 25 tablet 3  . Polyethyl Glycol-Propyl Glycol (SYSTANE OP) Place 1-2 drops into both eyes daily as needed. For dry eyes    . polyethylene glycol (MIRALAX /  GLYCOLAX) packet Take 17 g by mouth daily as needed for mild constipation or moderate constipation.    . simvastatin (ZOCOR) 40 MG tablet Take 1 tablet (40 mg total) by mouth every evening. 90 tablet 3  . SYMBICORT 160-4.5 MCG/ACT inhaler     . telmisartan (MICARDIS) 40 MG tablet Take 40 mg by mouth daily.    Marland Kitchen VITAMIN D, ERGOCALCIFEROL, PO Take 1 capsule by mouth every morning.      No current facility-administered medications for this visit.    Allergies:   Sulfa antibiotics, Codeine, and Cymbalta [duloxetine hcl]    Social History:  The patient  reports that she quit smoking about 4 years ago. Her smoking use included cigarettes. She smoked 0.50 packs per day. She has never used smokeless tobacco. She reports that she does not drink alcohol and does not use drugs.   Family History:  The patient's family history includes Heart attack in her father; Heart disease in her father and mother; Hyperlipidemia in her father; Hypertension in her father; Stroke in her mother.    ROS:  Please see the history of present illness.   Otherwise, review of systems are positive for DOE/fatigue.   All other systems are reviewed and negative.    PHYSICAL EXAM: VS:  BP (!) 146/72   Pulse (!) 57   Ht 5\' 2"  (1.575 m)   Wt 130 lb 3.2 oz (59.1 kg)   SpO2 94%   BMI 23.81 kg/m  , BMI Body mass index is 23.81 kg/m. GEN: Well nourished, well developed, in no acute distress  HEENT: normal  Neck: no JVD, carotid bruits, or masses Cardiac: RRR; no murmurs, rubs, or gallops,no edema  Respiratory:  clear to auscultation bilaterally, normal work of breathing GI: soft, nontender, nondistended, + BS MS: no deformity or atrophy  Skin: warm and dry, no rash Neuro:  Strength and sensation are intact Psych: euthymic mood, full affect   EKG:   The ekg ordered today demonstrates NSR, nonspecific ST changes   Recent Labs: No results found for requested labs within last 8760 hours.   Lipid Panel No results  found for: CHOL, TRIG, HDL, CHOLHDL, VLDL, LDLCALC, LDLDIRECT   Other studies Reviewed: Additional studies/ records that were reviewed today with results demonstrating: LDL 90 in 12/21.   ASSESSMENT AND PLAN:  1. SVT:  COntrolled. COntinue metoprolol.  Occasionally uses extra dose when she feels palpitations.  2. HTN: The current medical regimen is effective;  continue present plan and medications.  Not checking very  often at home.  3. PAD: prior carotid disease. This certainly increases the possibility of coronary artery disease. If the echocardiogram shows an abnormal LVEF, would have to consider catheterization. Given her age, would likely try long-acting nitrates first to see if we can medically manage her potential angina symptoms. Could also consider stress testing if the LVEF is normal. 4. Mild HOCM: Check echo, especially given recent DOE.  Difficult to tell if this is just usual aging, or a combination with her lung disease.   5. Former smoker: Quit several years ago.  Has some degree of COPD per her report. 6. Check BMP, CBC and BNP given shortness of breath. Patient's daughter requests B12 level as well.    Current medicines are reviewed at length with the patient today.  The patient concerns regarding her medicines were addressed.  The following changes have been made:  No change  Labs/ tests ordered today include:  No orders of the defined types were placed in this encounter.   Recommend 150 minutes/week of aerobic exercise Low fat, low carb, high fiber diet recommended  Disposition:   FU in based on test results   Signed, Lance Muss, MD  03/18/2020 9:45 AM    [facility] [address] [place], [place], [place]  [address] Phone: [phone]; Fax: [phone]

## 2020-03-18 ENCOUNTER — Encounter: Payer: Self-pay | Admitting: Interventional Cardiology

## 2020-03-18 ENCOUNTER — Ambulatory Visit: Payer: Medicare Other | Admitting: Interventional Cardiology

## 2020-03-18 ENCOUNTER — Other Ambulatory Visit: Payer: Self-pay

## 2020-03-18 VITALS — BP 146/72 | HR 57 | Ht 62.0 in | Wt 130.2 lb

## 2020-03-18 DIAGNOSIS — Z9889 Other specified postprocedural states: Secondary | ICD-10-CM

## 2020-03-18 DIAGNOSIS — Z79899 Other long term (current) drug therapy: Secondary | ICD-10-CM

## 2020-03-18 DIAGNOSIS — I471 Supraventricular tachycardia, unspecified: Secondary | ICD-10-CM

## 2020-03-18 DIAGNOSIS — R0602 Shortness of breath: Secondary | ICD-10-CM | POA: Diagnosis not present

## 2020-03-18 DIAGNOSIS — I10 Essential (primary) hypertension: Secondary | ICD-10-CM

## 2020-03-18 DIAGNOSIS — I421 Obstructive hypertrophic cardiomyopathy: Secondary | ICD-10-CM

## 2020-03-18 DIAGNOSIS — Z87891 Personal history of nicotine dependence: Secondary | ICD-10-CM | POA: Diagnosis not present

## 2020-03-18 MED ORDER — NITROGLYCERIN 0.4 MG SL SUBL: 0.4000 mg | SUBLINGUAL_TABLET | SUBLINGUAL | 6 refills | Status: AC | PRN

## 2020-03-18 NOTE — Patient Instructions (Signed)
Medication Instructions:  Your physician recommends that you continue on your current medications as directed. Please refer to the Current Medication list given to you today.  *If you need a refill on your cardiac medications before your next appointment, please call your pharmacy*   Lab Work: Lab work to be done today--proBNP, BMP, CBC, Vitamin B12 If you have labs (blood work) drawn today and your tests are completely normal, you will receive your results only by: Marland Kitchen MyChart Message (if you have MyChart) OR . A paper copy in the mail If you have any lab test that is abnormal or we need to change your treatment, we will call you to review the results.   Testing/Procedures: Your physician has requested that you have an echocardiogram. Echocardiography is a painless test that uses sound waves to create images of your heart. It provides your doctor with information about the size and shape of your heart and how well your heart's chambers and valves are working. This procedure takes approximately one hour. There are no restrictions for this procedure.     Follow-Up: At [facility], you and your health needs are our priority.  As part of our continuing mission to provide you with exceptional heart care, we have created designated Provider Care Teams.  These Care Teams include your primary Cardiologist (physician) and Advanced Practice Providers (APPs -  Physician Assistants and Nurse Practitioners) who all work together to provide you with the care you need, when you need it.  We recommend signing up for the patient portal called "MyChart".  Sign up information is provided on this After Visit Summary.  MyChart is used to connect with patients for Virtual Visits (Telemedicine).  Patients are able to view lab/test results, encounter notes, upcoming appointments, etc.  Non-urgent messages can be sent to your provider as well.   To learn more about what you can do with MyChart, go to  [email or web address].    Your next appointment:   Based on echo results  The format for your next appointment:   In Person  Provider:   You may see Dr Eldridge Dace or one of the following Advanced Practice Providers on your designated Care Team:    Ronie Spies, PA-C  Jacolyn Reedy, PA-C    Other Instructions

## 2020-03-19 LAB — BASIC METABOLIC PANEL
BUN/Creatinine Ratio: 12 (ref 12–28)
BUN: 14 mg/dL (ref 8–27)
CO2: 24 mmol/L (ref 20–29)
Calcium: 9.3 mg/dL (ref 8.7–10.3)
Chloride: 103 mmol/L (ref 96–106)
Creatinine, Ser: 1.13 mg/dL — ABNORMAL HIGH (ref 0.57–1.00)
Glucose: 96 mg/dL (ref 65–99)
Potassium: 4.6 mmol/L (ref 3.5–5.2)
Sodium: 139 mmol/L (ref 134–144)
eGFR: 47 mL/min/{1.73_m2} — ABNORMAL LOW (ref >59)

## 2020-03-19 LAB — CBC
Hematocrit: 36.9 % (ref 34.0–46.6)
Hemoglobin: 12.3 g/dL (ref 11.1–15.9)
MCH: 29.6 pg (ref 26.6–33.0)
MCHC: 33.3 g/dL (ref 31.5–35.7)
MCV: 89 fL (ref 79–97)
Platelets: 239 10*3/uL (ref 150–450)
RBC: 4.15 x10E6/uL (ref 3.77–5.28)
RDW: 13.4 % (ref 11.7–15.4)
WBC: 7.7 10*3/uL (ref 3.4–10.8)

## 2020-03-19 LAB — PRO B NATRIURETIC PEPTIDE: NT-Pro BNP: 213 pg/mL (ref 0–738)

## 2020-03-19 LAB — VITAMIN B12: Vitamin B-12: 461 pg/mL (ref 232–1245)

## 2020-03-30 DIAGNOSIS — E538 Deficiency of other specified B group vitamins: Secondary | ICD-10-CM | POA: Diagnosis not present

## 2020-03-30 DIAGNOSIS — R5383 Other fatigue: Secondary | ICD-10-CM | POA: Diagnosis not present

## 2020-03-30 DIAGNOSIS — J449 Chronic obstructive pulmonary disease, unspecified: Secondary | ICD-10-CM | POA: Diagnosis not present

## 2020-03-30 DIAGNOSIS — R0600 Dyspnea, unspecified: Secondary | ICD-10-CM | POA: Diagnosis not present

## 2020-03-31 ENCOUNTER — Telehealth: Payer: Self-pay | Admitting: Interventional Cardiology

## 2020-03-31 NOTE — Telephone Encounter (Signed)
OK. COntinue to monitor for symptoms.  If she has to use NTG frequently, would consider Imdur 30 mg daily.

## 2020-03-31 NOTE — Telephone Encounter (Signed)
RN spoke with patients husband who was calling to inform the office that he gave his wife nitroglycerin last night for reported chest pain, that he feels was indigestion. When he gave the nitroglycerin chest pain subsided and patient was able to go to sleep. Spouse states that patient is currently still sleeping and he is unsure if the chest pain returned. Spouse states that patient has had shortness of breath due to being a smoker. Denies any nausea and vomiting. RN explained that if patient is still currently experiencing chest pain when she wakes up, please call the office to let us know. Spouse states he did not want to "alarm [place]," but wanted to inform Dr.Varanasi. RN informed spouse I would send a message to Dr. Eldridge Dace and his nurse and would call with any information. Spouse thanked Charity fundraiser for the call.  Call sent to Dr.Varanasi and Dennie Bible, RN,

## 2020-03-31 NOTE — Telephone Encounter (Signed)
Patient's husband notified.  He will call if patient has to use NTG frequently

## 2020-03-31 NOTE — Telephone Encounter (Signed)
Pt c/o of Chest Pain: STAT if CP now or developed within 24 hours  1. Are you having CP right now? Yes   2. Are you experiencing any other symptoms (ex. SOB, nausea, vomiting, sweating)? SOB  3. How long have you been experiencing CP? Last night  4. Is your CP continuous or coming and going? Coming and going  5. Have you taken Nitroglycerin? Yes  ?

## 2020-04-14 ENCOUNTER — Ambulatory Visit (HOSPITAL_COMMUNITY): Payer: Medicare Other | Attending: Internal Medicine

## 2020-04-14 ENCOUNTER — Other Ambulatory Visit: Payer: Self-pay

## 2020-04-14 DIAGNOSIS — I10 Essential (primary) hypertension: Secondary | ICD-10-CM

## 2020-04-14 DIAGNOSIS — R0602 Shortness of breath: Secondary | ICD-10-CM | POA: Diagnosis not present

## 2020-04-14 LAB — ECHOCARDIOGRAM COMPLETE
Area-P 1/2: 2.2 cm2
S' Lateral: 2.15 cm

## 2020-04-16 ENCOUNTER — Telehealth: Payer: Self-pay | Admitting: Interventional Cardiology

## 2020-04-16 DIAGNOSIS — E7800 Pure hypercholesterolemia, unspecified: Secondary | ICD-10-CM | POA: Diagnosis not present

## 2020-04-16 DIAGNOSIS — R079 Chest pain, unspecified: Secondary | ICD-10-CM

## 2020-04-16 DIAGNOSIS — M810 Age-related osteoporosis without current pathological fracture: Secondary | ICD-10-CM | POA: Diagnosis not present

## 2020-04-16 DIAGNOSIS — N183 Chronic kidney disease, stage 3 unspecified: Secondary | ICD-10-CM | POA: Diagnosis not present

## 2020-04-16 DIAGNOSIS — R0602 Shortness of breath: Secondary | ICD-10-CM

## 2020-04-16 DIAGNOSIS — K219 Gastro-esophageal reflux disease without esophagitis: Secondary | ICD-10-CM | POA: Diagnosis not present

## 2020-04-16 DIAGNOSIS — J449 Chronic obstructive pulmonary disease, unspecified: Secondary | ICD-10-CM | POA: Diagnosis not present

## 2020-04-16 DIAGNOSIS — I10 Essential (primary) hypertension: Secondary | ICD-10-CM | POA: Diagnosis not present

## 2020-04-16 DIAGNOSIS — M1990 Unspecified osteoarthritis, unspecified site: Secondary | ICD-10-CM | POA: Diagnosis not present

## 2020-04-16 NOTE — Telephone Encounter (Signed)
Patient's daughter returning call for echo results.    

## 2020-04-16 NOTE — Telephone Encounter (Signed)
I spoke with patient's daughter and reviewed echo results with her.  She reports patient continues to feel very fatigued.  Has to use NTG most evenings due to pain.  Pain relieved with NTG.  Today she walked about 50 yards and was exhausted.  Did have shortness of breath today when walking.  No chest pain. Patient's daughter asking for Dr Hoyle Barr recommendations regarding possible need for additional testing

## 2020-04-17 NOTE — Telephone Encounter (Signed)
Returned call to Pt's daughter.  She would like to go ahead with stress test for her mother.  Order entered.  Will send back to Dr. Eldridge Dace to sign informed consent.

## 2020-04-17 NOTE — Telephone Encounter (Signed)
OK to order [employer]

## 2020-04-20 NOTE — Telephone Encounter (Signed)
I don't know how to sign attestation. Vicki Hernandez

## 2020-04-22 ENCOUNTER — Encounter (HOSPITAL_COMMUNITY): Payer: Medicare Other

## 2020-04-29 ENCOUNTER — Telehealth (HOSPITAL_COMMUNITY): Payer: Self-pay | Admitting: *Deleted

## 2020-04-29 NOTE — Telephone Encounter (Signed)
Patient given detailed instructions per Myocardial Perfusion Study Information Sheet for the test on 05/01/20 at 10:30. Patient notified to arrive 15 minutes early and that it is imperative to arrive on time for appointment to keep from having the test rescheduled.  If you need to cancel or reschedule your appointment, please call the office within 24 hours of your appointment. . Patient verbalized understanding.Vicki Hernandez

## 2020-05-01 ENCOUNTER — Other Ambulatory Visit: Payer: Self-pay

## 2020-05-01 ENCOUNTER — Ambulatory Visit (HOSPITAL_COMMUNITY): Payer: Medicare Other | Attending: Cardiology

## 2020-05-01 DIAGNOSIS — R079 Chest pain, unspecified: Secondary | ICD-10-CM | POA: Diagnosis not present

## 2020-05-01 DIAGNOSIS — R0602 Shortness of breath: Secondary | ICD-10-CM | POA: Diagnosis not present

## 2020-05-01 LAB — MYOCARDIAL PERFUSION IMAGING
LV dias vol: 46 mL (ref 46–106)
LV sys vol: 9 mL
Peak HR: 93 {beats}/min
Rest HR: 69 {beats}/min
SDS: 1
SRS: 5
SSS: 6
TID: 0.92

## 2020-05-01 MED ORDER — TECHNETIUM TC 99M TETROFOSMIN IV KIT
9.8000 | PACK | Freq: Once | INTRAVENOUS | Status: AC | PRN
  Administered 2020-05-01: 9.8 via INTRAVENOUS
  Filled 2020-05-01: qty 10

## 2020-05-01 MED ORDER — REGADENOSON 0.4 MG/5ML IV SOLN
0.4000 mg | Freq: Once | INTRAVENOUS | Status: AC
Start: 2020-05-01 — End: 2020-05-01
  Administered 2020-05-01: 0.4 mg via INTRAVENOUS

## 2020-05-01 MED ORDER — TECHNETIUM TC 99M TETROFOSMIN IV KIT
30.5000 | PACK | Freq: Once | INTRAVENOUS | Status: AC | PRN
  Administered 2020-05-01: 30.5 via INTRAVENOUS
  Filled 2020-05-01: qty 31

## 2020-05-05 ENCOUNTER — Telehealth: Payer: Self-pay | Admitting: Interventional Cardiology

## 2020-05-05 NOTE — Telephone Encounter (Signed)
Spoke to [place] ([facility]) to let him know stress tests have not resulted.  Informed husband that when we receive results from Dr. Eldridge Dace that they would go to his nurse and he will be called.  Patient appreciative of call.

## 2020-05-05 NOTE — Telephone Encounter (Signed)
    Pt spouse called, following up stress test. They would like to know if result is available

## 2020-05-06 NOTE — Telephone Encounter (Signed)
Patient's husband is very frustrated. He states he thought he would get a call back today about his wife's results, but has not heard anything. He states it is his right find out some reasonable information to give to his wife. Informed him Dr. Eldridge Dace is not in the office this week. He would like to know if there is another provider who can give them some information of the results.

## 2020-05-06 NOTE — Telephone Encounter (Signed)
Returned call and spoke with the patient. She said everybody is concerned because they thought they'd hear by the beginning of this week about the results.  She had chest pain again yesterday afternoon and last night and both times they were relieved by ntg.  I adv that the study is reported as being ow risk, and that the pumping function was strong which was also reported in her echocardiogram.  This gives her some satisfaction and relief.  She thanked me for calling.  She is aware that after study is reviewed by Dr. Eldridge Dace she will receive another call back.  She is aware that he is away until next week.

## 2020-05-07 NOTE — Telephone Encounter (Signed)
Thanks Michalene,  I agree.  Can try some low dose Imdur 15 mg daily to see if this helps her sx.  I sent a different message to triage.  JV

## 2020-05-08 MED ORDER — ISOSORBIDE MONONITRATE ER 30 MG PO TB24: 30.0000 mg | ORAL_TABLET | Freq: Every day | ORAL | 3 refills | Status: DC

## 2020-05-08 MED ORDER — ISOSORBIDE MONONITRATE ER 30 MG PO TB24: ORAL_TABLET | ORAL | 3 refills | Status: DC

## 2020-05-08 NOTE — Addendum Note (Signed)
Addended by: Gery Pray on: 05/08/2020 12:32 PM   Modules accepted: Orders

## 2020-05-08 NOTE — Telephone Encounter (Signed)
Pt c/o medication issue:  1. Name of Medication:  isosorbide mononitrate (IMDUR) 30 MG 24 hr tablet  2. How are you currently taking this medication (dosage and times per day)?  N/a  3. Are you having a reaction (difficulty breathing--STAT)? No   4. What is your medication issue?  Patient's husband is requesting clarification on the medication instructions. The prescription is written for 1 tablet 30 MG by mouth daily, but looks like the patient has been told to take a 1/2 tablet (15 MG total) daily. Please clarify.

## 2020-05-08 NOTE — Telephone Encounter (Signed)
Spoke with patient and clarified the medication does not come in 15mg  tablet and to cut the 30mg  in half.  Patient verbalized understanding.

## 2020-05-08 NOTE — Addendum Note (Signed)
Addended by: Baird Lyons on: 05/08/2020 08:54 AM   Modules accepted: Orders

## 2020-05-08 NOTE — Telephone Encounter (Signed)
Pt and husband notified and agreeable to plan. Rx sent to [facility] as requested. Advised to call office if no improvement in sx after starting. They are agreeable to plan.

## 2020-05-15 DIAGNOSIS — E7800 Pure hypercholesterolemia, unspecified: Secondary | ICD-10-CM | POA: Diagnosis not present

## 2020-05-15 DIAGNOSIS — J449 Chronic obstructive pulmonary disease, unspecified: Secondary | ICD-10-CM | POA: Diagnosis not present

## 2020-05-15 DIAGNOSIS — M1990 Unspecified osteoarthritis, unspecified site: Secondary | ICD-10-CM | POA: Diagnosis not present

## 2020-05-15 DIAGNOSIS — M810 Age-related osteoporosis without current pathological fracture: Secondary | ICD-10-CM | POA: Diagnosis not present

## 2020-05-15 DIAGNOSIS — K219 Gastro-esophageal reflux disease without esophagitis: Secondary | ICD-10-CM | POA: Diagnosis not present

## 2020-05-15 DIAGNOSIS — I10 Essential (primary) hypertension: Secondary | ICD-10-CM | POA: Diagnosis not present

## 2020-05-15 DIAGNOSIS — N183 Chronic kidney disease, stage 3 unspecified: Secondary | ICD-10-CM | POA: Diagnosis not present

## 2020-05-19 DIAGNOSIS — M1990 Unspecified osteoarthritis, unspecified site: Secondary | ICD-10-CM | POA: Diagnosis not present

## 2020-05-19 DIAGNOSIS — I10 Essential (primary) hypertension: Secondary | ICD-10-CM | POA: Diagnosis not present

## 2020-05-19 DIAGNOSIS — N183 Chronic kidney disease, stage 3 unspecified: Secondary | ICD-10-CM | POA: Diagnosis not present

## 2020-05-19 DIAGNOSIS — M810 Age-related osteoporosis without current pathological fracture: Secondary | ICD-10-CM | POA: Diagnosis not present

## 2020-05-19 DIAGNOSIS — E7800 Pure hypercholesterolemia, unspecified: Secondary | ICD-10-CM | POA: Diagnosis not present

## 2020-05-19 DIAGNOSIS — K219 Gastro-esophageal reflux disease without esophagitis: Secondary | ICD-10-CM | POA: Diagnosis not present

## 2020-05-19 DIAGNOSIS — J449 Chronic obstructive pulmonary disease, unspecified: Secondary | ICD-10-CM | POA: Diagnosis not present

## 2020-06-04 DIAGNOSIS — Z961 Presence of intraocular lens: Secondary | ICD-10-CM | POA: Diagnosis not present

## 2020-06-04 DIAGNOSIS — H353231 Exudative age-related macular degeneration, bilateral, with active choroidal neovascularization: Secondary | ICD-10-CM | POA: Diagnosis not present

## 2020-06-04 DIAGNOSIS — H353211 Exudative age-related macular degeneration, right eye, with active choroidal neovascularization: Secondary | ICD-10-CM | POA: Diagnosis not present

## 2020-06-04 DIAGNOSIS — H353221 Exudative age-related macular degeneration, left eye, with active choroidal neovascularization: Secondary | ICD-10-CM | POA: Diagnosis not present

## 2020-06-21 DIAGNOSIS — N183 Chronic kidney disease, stage 3 unspecified: Secondary | ICD-10-CM | POA: Diagnosis not present

## 2020-06-21 DIAGNOSIS — M810 Age-related osteoporosis without current pathological fracture: Secondary | ICD-10-CM | POA: Diagnosis not present

## 2020-06-21 DIAGNOSIS — J449 Chronic obstructive pulmonary disease, unspecified: Secondary | ICD-10-CM | POA: Diagnosis not present

## 2020-06-21 DIAGNOSIS — K219 Gastro-esophageal reflux disease without esophagitis: Secondary | ICD-10-CM | POA: Diagnosis not present

## 2020-06-21 DIAGNOSIS — M1990 Unspecified osteoarthritis, unspecified site: Secondary | ICD-10-CM | POA: Diagnosis not present

## 2020-06-21 DIAGNOSIS — I10 Essential (primary) hypertension: Secondary | ICD-10-CM | POA: Diagnosis not present

## 2020-06-21 DIAGNOSIS — E7800 Pure hypercholesterolemia, unspecified: Secondary | ICD-10-CM | POA: Diagnosis not present

## 2020-07-21 DIAGNOSIS — M810 Age-related osteoporosis without current pathological fracture: Secondary | ICD-10-CM | POA: Diagnosis not present

## 2020-07-21 DIAGNOSIS — M1990 Unspecified osteoarthritis, unspecified site: Secondary | ICD-10-CM | POA: Diagnosis not present

## 2020-07-21 DIAGNOSIS — E7800 Pure hypercholesterolemia, unspecified: Secondary | ICD-10-CM | POA: Diagnosis not present

## 2020-07-21 DIAGNOSIS — K219 Gastro-esophageal reflux disease without esophagitis: Secondary | ICD-10-CM | POA: Diagnosis not present

## 2020-07-21 DIAGNOSIS — N183 Chronic kidney disease, stage 3 unspecified: Secondary | ICD-10-CM | POA: Diagnosis not present

## 2020-07-21 DIAGNOSIS — J449 Chronic obstructive pulmonary disease, unspecified: Secondary | ICD-10-CM | POA: Diagnosis not present

## 2020-07-21 DIAGNOSIS — I10 Essential (primary) hypertension: Secondary | ICD-10-CM | POA: Diagnosis not present

## 2020-08-05 DIAGNOSIS — L03116 Cellulitis of left lower limb: Secondary | ICD-10-CM | POA: Diagnosis not present

## 2020-08-05 DIAGNOSIS — S81802A Unspecified open wound, left lower leg, initial encounter: Secondary | ICD-10-CM | POA: Diagnosis not present

## 2020-08-05 DIAGNOSIS — L821 Other seborrheic keratosis: Secondary | ICD-10-CM | POA: Diagnosis not present

## 2020-08-10 ENCOUNTER — Other Ambulatory Visit: Payer: Self-pay | Admitting: Interventional Cardiology

## 2020-09-11 DIAGNOSIS — J449 Chronic obstructive pulmonary disease, unspecified: Secondary | ICD-10-CM | POA: Diagnosis not present

## 2020-09-11 DIAGNOSIS — K219 Gastro-esophageal reflux disease without esophagitis: Secondary | ICD-10-CM | POA: Diagnosis not present

## 2020-09-11 DIAGNOSIS — N183 Chronic kidney disease, stage 3 unspecified: Secondary | ICD-10-CM | POA: Diagnosis not present

## 2020-09-11 DIAGNOSIS — E7800 Pure hypercholesterolemia, unspecified: Secondary | ICD-10-CM | POA: Diagnosis not present

## 2020-09-11 DIAGNOSIS — I10 Essential (primary) hypertension: Secondary | ICD-10-CM | POA: Diagnosis not present

## 2020-09-11 DIAGNOSIS — M1990 Unspecified osteoarthritis, unspecified site: Secondary | ICD-10-CM | POA: Diagnosis not present

## 2020-09-11 DIAGNOSIS — M810 Age-related osteoporosis without current pathological fracture: Secondary | ICD-10-CM | POA: Diagnosis not present

## 2020-10-13 DIAGNOSIS — M810 Age-related osteoporosis without current pathological fracture: Secondary | ICD-10-CM | POA: Diagnosis not present

## 2020-10-13 DIAGNOSIS — J449 Chronic obstructive pulmonary disease, unspecified: Secondary | ICD-10-CM | POA: Diagnosis not present

## 2020-10-13 DIAGNOSIS — M1990 Unspecified osteoarthritis, unspecified site: Secondary | ICD-10-CM | POA: Diagnosis not present

## 2020-10-13 DIAGNOSIS — K219 Gastro-esophageal reflux disease without esophagitis: Secondary | ICD-10-CM | POA: Diagnosis not present

## 2020-10-13 DIAGNOSIS — I10 Essential (primary) hypertension: Secondary | ICD-10-CM | POA: Diagnosis not present

## 2020-10-13 DIAGNOSIS — E7800 Pure hypercholesterolemia, unspecified: Secondary | ICD-10-CM | POA: Diagnosis not present

## 2020-10-13 DIAGNOSIS — N183 Chronic kidney disease, stage 3 unspecified: Secondary | ICD-10-CM | POA: Diagnosis not present

## 2020-10-22 DIAGNOSIS — Z961 Presence of intraocular lens: Secondary | ICD-10-CM | POA: Diagnosis not present

## 2020-10-22 DIAGNOSIS — H353221 Exudative age-related macular degeneration, left eye, with active choroidal neovascularization: Secondary | ICD-10-CM | POA: Diagnosis not present

## 2020-10-22 DIAGNOSIS — H353211 Exudative age-related macular degeneration, right eye, with active choroidal neovascularization: Secondary | ICD-10-CM | POA: Diagnosis not present

## 2020-10-22 DIAGNOSIS — H353231 Exudative age-related macular degeneration, bilateral, with active choroidal neovascularization: Secondary | ICD-10-CM | POA: Diagnosis not present

## 2020-10-22 DIAGNOSIS — H53002 Unspecified amblyopia, left eye: Secondary | ICD-10-CM | POA: Diagnosis not present

## 2020-10-22 DIAGNOSIS — H04123 Dry eye syndrome of bilateral lacrimal glands: Secondary | ICD-10-CM | POA: Diagnosis not present

## 2020-10-26 DIAGNOSIS — R079 Chest pain, unspecified: Secondary | ICD-10-CM | POA: Diagnosis not present

## 2020-10-26 DIAGNOSIS — R5383 Other fatigue: Secondary | ICD-10-CM | POA: Diagnosis not present

## 2020-10-26 DIAGNOSIS — J449 Chronic obstructive pulmonary disease, unspecified: Secondary | ICD-10-CM | POA: Diagnosis not present

## 2020-11-12 DIAGNOSIS — J449 Chronic obstructive pulmonary disease, unspecified: Secondary | ICD-10-CM | POA: Diagnosis not present

## 2020-11-12 DIAGNOSIS — M1990 Unspecified osteoarthritis, unspecified site: Secondary | ICD-10-CM | POA: Diagnosis not present

## 2020-11-12 DIAGNOSIS — K219 Gastro-esophageal reflux disease without esophagitis: Secondary | ICD-10-CM | POA: Diagnosis not present

## 2020-11-12 DIAGNOSIS — I10 Essential (primary) hypertension: Secondary | ICD-10-CM | POA: Diagnosis not present

## 2020-11-12 DIAGNOSIS — N183 Chronic kidney disease, stage 3 unspecified: Secondary | ICD-10-CM | POA: Diagnosis not present

## 2020-11-12 DIAGNOSIS — E7800 Pure hypercholesterolemia, unspecified: Secondary | ICD-10-CM | POA: Diagnosis not present

## 2020-11-12 DIAGNOSIS — M810 Age-related osteoporosis without current pathological fracture: Secondary | ICD-10-CM | POA: Diagnosis not present

## 2020-11-20 DIAGNOSIS — M5451 Vertebrogenic low back pain: Secondary | ICD-10-CM | POA: Diagnosis not present

## 2020-12-14 DIAGNOSIS — M810 Age-related osteoporosis without current pathological fracture: Secondary | ICD-10-CM | POA: Diagnosis not present

## 2020-12-14 DIAGNOSIS — N183 Chronic kidney disease, stage 3 unspecified: Secondary | ICD-10-CM | POA: Diagnosis not present

## 2020-12-14 DIAGNOSIS — J449 Chronic obstructive pulmonary disease, unspecified: Secondary | ICD-10-CM | POA: Diagnosis not present

## 2020-12-14 DIAGNOSIS — M1990 Unspecified osteoarthritis, unspecified site: Secondary | ICD-10-CM | POA: Diagnosis not present

## 2020-12-14 DIAGNOSIS — K219 Gastro-esophageal reflux disease without esophagitis: Secondary | ICD-10-CM | POA: Diagnosis not present

## 2020-12-14 DIAGNOSIS — M5416 Radiculopathy, lumbar region: Secondary | ICD-10-CM | POA: Diagnosis not present

## 2020-12-14 DIAGNOSIS — I10 Essential (primary) hypertension: Secondary | ICD-10-CM | POA: Diagnosis not present

## 2020-12-14 DIAGNOSIS — E7800 Pure hypercholesterolemia, unspecified: Secondary | ICD-10-CM | POA: Diagnosis not present

## 2020-12-29 DIAGNOSIS — M5451 Vertebrogenic low back pain: Secondary | ICD-10-CM | POA: Diagnosis not present

## 2021-01-06 DIAGNOSIS — E538 Deficiency of other specified B group vitamins: Secondary | ICD-10-CM | POA: Diagnosis not present

## 2021-01-06 DIAGNOSIS — I779 Disorder of arteries and arterioles, unspecified: Secondary | ICD-10-CM | POA: Diagnosis not present

## 2021-01-06 DIAGNOSIS — E611 Iron deficiency: Secondary | ICD-10-CM | POA: Diagnosis not present

## 2021-01-06 DIAGNOSIS — J449 Chronic obstructive pulmonary disease, unspecified: Secondary | ICD-10-CM | POA: Diagnosis not present

## 2021-01-06 DIAGNOSIS — I700 Atherosclerosis of aorta: Secondary | ICD-10-CM | POA: Diagnosis not present

## 2021-01-06 DIAGNOSIS — N183 Chronic kidney disease, stage 3 unspecified: Secondary | ICD-10-CM | POA: Diagnosis not present

## 2021-01-06 DIAGNOSIS — E7800 Pure hypercholesterolemia, unspecified: Secondary | ICD-10-CM | POA: Diagnosis not present

## 2021-01-06 DIAGNOSIS — M109 Gout, unspecified: Secondary | ICD-10-CM | POA: Diagnosis not present

## 2021-01-06 DIAGNOSIS — M810 Age-related osteoporosis without current pathological fracture: Secondary | ICD-10-CM | POA: Diagnosis not present

## 2021-01-06 DIAGNOSIS — I10 Essential (primary) hypertension: Secondary | ICD-10-CM | POA: Diagnosis not present

## 2021-01-06 DIAGNOSIS — Z0000 Encounter for general adult medical examination without abnormal findings: Secondary | ICD-10-CM | POA: Diagnosis not present

## 2021-01-08 DIAGNOSIS — Z0000 Encounter for general adult medical examination without abnormal findings: Secondary | ICD-10-CM | POA: Diagnosis not present

## 2021-01-10 DIAGNOSIS — E785 Hyperlipidemia, unspecified: Secondary | ICD-10-CM | POA: Diagnosis not present

## 2021-01-10 DIAGNOSIS — S199XXA Unspecified injury of neck, initial encounter: Secondary | ICD-10-CM | POA: Diagnosis not present

## 2021-01-10 DIAGNOSIS — Z043 Encounter for examination and observation following other accident: Secondary | ICD-10-CM | POA: Diagnosis not present

## 2021-01-10 DIAGNOSIS — Z87891 Personal history of nicotine dependence: Secondary | ICD-10-CM | POA: Diagnosis not present

## 2021-01-10 DIAGNOSIS — S0990XA Unspecified injury of head, initial encounter: Secondary | ICD-10-CM | POA: Diagnosis not present

## 2021-01-10 DIAGNOSIS — J449 Chronic obstructive pulmonary disease, unspecified: Secondary | ICD-10-CM | POA: Diagnosis not present

## 2021-01-10 DIAGNOSIS — M25519 Pain in unspecified shoulder: Secondary | ICD-10-CM | POA: Diagnosis not present

## 2021-01-10 DIAGNOSIS — W19XXXA Unspecified fall, initial encounter: Secondary | ICD-10-CM | POA: Diagnosis not present

## 2021-01-10 DIAGNOSIS — S4992XA Unspecified injury of left shoulder and upper arm, initial encounter: Secondary | ICD-10-CM | POA: Diagnosis not present

## 2021-01-10 DIAGNOSIS — R9089 Other abnormal findings on diagnostic imaging of central nervous system: Secondary | ICD-10-CM | POA: Diagnosis not present

## 2021-01-10 DIAGNOSIS — I10 Essential (primary) hypertension: Secondary | ICD-10-CM | POA: Diagnosis not present

## 2021-01-12 DIAGNOSIS — M25512 Pain in left shoulder: Secondary | ICD-10-CM | POA: Diagnosis not present

## 2021-02-11 ENCOUNTER — Other Ambulatory Visit: Payer: Self-pay | Admitting: Interventional Cardiology

## 2021-02-11 DIAGNOSIS — J449 Chronic obstructive pulmonary disease, unspecified: Secondary | ICD-10-CM | POA: Diagnosis not present

## 2021-02-11 DIAGNOSIS — I10 Essential (primary) hypertension: Secondary | ICD-10-CM | POA: Diagnosis not present

## 2021-02-11 DIAGNOSIS — M1990 Unspecified osteoarthritis, unspecified site: Secondary | ICD-10-CM | POA: Diagnosis not present

## 2021-02-11 DIAGNOSIS — N183 Chronic kidney disease, stage 3 unspecified: Secondary | ICD-10-CM | POA: Diagnosis not present

## 2021-02-11 DIAGNOSIS — M810 Age-related osteoporosis without current pathological fracture: Secondary | ICD-10-CM | POA: Diagnosis not present

## 2021-02-11 DIAGNOSIS — E7800 Pure hypercholesterolemia, unspecified: Secondary | ICD-10-CM | POA: Diagnosis not present

## 2021-02-11 DIAGNOSIS — K219 Gastro-esophageal reflux disease without esophagitis: Secondary | ICD-10-CM | POA: Diagnosis not present

## 2021-02-11 MED ORDER — ISOSORBIDE MONONITRATE ER 30 MG PO TB24: 15.0000 mg | ORAL_TABLET | Freq: Every day | ORAL | 3 refills | Status: DC

## 2021-02-11 NOTE — Telephone Encounter (Signed)
°*  STAT* If patient is at the pharmacy, call can be transferred to refill team.   1. Which medications need to be refilled? (please list name of each medication and dose if known) isosorbide mononitrate (IMDUR) 30 MG 24 hr tablet  2. Which pharmacy/location (including street and city if local pharmacy) is medication to be sent to? [facility] - [place], [place] - [place] [id number]  3. Do they need a 30 day or 90 day supply? 30 day

## 2021-02-16 ENCOUNTER — Telehealth: Payer: Self-pay | Admitting: Interventional Cardiology

## 2021-02-16 MED ORDER — ISOSORBIDE MONONITRATE ER 30 MG PO TB24: 15.0000 mg | ORAL_TABLET | Freq: Every day | ORAL | 0 refills | Status: DC

## 2021-02-16 NOTE — Telephone Encounter (Signed)
Pt's medication was sent to pt's pharmacy as requested. Confirmation received.  °

## 2021-02-16 NOTE — Telephone Encounter (Signed)
°*  STAT* If patient is at the pharmacy, call can be transferred to refill team.   1. Which medications need to be refilled? (please list name of each medication and dose if known) isosorbide mononitrate (IMDUR) 30 MG 24 hr tablet  2. Which pharmacy/location (including street and city if local pharmacy) is medication to be sent to? [facility] - [place], [place] - [place] [id number]  3. Do they need a 30 day or 90 day supply? 90  [facility]

## 2021-02-25 DIAGNOSIS — H353221 Exudative age-related macular degeneration, left eye, with active choroidal neovascularization: Secondary | ICD-10-CM | POA: Diagnosis not present

## 2021-02-25 DIAGNOSIS — Z961 Presence of intraocular lens: Secondary | ICD-10-CM | POA: Diagnosis not present

## 2021-02-25 DIAGNOSIS — H353211 Exudative age-related macular degeneration, right eye, with active choroidal neovascularization: Secondary | ICD-10-CM | POA: Diagnosis not present

## 2021-02-25 DIAGNOSIS — H04123 Dry eye syndrome of bilateral lacrimal glands: Secondary | ICD-10-CM | POA: Diagnosis not present

## 2021-02-25 DIAGNOSIS — H53002 Unspecified amblyopia, left eye: Secondary | ICD-10-CM | POA: Diagnosis not present

## 2021-03-11 DIAGNOSIS — N183 Chronic kidney disease, stage 3 unspecified: Secondary | ICD-10-CM | POA: Diagnosis not present

## 2021-03-11 DIAGNOSIS — J449 Chronic obstructive pulmonary disease, unspecified: Secondary | ICD-10-CM | POA: Diagnosis not present

## 2021-03-11 DIAGNOSIS — I10 Essential (primary) hypertension: Secondary | ICD-10-CM | POA: Diagnosis not present

## 2021-03-11 DIAGNOSIS — E7800 Pure hypercholesterolemia, unspecified: Secondary | ICD-10-CM | POA: Diagnosis not present

## 2021-07-29 DIAGNOSIS — H04123 Dry eye syndrome of bilateral lacrimal glands: Secondary | ICD-10-CM | POA: Diagnosis not present

## 2021-07-29 DIAGNOSIS — Z961 Presence of intraocular lens: Secondary | ICD-10-CM | POA: Diagnosis not present

## 2021-07-29 DIAGNOSIS — H353221 Exudative age-related macular degeneration, left eye, with active choroidal neovascularization: Secondary | ICD-10-CM | POA: Diagnosis not present

## 2021-07-29 DIAGNOSIS — H353211 Exudative age-related macular degeneration, right eye, with active choroidal neovascularization: Secondary | ICD-10-CM | POA: Diagnosis not present

## 2021-07-29 DIAGNOSIS — H53002 Unspecified amblyopia, left eye: Secondary | ICD-10-CM | POA: Diagnosis not present

## 2021-08-22 ENCOUNTER — Inpatient Hospital Stay (HOSPITAL_COMMUNITY)
Admission: EM | Admit: 2021-08-22 | Discharge: 2021-08-27 | DRG: 184 | Disposition: A | Discharge status: Skilled Nursing Facility | Payer: Medicare Other | Attending: Internal Medicine | Admitting: Internal Medicine

## 2021-08-22 ENCOUNTER — Other Ambulatory Visit: Payer: Self-pay

## 2021-08-22 ENCOUNTER — Emergency Department (HOSPITAL_COMMUNITY): Payer: Medicare Other

## 2021-08-22 DIAGNOSIS — E7800 Pure hypercholesterolemia, unspecified: Secondary | ICD-10-CM | POA: Insufficient documentation

## 2021-08-22 DIAGNOSIS — Z79899 Other long term (current) drug therapy: Secondary | ICD-10-CM

## 2021-08-22 DIAGNOSIS — W01198A Fall on same level from slipping, tripping and stumbling with subsequent striking against other object, initial encounter: Secondary | ICD-10-CM | POA: Diagnosis not present

## 2021-08-22 DIAGNOSIS — Z885 Allergy status to narcotic agent status: Secondary | ICD-10-CM

## 2021-08-22 DIAGNOSIS — I129 Hypertensive chronic kidney disease with stage 1 through stage 4 chronic kidney disease, or unspecified chronic kidney disease: Secondary | ICD-10-CM | POA: Diagnosis present

## 2021-08-22 DIAGNOSIS — Z7951 Long term (current) use of inhaled steroids: Secondary | ICD-10-CM

## 2021-08-22 DIAGNOSIS — K219 Gastro-esophageal reflux disease without esophagitis: Secondary | ICD-10-CM | POA: Diagnosis present

## 2021-08-22 DIAGNOSIS — S2241XA Multiple fractures of ribs, right side, initial encounter for closed fracture: Principal | ICD-10-CM | POA: Diagnosis present

## 2021-08-22 DIAGNOSIS — I700 Atherosclerosis of aorta: Secondary | ICD-10-CM | POA: Insufficient documentation

## 2021-08-22 DIAGNOSIS — Z90710 Acquired absence of both cervix and uterus: Secondary | ICD-10-CM | POA: Diagnosis not present

## 2021-08-22 DIAGNOSIS — K6389 Other specified diseases of intestine: Secondary | ICD-10-CM | POA: Diagnosis not present

## 2021-08-22 DIAGNOSIS — R1011 Right upper quadrant pain: Secondary | ICD-10-CM | POA: Diagnosis not present

## 2021-08-22 DIAGNOSIS — R0789 Other chest pain: Secondary | ICD-10-CM | POA: Diagnosis not present

## 2021-08-22 DIAGNOSIS — Z8249 Family history of ischemic heart disease and other diseases of the circulatory system: Secondary | ICD-10-CM

## 2021-08-22 DIAGNOSIS — I714 Abdominal aortic aneurysm, without rupture, unspecified: Secondary | ICD-10-CM | POA: Diagnosis not present

## 2021-08-22 DIAGNOSIS — M1990 Unspecified osteoarthritis, unspecified site: Secondary | ICD-10-CM | POA: Diagnosis not present

## 2021-08-22 DIAGNOSIS — Y92009 Unspecified place in unspecified non-institutional (private) residence as the place of occurrence of the external cause: Secondary | ICD-10-CM

## 2021-08-22 DIAGNOSIS — Z882 Allergy status to sulfonamides status: Secondary | ICD-10-CM

## 2021-08-22 DIAGNOSIS — Z66 Do not resuscitate: Secondary | ICD-10-CM | POA: Diagnosis present

## 2021-08-22 DIAGNOSIS — Q630 Accessory kidney: Secondary | ICD-10-CM | POA: Diagnosis not present

## 2021-08-22 DIAGNOSIS — N1831 Chronic kidney disease, stage 3a: Secondary | ICD-10-CM | POA: Diagnosis present

## 2021-08-22 DIAGNOSIS — R072 Precordial pain: Secondary | ICD-10-CM | POA: Diagnosis not present

## 2021-08-22 DIAGNOSIS — Z743 Need for continuous supervision: Secondary | ICD-10-CM | POA: Diagnosis not present

## 2021-08-22 DIAGNOSIS — I10 Essential (primary) hypertension: Secondary | ICD-10-CM | POA: Diagnosis present

## 2021-08-22 DIAGNOSIS — N183 Chronic kidney disease, stage 3 unspecified: Secondary | ICD-10-CM | POA: Insufficient documentation

## 2021-08-22 DIAGNOSIS — M810 Age-related osteoporosis without current pathological fracture: Secondary | ICD-10-CM | POA: Insufficient documentation

## 2021-08-22 DIAGNOSIS — I739 Peripheral vascular disease, unspecified: Secondary | ICD-10-CM | POA: Diagnosis present

## 2021-08-22 DIAGNOSIS — E785 Hyperlipidemia, unspecified: Secondary | ICD-10-CM | POA: Diagnosis not present

## 2021-08-22 DIAGNOSIS — R079 Chest pain, unspecified: Secondary | ICD-10-CM | POA: Diagnosis not present

## 2021-08-22 DIAGNOSIS — S0990XA Unspecified injury of head, initial encounter: Secondary | ICD-10-CM | POA: Diagnosis not present

## 2021-08-22 DIAGNOSIS — Z888 Allergy status to other drugs, medicaments and biological substances status: Secondary | ICD-10-CM

## 2021-08-22 DIAGNOSIS — J449 Chronic obstructive pulmonary disease, unspecified: Secondary | ICD-10-CM | POA: Diagnosis present

## 2021-08-22 DIAGNOSIS — Z83438 Family history of other disorder of lipoprotein metabolism and other lipidemia: Secondary | ICD-10-CM

## 2021-08-22 DIAGNOSIS — Z90722 Acquired absence of ovaries, bilateral: Secondary | ICD-10-CM

## 2021-08-22 DIAGNOSIS — I779 Disorder of arteries and arterioles, unspecified: Secondary | ICD-10-CM | POA: Insufficient documentation

## 2021-08-22 DIAGNOSIS — M8580 Other specified disorders of bone density and structure, unspecified site: Secondary | ICD-10-CM | POA: Diagnosis not present

## 2021-08-22 DIAGNOSIS — I7143 Infrarenal abdominal aortic aneurysm, without rupture: Secondary | ICD-10-CM | POA: Diagnosis not present

## 2021-08-22 DIAGNOSIS — H3530 Unspecified macular degeneration: Secondary | ICD-10-CM | POA: Diagnosis not present

## 2021-08-22 DIAGNOSIS — W19XXXA Unspecified fall, initial encounter: Secondary | ICD-10-CM

## 2021-08-22 DIAGNOSIS — R140 Abdominal distension (gaseous): Secondary | ICD-10-CM | POA: Diagnosis not present

## 2021-08-22 DIAGNOSIS — Z7982 Long term (current) use of aspirin: Secondary | ICD-10-CM | POA: Diagnosis not present

## 2021-08-22 DIAGNOSIS — R0902 Hypoxemia: Secondary | ICD-10-CM | POA: Insufficient documentation

## 2021-08-22 DIAGNOSIS — S2249XA Multiple fractures of ribs, unspecified side, initial encounter for closed fracture: Secondary | ICD-10-CM | POA: Diagnosis present

## 2021-08-22 DIAGNOSIS — Z87891 Personal history of nicotine dependence: Secondary | ICD-10-CM

## 2021-08-22 DIAGNOSIS — Z961 Presence of intraocular lens: Secondary | ICD-10-CM | POA: Diagnosis present

## 2021-08-22 DIAGNOSIS — M792 Neuralgia and neuritis, unspecified: Secondary | ICD-10-CM | POA: Insufficient documentation

## 2021-08-22 DIAGNOSIS — M109 Gout, unspecified: Secondary | ICD-10-CM | POA: Insufficient documentation

## 2021-08-22 DIAGNOSIS — R0781 Pleurodynia: Secondary | ICD-10-CM | POA: Diagnosis not present

## 2021-08-22 DIAGNOSIS — R6889 Other general symptoms and signs: Secondary | ICD-10-CM | POA: Diagnosis not present

## 2021-08-22 DIAGNOSIS — M4854XA Collapsed vertebra, not elsewhere classified, thoracic region, initial encounter for fracture: Secondary | ICD-10-CM | POA: Diagnosis present

## 2021-08-22 DIAGNOSIS — Z96652 Presence of left artificial knee joint: Secondary | ICD-10-CM | POA: Diagnosis present

## 2021-08-22 DIAGNOSIS — E611 Iron deficiency: Secondary | ICD-10-CM | POA: Insufficient documentation

## 2021-08-22 DIAGNOSIS — E538 Deficiency of other specified B group vitamins: Secondary | ICD-10-CM | POA: Insufficient documentation

## 2021-08-22 DIAGNOSIS — Z9079 Acquired absence of other genital organ(s): Secondary | ICD-10-CM

## 2021-08-22 LAB — COMPREHENSIVE METABOLIC PANEL
ALT: 10 U/L (ref 0–44)
AST: 17 U/L (ref 15–41)
Albumin: 3.1 g/dL — ABNORMAL LOW (ref 3.5–5.0)
Alkaline Phosphatase: 47 U/L (ref 38–126)
Anion gap: 6 (ref 5–15)
BUN: 21 mg/dL (ref 8–23)
CO2: 28 mmol/L (ref 22–32)
Calcium: 9.1 mg/dL (ref 8.9–10.3)
Chloride: 109 mmol/L (ref 98–111)
Creatinine, Ser: 1.12 mg/dL — ABNORMAL HIGH (ref 0.44–1.00)
GFR, Estimated: 46 mL/min — ABNORMAL LOW (ref >60)
Glucose, Bld: 127 mg/dL — ABNORMAL HIGH (ref 70–99)
Potassium: 4.3 mmol/L (ref 3.5–5.1)
Sodium: 143 mmol/L (ref 135–145)
Total Bilirubin: 0.3 mg/dL (ref 0.3–1.2)
Total Protein: 6.6 g/dL (ref 6.5–8.1)

## 2021-08-22 LAB — CBC WITH DIFFERENTIAL/PLATELET
Abs Immature Granulocytes: 0.01 10*3/uL (ref 0.00–0.07)
Basophils Absolute: 0 10*3/uL (ref 0.0–0.1)
Basophils Relative: 0 %
Eosinophils Absolute: 0.2 10*3/uL (ref 0.0–0.5)
Eosinophils Relative: 3 %
HCT: 36 % (ref 36.0–46.0)
Hemoglobin: 11.4 g/dL — ABNORMAL LOW (ref 12.0–15.0)
Immature Granulocytes: 0 %
Lymphocytes Relative: 20 %
Lymphs Abs: 1.6 10*3/uL (ref 0.7–4.0)
MCH: 29.8 pg (ref 26.0–34.0)
MCHC: 31.7 g/dL (ref 30.0–36.0)
MCV: 94.2 fL (ref 80.0–100.0)
Monocytes Absolute: 0.5 10*3/uL (ref 0.1–1.0)
Monocytes Relative: 6 %
Neutro Abs: 5.7 10*3/uL (ref 1.7–7.7)
Neutrophils Relative %: 71 %
Platelets: 215 10*3/uL (ref 150–400)
RBC: 3.82 MIL/uL — ABNORMAL LOW (ref 3.87–5.11)
RDW: 15.4 % (ref 11.5–15.5)
WBC: 8 10*3/uL (ref 4.0–10.5)
nRBC: 0 % (ref 0.0–0.2)

## 2021-08-22 MED ORDER — SODIUM CHLORIDE 0.9 % IV SOLN: 8.0000 mg | Freq: Four times a day (QID) | INTRAVENOUS | Status: DC | PRN

## 2021-08-22 MED ORDER — BISACODYL 10 MG RE SUPP: 10.0000 mg | Freq: Two times a day (BID) | RECTAL | Status: DC | PRN

## 2021-08-22 MED ORDER — ALBUMIN HUMAN 5 % IV SOLN: 12.5000 g | Freq: Four times a day (QID) | INTRAVENOUS | Status: DC | PRN

## 2021-08-22 MED ORDER — METHOCARBAMOL 500 MG PO TABS
500.0000 mg | ORAL_TABLET | Freq: Once | ORAL | Status: AC
  Administered 2021-08-22: 500 mg via ORAL
  Filled 2021-08-22: qty 1

## 2021-08-22 MED ORDER — ARTIFICIAL TEARS OPHTHALMIC OINT: TOPICAL_OINTMENT | Freq: Every day | OPHTHALMIC | Status: DC | PRN

## 2021-08-22 MED ORDER — POLYETHYLENE GLYCOL 3350 17 G PO PACK: 17.0000 g | PACK | Freq: Every day | ORAL | Status: DC | PRN

## 2021-08-22 MED ORDER — METHOCARBAMOL 1000 MG/10ML IJ SOLN: 250.0000 mg | Freq: Once | INTRAVENOUS | Status: DC

## 2021-08-22 MED ORDER — LIP MEDEX EX OINT
TOPICAL_OINTMENT | Freq: Two times a day (BID) | CUTANEOUS | Status: DC
  Administered 2021-08-23 – 2021-08-24 (×2): 75 via TOPICAL
  Filled 2021-08-22 (×2): qty 7

## 2021-08-22 MED ORDER — NAPHAZOLINE-GLYCERIN 0.012-0.25 % OP SOLN: 1.0000 [drp] | Freq: Four times a day (QID) | OPHTHALMIC | Status: DC | PRN

## 2021-08-22 MED ORDER — FENTANYL CITRATE PF 50 MCG/ML IJ SOSY: 50.0000 ug | PREFILLED_SYRINGE | Freq: Once | INTRAMUSCULAR | Status: DC

## 2021-08-22 MED ORDER — ACETAMINOPHEN 500 MG PO TABS
1000.0000 mg | ORAL_TABLET | Freq: Once | ORAL | Status: AC
  Administered 2021-08-23: 1000 mg via ORAL
  Filled 2021-08-22: qty 2

## 2021-08-22 MED ORDER — IRBESARTAN 150 MG PO TABS
150.0000 mg | ORAL_TABLET | Freq: Every day | ORAL | Status: DC
  Administered 2021-08-23 – 2021-08-27 (×5): 150 mg via ORAL
  Filled 2021-08-22 (×6): qty 1

## 2021-08-22 MED ORDER — ALUM & MAG HYDROXIDE-SIMETH 200-200-20 MG/5ML PO SUSP: 30.0000 mL | Freq: Four times a day (QID) | ORAL | Status: DC | PRN

## 2021-08-22 MED ORDER — SIMVASTATIN 20 MG PO TABS
40.0000 mg | ORAL_TABLET | Freq: Every evening | ORAL | Status: DC
  Administered 2021-08-23 – 2021-08-26 (×4): 40 mg via ORAL
  Filled 2021-08-22 (×4): qty 2

## 2021-08-22 MED ORDER — PHENOL 1.4 % MT LIQD: 2.0000 | OROMUCOSAL | Status: DC | PRN

## 2021-08-22 MED ORDER — METHOCARBAMOL 500 MG PO TABS
500.0000 mg | ORAL_TABLET | Freq: Four times a day (QID) | ORAL | Status: DC | PRN
  Administered 2021-08-23 (×2): 500 mg via ORAL
  Filled 2021-08-22 (×2): qty 1

## 2021-08-22 MED ORDER — METOPROLOL TARTRATE 5 MG/5ML IV SOLN: 5.0000 mg | Freq: Four times a day (QID) | INTRAVENOUS | Status: DC | PRN

## 2021-08-22 MED ORDER — TRAMADOL HCL 50 MG PO TABS
50.0000 mg | ORAL_TABLET | Freq: Four times a day (QID) | ORAL | Status: DC | PRN
  Administered 2021-08-23: 50 mg via ORAL
  Administered 2021-08-23: 100 mg via ORAL
  Filled 2021-08-22: qty 2
  Filled 2021-08-22: qty 1

## 2021-08-22 MED ORDER — FENTANYL CITRATE PF 50 MCG/ML IJ SOSY
50.0000 ug | PREFILLED_SYRINGE | Freq: Once | INTRAMUSCULAR | Status: AC
  Administered 2021-08-22: 50 ug via INTRAVENOUS
  Filled 2021-08-22: qty 1

## 2021-08-22 MED ORDER — MOMETASONE FURO-FORMOTEROL FUM 200-5 MCG/ACT IN AERO: 2.0000 | INHALATION_SPRAY | Freq: Two times a day (BID) | RESPIRATORY_TRACT | Status: DC

## 2021-08-22 MED ORDER — FENTANYL CITRATE PF 50 MCG/ML IJ SOSY
25.0000 ug | PREFILLED_SYRINGE | INTRAMUSCULAR | Status: DC | PRN
  Administered 2021-08-23: 25 ug via INTRAVENOUS
  Administered 2021-08-23: 50 ug via INTRAVENOUS
  Administered 2021-08-24: 25 ug via INTRAVENOUS
  Administered 2021-08-25: 50 ug via INTRAVENOUS
  Filled 2021-08-22 (×6): qty 1

## 2021-08-22 MED ORDER — METHOCARBAMOL 500 MG PO TABS: 1000.0000 mg | ORAL_TABLET | Freq: Four times a day (QID) | ORAL | Status: DC | PRN

## 2021-08-22 MED ORDER — DIPHENHYDRAMINE HCL 50 MG/ML IJ SOLN: 12.5000 mg | Freq: Four times a day (QID) | INTRAMUSCULAR | Status: DC | PRN

## 2021-08-22 MED ORDER — LIDOCAINE 5 % EX PTCH
1.0000 | MEDICATED_PATCH | CUTANEOUS | Status: DC
  Administered 2021-08-23 – 2021-08-24 (×3): 1 via TRANSDERMAL
  Filled 2021-08-22 (×3): qty 1

## 2021-08-22 MED ORDER — PROCHLORPERAZINE EDISYLATE 10 MG/2ML IJ SOLN: 5.0000 mg | INTRAMUSCULAR | Status: DC | PRN

## 2021-08-22 MED ORDER — POLYETHYL GLYCOL-PROPYL GLYCOL 0.4-0.3 % OP GEL: Freq: Every day | OPHTHALMIC | Status: DC | PRN

## 2021-08-22 MED ORDER — MAGIC MOUTHWASH: 15.0000 mL | Freq: Four times a day (QID) | ORAL | Status: DC | PRN

## 2021-08-22 MED ORDER — METHOCARBAMOL 1000 MG/10ML IJ SOLN: 1000.0000 mg | Freq: Four times a day (QID) | INTRAVENOUS | Status: DC | PRN

## 2021-08-22 MED ORDER — METHOCARBAMOL 1000 MG/10ML IJ SOLN: 500.0000 mg | Freq: Four times a day (QID) | INTRAVENOUS | Status: DC | PRN

## 2021-08-22 MED ORDER — GUAIFENESIN-DM 100-10 MG/5ML PO SYRP: 15.0000 mL | ORAL_SOLUTION | ORAL | Status: DC | PRN

## 2021-08-22 MED ORDER — MENTHOL 3 MG MT LOZG: 1.0000 | LOZENGE | OROMUCOSAL | Status: DC | PRN

## 2021-08-22 MED ORDER — ASPIRIN 81 MG PO TBEC
81.0000 mg | DELAYED_RELEASE_TABLET | Freq: Every day | ORAL | Status: DC
  Administered 2021-08-23 – 2021-08-27 (×5): 81 mg via ORAL
  Filled 2021-08-22 (×5): qty 1

## 2021-08-22 MED ORDER — ACETAMINOPHEN 500 MG PO TABS
1000.0000 mg | ORAL_TABLET | Freq: Four times a day (QID) | ORAL | Status: DC
  Administered 2021-08-23 – 2021-08-27 (×17): 1000 mg via ORAL
  Filled 2021-08-22 (×17): qty 2

## 2021-08-22 MED ORDER — ONDANSETRON HCL 4 MG/2ML IJ SOLN: 4.0000 mg | Freq: Four times a day (QID) | INTRAMUSCULAR | Status: DC | PRN

## 2021-08-22 MED ORDER — POLYETHYLENE GLYCOL 3350 17 G PO PACK
17.0000 g | PACK | Freq: Two times a day (BID) | ORAL | Status: DC
  Administered 2021-08-23 – 2021-08-26 (×6): 17 g via ORAL
  Filled 2021-08-22 (×8): qty 1

## 2021-08-22 MED ORDER — SALINE SPRAY 0.65 % NA SOLN: 1.0000 | Freq: Four times a day (QID) | NASAL | Status: DC | PRN

## 2021-08-22 NOTE — ED Provider Notes (Signed)
Care assumed at 2300.  Patient here for evaluation of right-sided rib pain after a fall.  Labs are near her baseline.  She has no abdominal tenderness on examination but does have significant right-sided chest wall tenderness, splinting respirations.  Plan to admit to medicine service for ongoing care.  Medicine consulted for admission.   Tilden Fossa, MD 08/23/21 [phone]

## 2021-08-22 NOTE — ED Provider Notes (Signed)
[facility]-EMERGENCY DEPT Provider Note   CSN: [id number] Arrival date & time: 08/22/21  2032     History  Chief Complaint  Patient presents with   Fall   Rib Injury    Vicki Hernandez is a 88 y.o. female.  Presenting to ER due to concern for fall, rib injury.  Patient reports that she was taking trash bag out of trash can when she lost her balance, struck her right ribs on a counter.  She did not fall to the ground.  She did not hit her head.  She denies any pain in her neck or back.  She is having severe pain on the right side/lateral back area.  No pain in her abdomen.  No vomiting or nausea.  Not on blood thinners.  HPI     Home Medications Prior to Admission medications   Medication Sig Start Date End Date Taking? Authorizing Provider  aspirin EC 81 MG tablet Take 1 tablet (81 mg total) by mouth daily. 01/25/18   Corky Crafts, MD  cyanocobalamin (,VITAMIN B-12,) 1000 MCG/ML injection SMARTSIG:1 Milliliter(s) Injection Once a Month 01/02/20   [provider]  esomeprazole (NEXIUM) 20 MG capsule Take 20 mg by mouth daily at 12 noon.    [provider]  isosorbide mononitrate (IMDUR) 30 MG 24 hr tablet Take 0.5 tablets (15 mg total) by mouth daily. Please make yearly appt with Dr. Eldridge Dace for March 2023 for future refills. Thank you 1st attempt 02/16/21   Corky Crafts, MD  metoprolol tartrate (LOPRESSOR) 25 MG tablet TAKE 1/2 TABLET TWICE DAILY, MAY TAKE AN EXTRA 1 TABLET AS NEEDED FOR PALPITATIONS 04/04/14   Corky Crafts, MD  Multiple Vitamins-Minerals (EYE-VITES PO) Take 1 tablet by mouth daily.     [provider]  nitroGLYCERIN (NITROSTAT) 0.4 MG SL tablet Place 1 tablet (0.4 mg total) under the tongue every 5 (five) minutes as needed for chest pain. 03/18/20 06/16/20  Corky Crafts, MD  Polyethyl Glycol-Propyl Glycol (SYSTANE OP) Place 1-2 drops into both eyes daily as needed. For dry eyes    [provider]  polyethylene glycol (MIRALAX / GLYCOLAX) packet Take 17 g by mouth daily as needed for mild constipation or moderate constipation.    [provider]  simvastatin (ZOCOR) 40 MG tablet Take 1 tablet (40 mg total) by mouth every evening. 03/19/18   Corky Crafts, MD  [facility] 160-4.5 MCG/ACT inhaler  02/14/17   [provider]  telmisartan (MICARDIS) 40 MG tablet Take 40 mg by mouth daily. 03/21/19   [provider]  VITAMIN D, ERGOCALCIFEROL, PO Take 1 capsule by mouth every morning.     [provider]      Allergies    Sulfa antibiotics, Codeine, Cymbalta [duloxetine hcl], and Fosamax [alendronate]    Review of Systems   Review of Systems  Constitutional:  Negative for chills and fever.  HENT:  Negative for ear pain and sore throat.   Eyes:  Negative for pain and visual disturbance.  Respiratory:  Negative for cough and shortness of breath.   Cardiovascular:  Positive for chest pain. Negative for palpitations.  Gastrointestinal:  Negative for abdominal pain and vomiting.  Genitourinary:  Negative for dysuria and hematuria.  Musculoskeletal:  Negative for arthralgias and back pain.  Skin:  Negative for color change and rash.  Neurological:  Negative for seizures and syncope.  All other systems reviewed and are negative.   Physical Exam  Updated Vital Signs BP (!) 191/79 (BP Location: Right Arm)   Pulse 86   Temp 98.4 F (36.9 C) (Oral)   Resp 16   Ht 5' (1.524 m)   Wt 53.5 kg   SpO2 95%   BMI 23.05 kg/m  Physical Exam Vitals and nursing note reviewed.  Constitutional:      General: She is not in acute distress.    Appearance: She is well-developed.  HENT:     Head: Normocephalic and atraumatic.  Eyes:     Conjunctiva/sclera: Conjunctivae normal.  Cardiovascular:     Rate and Rhythm: Normal rate and regular rhythm.     Heart sounds: No murmur heard. Pulmonary:     Effort: Pulmonary effort is normal. No respiratory  distress.     Breath sounds: Normal breath sounds.  Chest:     Comments: Tenderness palpation of her right lateral chest wall Abdominal:     Palpations: Abdomen is soft.     Tenderness: There is no abdominal tenderness.  Musculoskeletal:        General: No swelling.     Cervical back: Neck supple.     Comments: Back: no C, T, L spine TTP, no step off or deformity RUE: no TTP throughout, no deformity, normal joint ROM, radial pulse intact, distal sensation and motor intact LUE: no TTP throughout, no deformity, normal joint ROM, radial pulse intact, distal sensation and motor intact RLE:  no TTP throughout, no deformity, normal joint ROM, distal pulse, sensation and motor intact LLE: no TTP throughout, no deformity, normal joint ROM, distal pulse, sensation and motor intact  Skin:    General: Skin is warm and dry.     Capillary Refill: Capillary refill takes less than 2 seconds.  Neurological:     Mental Status: She is alert.  Psychiatric:        Mood and Affect: Mood normal.     ED Results / Procedures / Treatments   Labs (all labs ordered are listed, but only abnormal results are displayed) Labs Reviewed  CBC WITH DIFFERENTIAL/PLATELET - Abnormal; Notable for the following components:      Result Value   RBC 3.82 (*)    Hemoglobin 11.4 (*)    All other components within normal limits  COMPREHENSIVE METABOLIC PANEL  CBC  BASIC METABOLIC PANEL    EKG None  Radiology DG Ribs Unilateral W/Chest Right  Result Date: 08/22/2021 CLINICAL DATA:  Right rib pain EXAM: RIGHT RIBS AND CHEST - 3+ VIEW COMPARISON:  None Available. FINDINGS: There is an acute minimally displaced fracture of the right 7 and 8 ribs laterally. Lungs are clear. No pneumothorax or pleural effusion. Cardiac size within normal limits. Pulmonary vascularity is normal. IMPRESSION: Acute minimally displaced fractures of the right seventh and eighth ribs laterally. No pneumothorax. Electronically Signed   By: Helyn Numbers M.D.   On: 08/22/2021 22:11    Procedures Procedures    Medications Ordered in ED Medications  fentaNYL (SUBLIMAZE) injection 50 mcg (50 mcg Intravenous Not Given 08/22/21 2252)  acetaminophen (TYLENOL) tablet 1,000 mg (has no administration in time range)  albumin human 5 % solution 12.5 g (has no administration in time range)  acetaminophen (TYLENOL) tablet 1,000 mg (has no administration in time range)  traMADol (ULTRAM) tablet 50-100 mg (has no administration in time range)  fentaNYL (SUBLIMAZE) injection 25-50 mcg (has no administration in time range)  ondansetron (ZOFRAN) injection 4 mg (has no administration in time range)    Or  ondansetron (ZOFRAN) 8 mg in sodium chloride 0.9 % 50 mL IVPB (has no administration in time range)  prochlorperazine (COMPAZINE) injection 5-10 mg (has no administration in time range)  lip balm (CARMEX) ointment (has no administration in time range)  phenol (CHLORASEPTIC) mouth spray 2 spray (has no administration in time range)  menthol-cetylpyridinium (CEPACOL) lozenge 3 mg (has no administration in time range)  magic mouthwash (has no administration in time range)  alum & mag hydroxide-simeth (MAALOX/MYLANTA) 200-200-20 MG/5ML suspension 30 mL (has no administration in time range)  polyethylene glycol (MIRALAX / GLYCOLAX) packet 17 g (has no administration in time range)  bisacodyl (DULCOLAX) suppository 10 mg (has no administration in time range)  diphenhydrAMINE (BENADRYL) injection 12.5-25 mg (has no administration in time range)  guaiFENesin-dextromethorphan (ROBITUSSIN DM) 100-10 MG/5ML syrup 15 mL (has no administration in time range)  sodium chloride (OCEAN) 0.65 % nasal spray 1-2 spray (has no administration in time range)  naphazoline-glycerin (CLEAR EYES REDNESS) ophth solution 1-2 drop (has no administration in time range)  metoprolol tartrate (LOPRESSOR) injection 5 mg (has no administration in time range)  methocarbamol (ROBAXIN)  500 mg in dextrose 5 % 50 mL IVPB (has no administration in time range)  methocarbamol (ROBAXIN) tablet 500 mg (has no administration in time range)  aspirin EC tablet 81 mg (has no administration in time range)  polyethylene glycol 0.4% and propylene glycol 0.3% (SYSTANE) ophthalmic gel (has no administration in time range)  polyethylene glycol (MIRALAX / GLYCOLAX) packet 17 g (has no administration in time range)  simvastatin (ZOCOR) tablet 40 mg (has no administration in time range)  mometasone-formoterol (DULERA) 200-5 MCG/ACT inhaler 2 puff (has no administration in time range)  irbesartan (AVAPRO) tablet 150 mg (has no administration in time range)  fentaNYL (SUBLIMAZE) injection 50 mcg (50 mcg Intravenous Given 08/22/21 2209)  methocarbamol (ROBAXIN) tablet 500 mg (500 mg Oral Given 08/22/21 2208)    ED Course/ Medical Decision Making/ A&P                           Medical Decision Making Amount and/or Complexity of Data Reviewed Labs: ordered. Radiology: ordered.  Risk OTC drugs. Prescription drug management.   88 year old lady presenting to the emergency room due to concern for right chest wall injury.  Describes blunt trauma type injury to right chest.  Has focal tenderness over her right lateral ribs.  No tenderness in her abdomen.  No visible ecchymosis or abrasions noted.  X-ray concerning for 2 nondisplaced rib fractures.  Patient received fentanyl with EMS, provided her some additional fentanyl here.  On reassessment, still having ongoing pain.  Feel she would benefit from admission for pain control, pulmonary toilet, further observation.  I consulted trauma surgery, discussed the case with Dr. Michaell Cowing who answered the call, he states that patient should be admitted to the hospitalist service and admitted at [facility] if possible and the trauma team can consult on patient.  He will notify [facility] trauma team to request them to evaluate likely tomorrow morning.  We will check some  basic labs and consult to hospitalist for admit.  While awaiting basic lab work, signed out to Dr. Madilyn Hook.  She will call [facility] once labs have resulted.         Final Clinical Impression(s) / ED Diagnoses Final diagnoses:  Closed fracture of multiple ribs of right side, initial encounter    Rx / DC Orders ED Discharge Orders  None         Milagros Loll, MD 08/22/21 [phone]

## 2021-08-22 NOTE — ED Triage Notes (Signed)
Patient BIB EMS after falling into counter and hitting R rib cage.  Reports she was taking the trash bag out of the trash can and loss her balance.  She did not fall to floor.  States pain starts in R back and radiates into R rib cage.  Given Fentanyl 100 mcg IV PTA.

## 2021-08-22 NOTE — ED Notes (Signed)
Patient returned from CT

## 2021-08-22 NOTE — ED Notes (Signed)
Patient provided with phone to update husband

## 2021-08-23 ENCOUNTER — Encounter (HOSPITAL_COMMUNITY): Payer: Self-pay | Admitting: Internal Medicine

## 2021-08-23 ENCOUNTER — Observation Stay (HOSPITAL_COMMUNITY): Payer: Medicare Other

## 2021-08-23 DIAGNOSIS — W19XXXA Unspecified fall, initial encounter: Secondary | ICD-10-CM

## 2021-08-23 DIAGNOSIS — M8580 Other specified disorders of bone density and structure, unspecified site: Secondary | ICD-10-CM | POA: Diagnosis not present

## 2021-08-23 DIAGNOSIS — K6389 Other specified diseases of intestine: Secondary | ICD-10-CM | POA: Diagnosis not present

## 2021-08-23 DIAGNOSIS — I10 Essential (primary) hypertension: Secondary | ICD-10-CM | POA: Diagnosis not present

## 2021-08-23 DIAGNOSIS — M1990 Unspecified osteoarthritis, unspecified site: Secondary | ICD-10-CM | POA: Diagnosis not present

## 2021-08-23 DIAGNOSIS — I739 Peripheral vascular disease, unspecified: Secondary | ICD-10-CM | POA: Diagnosis not present

## 2021-08-23 DIAGNOSIS — M4854XA Collapsed vertebra, not elsewhere classified, thoracic region, initial encounter for fracture: Secondary | ICD-10-CM | POA: Diagnosis not present

## 2021-08-23 DIAGNOSIS — N1831 Chronic kidney disease, stage 3a: Secondary | ICD-10-CM | POA: Diagnosis not present

## 2021-08-23 DIAGNOSIS — Z66 Do not resuscitate: Secondary | ICD-10-CM | POA: Diagnosis not present

## 2021-08-23 DIAGNOSIS — R140 Abdominal distension (gaseous): Secondary | ICD-10-CM | POA: Diagnosis not present

## 2021-08-23 DIAGNOSIS — Z7951 Long term (current) use of inhaled steroids: Secondary | ICD-10-CM | POA: Diagnosis not present

## 2021-08-23 DIAGNOSIS — H3530 Unspecified macular degeneration: Secondary | ICD-10-CM | POA: Diagnosis not present

## 2021-08-23 DIAGNOSIS — Z888 Allergy status to other drugs, medicaments and biological substances status: Secondary | ICD-10-CM | POA: Diagnosis not present

## 2021-08-23 DIAGNOSIS — I7143 Infrarenal abdominal aortic aneurysm, without rupture: Secondary | ICD-10-CM | POA: Diagnosis not present

## 2021-08-23 DIAGNOSIS — Y92009 Unspecified place in unspecified non-institutional (private) residence as the place of occurrence of the external cause: Secondary | ICD-10-CM | POA: Diagnosis not present

## 2021-08-23 DIAGNOSIS — S2241XA Multiple fractures of ribs, right side, initial encounter for closed fracture: Secondary | ICD-10-CM | POA: Diagnosis present

## 2021-08-23 DIAGNOSIS — Q630 Accessory kidney: Secondary | ICD-10-CM | POA: Diagnosis not present

## 2021-08-23 DIAGNOSIS — I129 Hypertensive chronic kidney disease with stage 1 through stage 4 chronic kidney disease, or unspecified chronic kidney disease: Secondary | ICD-10-CM | POA: Diagnosis not present

## 2021-08-23 DIAGNOSIS — W01198A Fall on same level from slipping, tripping and stumbling with subsequent striking against other object, initial encounter: Secondary | ICD-10-CM | POA: Diagnosis not present

## 2021-08-23 DIAGNOSIS — Z9079 Acquired absence of other genital organ(s): Secondary | ICD-10-CM | POA: Diagnosis not present

## 2021-08-23 DIAGNOSIS — Z7982 Long term (current) use of aspirin: Secondary | ICD-10-CM | POA: Diagnosis not present

## 2021-08-23 DIAGNOSIS — R0789 Other chest pain: Secondary | ICD-10-CM | POA: Diagnosis not present

## 2021-08-23 DIAGNOSIS — J449 Chronic obstructive pulmonary disease, unspecified: Secondary | ICD-10-CM

## 2021-08-23 DIAGNOSIS — R1011 Right upper quadrant pain: Secondary | ICD-10-CM | POA: Diagnosis not present

## 2021-08-23 DIAGNOSIS — Z882 Allergy status to sulfonamides status: Secondary | ICD-10-CM | POA: Diagnosis not present

## 2021-08-23 DIAGNOSIS — K219 Gastro-esophageal reflux disease without esophagitis: Secondary | ICD-10-CM | POA: Diagnosis not present

## 2021-08-23 DIAGNOSIS — Z90710 Acquired absence of both cervix and uterus: Secondary | ICD-10-CM | POA: Diagnosis not present

## 2021-08-23 DIAGNOSIS — Z79899 Other long term (current) drug therapy: Secondary | ICD-10-CM | POA: Diagnosis not present

## 2021-08-23 DIAGNOSIS — E785 Hyperlipidemia, unspecified: Secondary | ICD-10-CM | POA: Diagnosis not present

## 2021-08-23 DIAGNOSIS — Z885 Allergy status to narcotic agent status: Secondary | ICD-10-CM | POA: Diagnosis not present

## 2021-08-23 LAB — BASIC METABOLIC PANEL
Anion gap: 6 (ref 5–15)
BUN: 18 mg/dL (ref 8–23)
CO2: 29 mmol/L (ref 22–32)
Calcium: 9.2 mg/dL (ref 8.9–10.3)
Chloride: 104 mmol/L (ref 98–111)
Creatinine, Ser: 1.03 mg/dL — ABNORMAL HIGH (ref 0.44–1.00)
GFR, Estimated: 51 mL/min — ABNORMAL LOW (ref >60)
Glucose, Bld: 119 mg/dL — ABNORMAL HIGH (ref 70–99)
Potassium: 4 mmol/L (ref 3.5–5.1)
Sodium: 139 mmol/L (ref 135–145)

## 2021-08-23 LAB — CBC
HCT: 38.7 % (ref 36.0–46.0)
Hemoglobin: 12.4 g/dL (ref 12.0–15.0)
MCH: 29.3 pg (ref 26.0–34.0)
MCHC: 32 g/dL (ref 30.0–36.0)
MCV: 91.5 fL (ref 80.0–100.0)
Platelets: 226 10*3/uL (ref 150–400)
RBC: 4.23 MIL/uL (ref 3.87–5.11)
RDW: 15.1 % (ref 11.5–15.5)
WBC: 7.2 10*3/uL (ref 4.0–10.5)
nRBC: 0 % (ref 0.0–0.2)

## 2021-08-23 MED ORDER — FLUTICASONE FUROATE-VILANTEROL 100-25 MCG/ACT IN AEPB
1.0000 | INHALATION_SPRAY | Freq: Every day | RESPIRATORY_TRACT | Status: DC
Start: 2021-08-23 — End: 2021-08-27
  Administered 2021-08-23 – 2021-08-27 (×5): 1 via RESPIRATORY_TRACT
  Filled 2021-08-23: qty 28

## 2021-08-23 MED ORDER — ACETAMINOPHEN 325 MG PO TABS
650.0000 mg | ORAL_TABLET | Freq: Four times a day (QID) | ORAL | Status: DC | PRN
  Administered 2021-08-27: 650 mg via ORAL
  Filled 2021-08-23: qty 2

## 2021-08-23 MED ORDER — UMECLIDINIUM BROMIDE 62.5 MCG/ACT IN AEPB
1.0000 | INHALATION_SPRAY | Freq: Every day | RESPIRATORY_TRACT | Status: DC
  Administered 2021-08-23 – 2021-08-27 (×5): 1 via RESPIRATORY_TRACT
  Filled 2021-08-23: qty 7

## 2021-08-23 MED ORDER — HEPARIN SODIUM (PORCINE) 5000 UNIT/ML IJ SOLN
5000.0000 [IU] | Freq: Three times a day (TID) | INTRAMUSCULAR | Status: DC
  Administered 2021-08-23 – 2021-08-24 (×4): 5000 [IU] via SUBCUTANEOUS
  Filled 2021-08-23 (×4): qty 1

## 2021-08-23 MED ORDER — DOCUSATE SODIUM 100 MG PO CAPS
100.0000 mg | ORAL_CAPSULE | Freq: Two times a day (BID) | ORAL | Status: DC
  Administered 2021-08-23 – 2021-08-26 (×6): 100 mg via ORAL
  Filled 2021-08-23 (×8): qty 1

## 2021-08-23 MED ORDER — ACETAMINOPHEN 650 MG RE SUPP: 650.0000 mg | Freq: Four times a day (QID) | RECTAL | Status: DC | PRN

## 2021-08-23 NOTE — Progress Notes (Signed)
Transition of Care [facility]) - CAGE-AID Screening   Patient Details  Name: Vicki Hernandez MRN: [id number] Date of Birth: [birth year]   Hewitt Shorts, RN Trauma Response Nurse Phone Number: [phone] 08/23/2021, 2:00 PM       CAGE-AID Screening:    Have You Ever Felt You Ought to Cut Down on Your Drinking or Drug Use?: No Have People Annoyed You By Critizing Your Drinking Or Drug Use?: No Have You Felt Bad Or Guilty About Your Drinking Or Drug Use?: No Have You Ever Had a Drink or Used Drugs First Thing In The Morning to Steady Your Nerves or to Get Rid of a Hangover?: No CAGE-AID Score: 0  Substance Abuse Education Offered: No ("we may have some wine every couple of months-")

## 2021-08-23 NOTE — Evaluation (Signed)
Occupational Therapy Evaluation Patient Details Name: Vicki Hernandez MRN: [id number] DOB: 25-May-1928 Today's Date: 08/23/2021   History of Present Illness Pt is a 88 y/o female presenting after a fall. Found with R rib fxs (7-8). PMH includes: OA, Afib, HTN, endarterectomy, prior spine surgery.   Clinical Impression   PTA patient independent with ADLs and mobility, helps care for her spouse who is wc bound.  Admitted for above and presents with problem list below, including impaired balance, pain, and decreased functional ROM of R UE.  She currently requires setup to mod assist for ADLs, min assist for transfers and limited mobility using RW. Based on performance today, believe she will benefit from further acute OT services and after dc at [facility] level to optimize independence, safety and return to PLOF. Will follow.      Recommendations for follow up therapy are one component of a multi-disciplinary discharge planning process, led by the attending physician.  Recommendations may be updated based on patient status, additional functional criteria and insurance authorization.   Follow Up Recommendations  Home health OT    Assistance Recommended at Discharge Frequent or constant Supervision/Assistance  Patient can return home with the following A little help with walking and/or transfers;A lot of help with bathing/dressing/bathroom;Assistance with cooking/housework;Assist for transportation;Help with stairs or ramp for entrance    Functional Status Assessment  Patient has had a recent decline in their functional status and demonstrates the ability to make significant improvements in function in a reasonable and predictable amount of time.  Equipment Recommendations  BSC/3in1    Recommendations for Other Services       Precautions / Restrictions Precautions Precautions: Fall Restrictions Weight Bearing Restrictions: No      Mobility Bed Mobility               General bed  mobility comments: OOB in recliner upon entry    Transfers Overall transfer level: Needs assistance Equipment used: Rolling walker (2 wheels) Transfers: Sit to/from Stand, Bed to chair/wheelchair/BSC Sit to Stand: Min assist           General transfer comment: cueing for hand placement and safety, min assist to power up      Balance Overall balance assessment: Needs assistance Sitting-balance support: Bilateral upper extremity supported Sitting balance-Leahy Scale: Fair     Standing balance support: During functional activity, Reliant on assistive device for balance, Bilateral upper extremity supported Standing balance-Leahy Scale: Poor                             ADL either performed or assessed with clinical judgement   ADL Overall ADL's : Needs assistance/impaired     Grooming: Set up;Sitting           Upper Body Dressing : Minimal assistance;Sitting   Lower Body Dressing: Moderate assistance;Sit to/from stand Lower Body Dressing Details (indicate cue type and reason): limited ability to manage socks due to pain, min assist in standing Toilet Transfer: Minimal assistance;Ambulation;Rolling walker (2 wheels) Toilet Transfer Details (indicate cue type and reason): simulated in room         Functional mobility during ADLs: Minimal assistance;Rolling walker (2 wheels)       Vision   Vision Assessment?: No apparent visual deficits     Perception     Praxis      Pertinent Vitals/Pain Pain Assessment Pain Assessment: Faces Faces Pain Scale: Hurts whole lot Pain Location: R side of back  at ribs Pain Descriptors / Indicators: Sharp, Moaning, Grimacing, Guarding Pain Intervention(s): Limited activity within patient's tolerance, Monitored during session, Repositioned, Ice applied     Hand Dominance Right   Extremity/Trunk Assessment Upper Extremity Assessment Upper Extremity Assessment: Generalized weakness;RUE deficits/detail RUE Deficits  / Details: limited shoulder flexion to 60* due to pain RUE Coordination: decreased gross motor   Lower Extremity Assessment Lower Extremity Assessment: Defer to PT evaluation       Communication Communication Communication: HOH   Cognition Arousal/Alertness: Awake/alert Behavior During Therapy: WFL for tasks assessed/performed Overall Cognitive Status: Within Functional Limits for tasks assessed                                       General Comments  VSS    Exercises     Shoulder Instructions      Home Living Family/patient expects to be discharged to:: Private residence Living Arrangements: Spouse/significant other Available Help at Discharge: Available PRN/intermittently;Friend(s) Type of Home: Independent living facility ([facility]) Home Access: Elevator     Home Layout: One level     Bathroom Shower/Tub: Walk-in Soil scientist Toilet: Handicapped height Bathroom Accessibility: Yes   Home Equipment: Electric scooter;Grab bars - tub/shower;Grab bars - toilet;Rollator (4 wheels);Cane - single Librarian, academic (2 wheels)          Prior Functioning/Environment Prior Level of Function : Independent/Modified Independent             Mobility Comments: Does not AD. Uses her electric scooter for long distances. ADLs Comments: Pt helps her husband get dressed and get in and out of shower. Pt is independent with bathing, cooking, and dressing. Pt has maid come to complete housework.        OT Problem List: Decreased strength;Decreased activity tolerance;Decreased range of motion;Impaired balance (sitting and/or standing);Decreased knowledge of use of DME or AE;Decreased knowledge of precautions;Pain;Impaired UE functional use      OT Treatment/Interventions: Self-care/ADL training;Therapeutic exercise;DME and/or AE instruction;Therapeutic activities;Patient/family education;Balance training;Energy conservation    OT Goals(Current  goals can be found in the care plan section) Acute Rehab OT Goals Patient Stated Goal: less pain OT Goal Formulation: With patient Time For Goal Achievement: 09/06/21 Potential to Achieve Goals: Good  OT Frequency: Min 2X/week    Co-evaluation              AM-PAC OT "6 Clicks" Daily Activity     Outcome Measure Help from another person eating meals?: A Little Help from another person taking care of personal grooming?: A Little Help from another person toileting, which includes using toliet, bedpan, or urinal?: A Little Help from another person bathing (including washing, rinsing, drying)?: A Lot Help from another person to put on and taking off regular upper body clothing?: A Little Help from another person to put on and taking off regular lower body clothing?: A Lot 6 Click Score: 16   End of Session Equipment Utilized During Treatment: Rolling walker (2 wheels) Nurse Communication: Mobility status  Activity Tolerance: Patient limited by pain Patient left: in chair;with call bell/phone within reach;with chair alarm set  OT Visit Diagnosis: Other abnormalities of gait and mobility (R26.89);Pain;Muscle weakness (generalized) (M62.81)                Time: 1914-7829 OT Time Calculation (min): 21 min Charges:  OT General Charges $OT Visit: 1 Visit OT Evaluation $OT Eval Moderate  Complexity: Wiley Ford, OT Acute Rehabilitation Services Office [phone]   Delight Stare 08/23/2021, 12:03 PM

## 2021-08-23 NOTE — TOC Initial Note (Signed)
Transition of Care [facility]) - Initial/Assessment Note    Patient Details  Name: Vicki Hernandez MRN: [id number] Date of Birth: 22-Jun-1928  Transition of Care [facility]) CM/SW Contact:    Kingsley Plan, RN Phone Number: 08/23/2021, 12:14 PM  Clinical Narrative:                 Spoke to patient and daughter Vicki Hernandez at bedside. PT recommendation HHPT pending progression. Patient from [facility] . Daughter states she lives with her husband who is dependent care and patient will need SNF at discharge. Preference SNF at [facility] if not available wants [facility]. Discussed with SW.     Barriers to Discharge: Continued Medical Work up   Patient Goals and CMS Choice        Expected Discharge Plan and Services     Discharge Planning Services: CM Consult   Living arrangements for the past 2 months: Apartment                   DME Agency: NA       HH Arranged: NA          Prior Living Arrangements/Services Living arrangements for the past 2 months: Apartment Lives with:: Spouse Patient language and need for interpreter reviewed:: Yes        Need for Family Participation in Patient Care: Yes (Comment) Care giver support system in place?: No (comment)   Criminal Activity/Legal Involvement Pertinent to Current Situation/Hospitalization: No - Comment as needed  Activities of Daily Living Home Assistive Devices/Equipment: None ADL Screening (condition at time of admission) Patient's cognitive ability adequate to safely complete daily activities?: No Is the patient deaf or have difficulty hearing?: Yes Does the patient have difficulty seeing, even when wearing glasses/contacts?: No Does the patient have difficulty concentrating, remembering, or making decisions?: No Patient able to express need for assistance with ADLs?: Yes Does the patient have difficulty dressing or bathing?: No Independently performs ADLs?: Yes (appropriate for developmental  age) Does the patient have difficulty walking or climbing stairs?: No Weakness of Legs: None Weakness of Arms/Hands: None  Permission Sought/Granted   Permission granted to share information with : Yes, Verbal Permission Granted  Share Information with NAME: daughter Vicki Hernandez           Emotional Assessment Appearance:: Appears stated age       Alcohol / Substance Use: Not Applicable Psych Involvement: No (comment)  Admission diagnosis:  Closed fracture of multiple ribs of right side, initial encounter [S22.41XA] Multiple fractures of ribs, right side, initial encounter for closed fracture [S22.41XA] Patient Active Problem List   Diagnosis Date Noted   Multiple fractures of ribs, right side, initial encounter for closed fracture 08/23/2021   Carotid artery disease (HCC) 08/22/2021   Gout 08/22/2021   Chronic kidney disease, stage 3 unspecified (HCC) 08/22/2021   Chronic obstructive pulmonary disease, unspecified (HCC) 08/22/2021   Hardening of the aorta (main artery of the heart) (HCC) 08/22/2021   Gastroesophageal reflux disease 08/22/2021   Iron deficiency 08/22/2021   Neuralgia 08/22/2021   Osteoarthritis 08/22/2021   Osteoporosis 08/22/2021   Pure hypercholesterolemia 08/22/2021   Vitamin B12 deficiency 08/22/2021   Hypertension 08/22/2021   Fall 08/22/2021   Multiple rib fractures - right 7-8 08/22/2021   Primary osteoarthritis of both hands 06/07/2016   Primary osteoarthritis of both knees 06/07/2016   DDD (degenerative disc disease), cervical 06/07/2016   DDD (degenerative disc disease), lumbar 06/07/2016   History of  macular degeneration 06/07/2016   History of total left knee replacement (TKR) 06/07/2016   S/P cervical spinal fusion 06/07/2016   History of hypertension 03/30/2016   History of gastroesophageal reflux (GERD) 03/30/2016   Age-related osteoporosis without current pathological fracture 03/30/2016   Intermediate stage nonexudative  age-related macular degeneration of left eye 07/30/2015   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   SVT (supraventricular tachycardia) (HCC) 11/29/2012   Essential hypertension, benign 11/29/2012   Tobacco use disorder 11/29/2012   PVD (peripheral vascular disease) (HCC) 11/29/2012   Amblyopia 10/20/2011   Nonexudative age-related macular degeneration 10/20/2011   Status post intraocular lens implant 10/20/2011   Aftercare following surgery of the circulatory system, NEC 10/18/2011   Other symptoms involving cardiovascular system 04/05/2011   Occlusion and stenosis of carotid artery 03/21/2011   PCP:  Daisy Floro, MD Pharmacy:   [id number] [place], [facility] - [address] [place] [address] Phone: [phone] Fax: [phone]  [facility] - [place], [place] - [address] [place] [address] Phone: [phone] Fax: [phone]     Social Determinants of Health (SDOH) Interventions    Readmission Risk Interventions     No data to display

## 2021-08-23 NOTE — Subjective & Objective (Signed)
CC: pain right rib cage HPI: 88 year old female history of COPD, hypertension, CKD stage IIIa, reflux presents to the ER today after a fall at home.  Patient was try to take the garbage bag out of the garbage can.  She lost her balance.  She struck the right side of her rib cage against the edge of the counter.  She had immediate pain.  Patient brought to ER by EMS.  Rib imaging demonstrates acute fracture of the right seventh and eighth ribs.  No pneumothorax.  EDP discussed the case with trauma surgery Dr. Michaell Cowing.  He recommended patient be transferred under the hospitalist service to [facility].  Trauma service will see her in the morning.  [facility] hospitalist contacted for admission.

## 2021-08-23 NOTE — Assessment & Plan Note (Signed)
Observation med/surg bed. Admit to [facility] per Dr. Michaell Cowing' request. Trauma service to see tomorrow. Continue with IV fentanyl for pain control.

## 2021-08-23 NOTE — Assessment & Plan Note (Signed)
As above.

## 2021-08-23 NOTE — Progress Notes (Signed)
PROGRESS NOTE    Vicki Hernandez  YSA:[id number] DOB: March 27, 1928 DOA: 08/22/2021 PCP: Daisy Floro, MD   Brief Narrative:  88 year old female history of COPD, hypertension, CKD stage IIIa, reflux presents to the ER today after a fall at home.  Patient was try to take the garbage bag out of the garbage can.  She lost her balance.  She struck the right side of her rib cage against the edge of the counter.  She had immediate pain.   Patient brought to ER by EMS.   Rib imaging demonstrates acute fracture of the right seventh and eighth ribs.  No pneumothorax.   EDP discussed the case with trauma surgery Dr. Michaell Cowing.  He recommended patient be transferred under the hospitalist service to [facility]  After admission pt with significant pain, daughter spoke with SW and indicated they wanted to pursue SNF/STR   Assessment & Plan:   Principal Problem:   Multiple fractures of ribs, right side, initial encounter for closed fracture Active Problems:   Multiple rib fractures - right 7-8   Essential hypertension, benign   Chronic obstructive pulmonary disease, unspecified (HCC)   Fall     Multiple rib fractures - right 7-8 Observation med/surg bed.  Trauma service saw in consultation. Continue with pain control. Family requested SNF/STR   Fall Pt fell over trying to remove garbage bag from garbage can. Did not fall to floor but right her right rib cage against counter edge.   Chronic obstructive pulmonary disease, unspecified (HCC) Stable. Not exacerbated.   Essential hypertension, benign Continue home po htn meds.     DVT prophylaxis: SQ Heparin Code Status: Full Code    Code Status Orders  (From admission, onward)           Start     Ordered   08/23/21 0426  Full code  Continuous        08/23/21 0425           Code Status History     Date Active Date Inactive Code Status Order ID Comments User Context   08/22/2021 2357 08/23/2021 0425 Full Code [id number]   Carollee Herter, DO ED   03/25/2011 1509 03/26/2011 1324 Full Code [id number]  Celesta Gentile, RN Inpatient      Advance Directive Documentation    Flowsheet Row Most Recent Value  Type of Advance Directive Healthcare Power of Attorney, Living will, Out of facility DNR (pink MOST or yellow form)  Pre-existing out of facility DNR order (yellow form or pink MOST form) Pink Most/Yellow Form available - Physician notified to receive inpatient order  "MOST" Form in Place? --      Family Communication: DISCUSSED WITH PATIENT IN DETAIL  Disposition Plan:   PAIN NOT CONTROLLED REQUIRING IV PAIN MEDS Consults called: None Admission status: Observation   Consultants:  TRAUMA  Procedures:  DG Abd 2 Views  Result Date: 08/23/2021 CLINICAL DATA:  Multiple rib fractures EXAM: ABDOMEN - 2 VIEW COMPARISON:  Radiograph 08/22/2021 FINDINGS: There is gaseous distension of bowel in the lower abdomen in a nonobstructive pattern. No evidence of free intraperitoneal gas. There are minimally displaced fractures of the right lateral seventh and eighth ribs, and possible the ninth rib. IMPRESSION: Gaseous distention of bowel in the lower abdomen in a nonobstructive pattern. Minimally displaced fracture of the right lateral seventh and eighth ribs and possibly the right ninth rib. Electronically Signed   By: Caprice Renshaw M.D.   On: 08/23/2021 13:27  DG Ribs Unilateral W/Chest Right  Result Date: 08/22/2021 CLINICAL DATA:  Right rib pain EXAM: RIGHT RIBS AND CHEST - 3+ VIEW COMPARISON:  None Available. FINDINGS: There is an acute minimally displaced fracture of the right 7 and 8 ribs laterally. Lungs are clear. No pneumothorax or pleural effusion. Cardiac size within normal limits. Pulmonary vascularity is normal. IMPRESSION: Acute minimally displaced fractures of the right seventh and eighth ribs laterally. No pneumothorax. Electronically Signed   By: Helyn Numbers M.D.   On: 08/22/2021 22:11        Subjective: Pt reports significant pain with movement  Objective: Vitals:   08/23/21 0347 08/23/21 0416 08/23/21 0749 08/23/21 0812  BP:  (!) 155/94 (!) 170/94   Pulse:  85 84   Resp:   16   Temp: (!) 97.4 F (36.3 C) 98.3 F (36.8 C) 98.2 F (36.8 C)   TempSrc: Oral Oral Oral   SpO2:  95% 96% 96%  Weight:      Height:       No intake or output data in the 24 hours ending 08/23/21 1446 Filed Weights   08/22/21 2050  Weight: 53.5 kg    Examination:  General exam: Appears calm and comfortable  Respiratory system: Clear to auscultation. Respiratory effort normal. Cardiovascular system: S1 & S2 heard, RRR. No JVD, murmurs, rubs, gallops or clicks. No pedal edema. Gastrointestinal system: Abdomen is nondistended, soft and nontender. No organomegaly or masses felt. Normal bowel sounds heard. Central nervous system: Alert and oriented. No focal neurological deficits. Extremities: pain with movement 2/2 rib fx Skin: No rashes, lesions or ulcers Psychiatry: Judgement and insight appear normal. Mood & affect appropriate.     Data Reviewed: I have personally reviewed following labs and imaging studies  CBC: Recent Labs  Lab 08/22/21 2250 08/23/21 0540  WBC 8.0 7.2  NEUTROABS 5.7  --   HGB 11.4* 12.4  HCT 36.0 38.7  MCV 94.2 91.5  PLT 215 226   Basic Metabolic Panel: Recent Labs  Lab 08/22/21 2250 08/23/21 0540  NA 143 139  K 4.3 4.0  CL 109 104  CO2 28 29  GLUCOSE 127* 119*  BUN 21 18  CREATININE 1.12* 1.03*  CALCIUM 9.1 9.2   GFR: Estimated Creatinine Clearance: 25.6 mL/min (A) (by C-G formula based on SCr of 1.03 mg/dL (H)). Liver Function Tests: Recent Labs  Lab 08/22/21 2250  AST 17  ALT 10  ALKPHOS 47  BILITOT 0.3  PROT 6.6  ALBUMIN 3.1*   No results for input(s): "LIPASE", "AMYLASE" in the last 168 hours. No results for input(s): "AMMONIA" in the last 168 hours. Coagulation Profile: No results for input(s): "INR", "PROTIME" in the  last 168 hours. Cardiac Enzymes: No results for input(s): "CKTOTAL", "CKMB", "CKMBINDEX", "TROPONINI" in the last 168 hours. BNP (last 3 results) No results for input(s): "PROBNP" in the last 8760 hours. HbA1C: No results for input(s): "HGBA1C" in the last 72 hours. CBG: No results for input(s): "GLUCAP" in the last 168 hours. Lipid Profile: No results for input(s): "CHOL", "HDL", "LDLCALC", "TRIG", "CHOLHDL", "LDLDIRECT" in the last 72 hours. Thyroid Function Tests: No results for input(s): "TSH", "T4TOTAL", "FREET4", "T3FREE", "THYROIDAB" in the last 72 hours. Anemia Panel: No results for input(s): "VITAMINB12", "FOLATE", "FERRITIN", "TIBC", "IRON", "RETICCTPCT" in the last 72 hours. Sepsis Labs: No results for input(s): "PROCALCITON", "LATICACIDVEN" in the last 168 hours.  No results found for this or any previous visit (from the past 240 hour(s)).  Radiology Studies: DG Abd 2 Views  Result Date: 08/23/2021 CLINICAL DATA:  Multiple rib fractures EXAM: ABDOMEN - 2 VIEW COMPARISON:  Radiograph 08/22/2021 FINDINGS: There is gaseous distension of bowel in the lower abdomen in a nonobstructive pattern. No evidence of free intraperitoneal gas. There are minimally displaced fractures of the right lateral seventh and eighth ribs, and possible the ninth rib. IMPRESSION: Gaseous distention of bowel in the lower abdomen in a nonobstructive pattern. Minimally displaced fracture of the right lateral seventh and eighth ribs and possibly the right ninth rib. Electronically Signed   By: Maurine Simmering M.D.   On: 08/23/2021 13:27   DG Ribs Unilateral W/Chest Right  Result Date: 08/22/2021 CLINICAL DATA:  Right rib pain EXAM: RIGHT RIBS AND CHEST - 3+ VIEW COMPARISON:  None Available. FINDINGS: There is an acute minimally displaced fracture of the right 7 and 8 ribs laterally. Lungs are clear. No pneumothorax or pleural effusion. Cardiac size within normal limits. Pulmonary vascularity is normal.  IMPRESSION: Acute minimally displaced fractures of the right seventh and eighth ribs laterally. No pneumothorax. Electronically Signed   By: Fidela Salisbury M.D.   On: 08/22/2021 22:11        Scheduled Meds:  acetaminophen  1,000 mg Oral Q6H   aspirin EC  81 mg Oral Daily   docusate sodium  100 mg Oral BID   fentaNYL (SUBLIMAZE) injection  50 mcg Intravenous Once   fluticasone furoate-vilanterol  1 puff Inhalation Daily   And   umeclidinium bromide  1 puff Inhalation Daily   heparin  5,000 Units Subcutaneous Q8H   irbesartan  150 mg Oral Daily   lidocaine  1 patch Transdermal Q24H   lip balm   Topical BID   polyethylene glycol  17 g Oral BID   simvastatin  40 mg Oral QPM   Continuous Infusions:  albumin human     methocarbamol (ROBAXIN) IV     ondansetron (ZOFRAN) IV       LOS: 0 days    Time spent: 35  min    Nicolette Bang, MD [facility]  If 7PM-7AM, please contact night-coverage  08/23/2021, 2:46 PM

## 2021-08-23 NOTE — Assessment & Plan Note (Signed)
Stable. Not exacerbated. 

## 2021-08-23 NOTE — NC FL2 (Signed)
[place] MEDICAID FL2 LEVEL OF CARE SCREENING TOOL     IDENTIFICATION  Patient Name: Vicki Hernandez Birthdate: 1928-04-05 Sex: female Admission Date (Current Location): 08/22/2021  [facility] and [place] Number:  Producer, television/film/video and Address:  [facility], [address], [place], [place] [address]      Provider Number: [id number]  Attending Physician Name and Address:  Marzetta Board*  Relative Name and Phone Number:  Franchot Mimes Daughter [phone]    Current Level of Care: Hospital Recommended Level of Care: Skilled Nursing Facility Prior Approval Number:    Date Approved/Denied:   PASRR Number: [id number]  Discharge Plan: SNF    Current Diagnoses: Patient Active Problem List   Diagnosis Date Noted   Multiple fractures of ribs, right side, initial encounter for closed fracture 08/23/2021   Carotid artery disease (HCC) 08/22/2021   Gout 08/22/2021   Chronic kidney disease, stage 3 unspecified (HCC) 08/22/2021   Chronic obstructive pulmonary disease, unspecified (HCC) 08/22/2021   Hardening of the aorta (main artery of the heart) (HCC) 08/22/2021   Gastroesophageal reflux disease 08/22/2021   Iron deficiency 08/22/2021   Neuralgia 08/22/2021   Osteoarthritis 08/22/2021   Osteoporosis 08/22/2021   Pure hypercholesterolemia 08/22/2021   Vitamin B12 deficiency 08/22/2021   Hypertension 08/22/2021   Fall 08/22/2021   Multiple rib fractures - right 7-8 08/22/2021   Primary osteoarthritis of both hands 06/07/2016   Primary osteoarthritis of both knees 06/07/2016   DDD (degenerative disc disease), cervical 06/07/2016   DDD (degenerative disc disease), lumbar 06/07/2016   History of macular degeneration 06/07/2016   History of total left knee replacement (TKR) 06/07/2016   S/P cervical spinal fusion 06/07/2016   History of hypertension 03/30/2016   History of gastroesophageal reflux (GERD) 03/30/2016   Age-related  osteoporosis without current pathological fracture 03/30/2016   Intermediate stage nonexudative age-related macular degeneration of left eye 07/30/2015   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   SVT (supraventricular tachycardia) (HCC) 11/29/2012   Essential hypertension, benign 11/29/2012   Tobacco use disorder 11/29/2012   PVD (peripheral vascular disease) (HCC) 11/29/2012   Amblyopia 10/20/2011   Nonexudative age-related macular degeneration 10/20/2011   Status post intraocular lens implant 10/20/2011   Aftercare following surgery of the circulatory system, NEC 10/18/2011   Other symptoms involving cardiovascular system 04/05/2011   Occlusion and stenosis of carotid artery 03/21/2011    Orientation RESPIRATION BLADDER Height & Weight     Self, Time, Situation, Place  Normal Continent Weight: 118 lb (53.5 kg) Height:  5' (152.4 cm)  BEHAVIORAL SYMPTOMS/MOOD NEUROLOGICAL BOWEL NUTRITION STATUS      Continent Diet (See DC Summary)  AMBULATORY STATUS COMMUNICATION OF NEEDS Skin   Limited Assist Verbally Normal                       Personal Care Assistance Level of Assistance  Bathing, Feeding, Dressing Bathing Assistance: Limited assistance Feeding assistance: Independent Dressing Assistance: Limited assistance     Functional Limitations Info  Sight, Hearing, Speech Sight Info: Impaired Hearing Info: Impaired Speech Info: Adequate    SPECIAL CARE FACTORS FREQUENCY  PT (By licensed PT), OT (By licensed OT)     PT Frequency: 5x a week OT Frequency: 5x a week            Contractures Contractures Info: Not present    Additional Factors Info  Code Status, Allergies Code Status Info: Full Allergies Info: Sulfa Antibiotics   Codeine  Cymbalta (Duloxetine Hcl)   Fosamax (Alendronate)           Current Medications (08/23/2021):  This is the current hospital active medication list Current Facility-Administered Medications  Medication Dose Route Frequency  Provider Last Rate Last Admin   acetaminophen (TYLENOL) tablet 650 mg  650 mg Oral Q6H PRN Carollee Herter, DO       Or   acetaminophen (TYLENOL) suppository 650 mg  650 mg Rectal Q6H PRN Carollee Herter, DO       acetaminophen (TYLENOL) tablet 1,000 mg  1,000 mg Oral Q6H Carollee Herter, DO   1,000 mg at 08/23/21 0533   albumin human 5 % solution 12.5 g  12.5 g Intravenous Q6H PRN Carollee Herter, DO       alum & mag hydroxide-simeth (MAALOX/MYLANTA) 200-200-20 MG/5ML suspension 30 mL  30 mL Oral Q6H PRN Carollee Herter, DO       artificial tears (LACRILUBE) ophthalmic ointment   Both Eyes Daily PRN Carollee Herter, DO       aspirin EC tablet 81 mg  81 mg Oral Daily Carollee Herter, DO   81 mg at 08/23/21 1033   bisacodyl (DULCOLAX) suppository 10 mg  10 mg Rectal Q12H PRN Carollee Herter, DO       diphenhydrAMINE (BENADRYL) injection 12.5-25 mg  12.5-25 mg Intravenous Q6H PRN Carollee Herter, DO       docusate sodium (COLACE) capsule 100 mg  100 mg Oral BID Carollee Herter, DO   100 mg at 08/23/21 1033   fentaNYL (SUBLIMAZE) injection 25-50 mcg  25-50 mcg Intravenous Q1H PRN Carollee Herter, DO   50 mcg at 08/23/21 0351   fentaNYL (SUBLIMAZE) injection 50 mcg  50 mcg Intravenous Once Carollee Herter, DO       fluticasone furoate-vilanterol (BREO ELLIPTA) 100-25 MCG/ACT 1 puff  1 puff Inhalation Daily Carollee Herter, DO   1 puff at 08/23/21 1829   And   umeclidinium bromide (INCRUSE ELLIPTA) 62.5 MCG/ACT 1 puff  1 puff Inhalation Daily Carollee Herter, DO   1 puff at 08/23/21 0811   guaiFENesin-dextromethorphan (ROBITUSSIN DM) 100-10 MG/5ML syrup 15 mL  15 mL Oral Q4H PRN Carollee Herter, DO       heparin injection 5,000 Units  5,000 Units Subcutaneous Q8H Carollee Herter, DO   5,000 Units at 08/23/21 0531   irbesartan (AVAPRO) tablet 150 mg  150 mg Oral Daily Carollee Herter, DO   150 mg at 08/23/21 1033   lidocaine (LIDODERM) 5 % 1 patch  1 patch Transdermal Q24H Carollee Herter, DO   1 patch at 08/23/21 0128   lip balm (CARMEX) ointment   Topical BID Carollee Herter, DO   Given at  08/23/21 1038   magic mouthwash  15 mL Oral QID PRN Carollee Herter, DO       menthol-cetylpyridinium (CEPACOL) lozenge 3 mg  1 lozenge Oral PRN Carollee Herter, DO       methocarbamol (ROBAXIN) 500 mg in dextrose 5 % 50 mL IVPB  500 mg Intravenous Q6H PRN Carollee Herter, DO       methocarbamol (ROBAXIN) tablet 500 mg  500 mg Oral Q6H PRN Carollee Herter, DO   500 mg at 08/23/21 1037   metoprolol tartrate (LOPRESSOR) injection 5 mg  5 mg Intravenous Q6H PRN Carollee Herter, DO       naphazoline-glycerin (CLEAR EYES REDNESS) ophth solution 1-2 drop  1-2 drop Both Eyes QID PRN Carollee Herter, DO       ondansetron [facility]) injection 4  mg  4 mg Intravenous Q6H PRN Carollee Herter, DO       Or   ondansetron [facility]) 8 mg in sodium chloride 0.9 % 50 mL IVPB  8 mg Intravenous Q6H PRN Carollee Herter, DO       phenol (CHLORASEPTIC) mouth spray 2 spray  2 spray Mouth/Throat PRN Carollee Herter, DO       polyethylene glycol (MIRALAX / GLYCOLAX) packet 17 g  17 g Oral BID Carollee Herter, DO   17 g at 08/23/21 1032   polyethylene glycol (MIRALAX / GLYCOLAX) packet 17 g  17 g Oral Daily PRN Carollee Herter, DO       prochlorperazine (COMPAZINE) injection 5-10 mg  5-10 mg Intravenous Q4H PRN Carollee Herter, DO       simvastatin (ZOCOR) tablet 40 mg  40 mg Oral QPM Carollee Herter, DO       sodium chloride (OCEAN) 0.65 % nasal spray 1-2 spray  1-2 spray Each Nare Q6H PRN Carollee Herter, DO       traMADol Janean Sark) tablet 50-100 mg  50-100 mg Oral Q6H PRN Carollee Herter, DO   50 mg at 08/23/21 1037     Discharge Medications: Please see discharge summary for a list of discharge medications.  Relevant Imaging Results:  Relevant Lab Results:   Additional Information SSN: [id number]  Ivette Loyal, [place]

## 2021-08-23 NOTE — Consult Note (Signed)
Reason for consult: Fall, rib fractures  Requesting physician: Dr. Carollee Herter, MD  HPI: Vicki Hernandez is an 88 y.o. female with hx of HTN, HLD, GERD, presented to [facility] emergency room with right lateral chest wall pain after a fall.  She was attempting to empty the garbage in her kitchen and lost her balance and fell back striking her lateral back on the counter.  She reports she did not hit the floor.  She was able to push herself off the counter and remained standing.  She denies hitting her head.  She takes care of her husband at home who resides in a wheelchair.  She underwent work-up at [facility].  Case is reviewed Dr. Michaell Cowing.  She was transferred to [facility] for further ongoing trauma care.  Currently feeling better than when she was at the the emergency room.  Her only complaint is some mild right lateral chest wall pain with deep inspiration.  She is afraid to get out of bed for fear that this will make her pain worse.  No history of falls before.  Past Medical History:  Diagnosis Date   Accessory kidney    Angina    3 WEEKS   Arthritis    OSTEO    Atrial fibrillation (HCC) 2014   Carotid artery bruit    Complication of anesthesia    TAKES AWHILE TO WAKE UP    Dysrhythmia    TACHYCARDIA    GERD (gastroesophageal reflux disease)    H/O hiatal hernia    Heart murmur    Hyperlipidemia    Hypertension    Macular degeneration     Past Surgical History:  Procedure Laterality Date   ABDOMINAL HYSTERECTOMY  1974   TAH w/ BSO   APPENDECTOMY     CARDIOVASCULAR STRESS TEST     5 YRS AGO     DR H SMITH  (NOW SEES DR Eldridge Dace)   CAROTID ENDARTERECTOMY Left 03-25-11   cea   CATARACT EXTRACTION W/ INTRAOCULAR LENS  IMPLANT, BILATERAL     ENDARTERECTOMY  03/25/2011   Procedure: ENDARTERECTOMY CAROTID;  Surgeon: Pryor Ochoa, MD;  Location: [facility] OR;  Service: Vascular;  Laterality: Left;  Left Carotid Endarterectomy with dacron patch angioplasty, with resection of redundant  common carotid with primary reanastamosis   EYE SURGERY     HEMORRHOID SURGERY     JOINT REPLACEMENT  2012   Left Knee   KNEE SURGERY     SPINE SURGERY  2002, 2010   Lumbar disk/ spine surgeries X 2   By Dr. Shon Baton   TONSILLECTOMY      Family History  Problem Relation Age of Onset   Hyperlipidemia Father    Hypertension Father    Heart disease Father        Heart Disease before age 5-  Bleeding problems   Heart attack Father    Stroke Mother    Heart disease Mother        After age 63    Social:  reports that she quit smoking about 5 years ago. Her smoking use included cigarettes. She smoked an average of .5 packs per day. She has never used smokeless tobacco. She reports that she does not drink alcohol and does not use drugs.  Allergies:  Allergies  Allergen Reactions   Sulfa Antibiotics Itching and Hives   Codeine Nausea Only   Cymbalta [Duloxetine Hcl]     Unknown reaction   Fosamax [Alendronate]  Other reaction(s): upset hiatal hernia    Medications: I have reviewed the patient's current medications.  Results for orders placed or performed during the hospital encounter of 08/22/21 (from the past 48 hour(s))  CBC with Differential     Status: Abnormal   Collection Time: 08/22/21 10:50 PM  Result Value Ref Range   WBC 8.0 4.0 - 10.5 K/uL   RBC 3.82 (L) 3.87 - 5.11 MIL/uL   Hemoglobin 11.4 (L) 12.0 - 15.0 g/dL   HCT 16.136.0 09.636.0 - 04.546.0 %   MCV 94.2 80.0 - 100.0 fL   MCH 29.8 26.0 - 34.0 pg   MCHC 31.7 30.0 - 36.0 g/dL   RDW 40.915.4 81.111.5 - 91.415.5 %   Platelets 215 150 - 400 K/uL   nRBC 0.0 0.0 - 0.2 %   Neutrophils Relative % 71 %   Neutro Abs 5.7 1.7 - 7.7 K/uL   Lymphocytes Relative 20 %   Lymphs Abs 1.6 0.7 - 4.0 K/uL   Monocytes Relative 6 %   Monocytes Absolute 0.5 0.1 - 1.0 K/uL   Eosinophils Relative 3 %   Eosinophils Absolute 0.2 0.0 - 0.5 K/uL   Basophils Relative 0 %   Basophils Absolute 0.0 0.0 - 0.1 K/uL   Immature Granulocytes 0 %   Abs Immature  Granulocytes 0.01 0.00 - 0.07 K/uL    Comment: Performed at [facility], [address] [place], [place] [address]  Comprehensive metabolic panel     Status: Abnormal   Collection Time: 08/22/21 10:50 PM  Result Value Ref Range   Sodium 143 135 - 145 mmol/L   Potassium 4.3 3.5 - 5.1 mmol/L   Chloride 109 98 - 111 mmol/L   CO2 28 22 - 32 mmol/L   Glucose, Bld 127 (H) 70 - 99 mg/dL    Comment: Glucose reference range applies only to samples taken after fasting for at least 8 hours.   BUN 21 8 - 23 mg/dL   Creatinine, Ser 6.211.12 (H) 0.44 - 1.00 mg/dL   Calcium 9.1 8.9 - 30.810.3 mg/dL   Total Protein 6.6 6.5 - 8.1 g/dL   Albumin 3.1 (L) 3.5 - 5.0 g/dL   AST 17 15 - 41 U/L   ALT 10 0 - 44 U/L   Alkaline Phosphatase 47 38 - 126 U/L   Total Bilirubin 0.3 0.3 - 1.2 mg/dL   GFR, Estimated 46 (L) >60 mL/min    Comment: (NOTE) Calculated using the CKD-EPI Creatinine Equation (2021)    Anion gap 6 5 - 15    Comment: Performed at [facility], [address] [place], [place] [address]  CBC     Status: None   Collection Time: 08/23/21  5:40 AM  Result Value Ref Range   WBC 7.2 4.0 - 10.5 K/uL   RBC 4.23 3.87 - 5.11 MIL/uL   Hemoglobin 12.4 12.0 - 15.0 g/dL   HCT 69.638.7 29.536.0 - 28.446.0 %   MCV 91.5 80.0 - 100.0 fL   MCH 29.3 26.0 - 34.0 pg   MCHC 32.0 30.0 - 36.0 g/dL   RDW 13.215.1 44.011.5 - 10.215.5 %   Platelets 226 150 - 400 K/uL   nRBC 0.0 0.0 - 0.2 %    Comment: Performed at [facility], [address] [place], [place] [address]  Basic metabolic panel     Status: Abnormal   Collection Time: 08/23/21  5:40 AM  Result Value Ref Range   Sodium 139  135 - 145 mmol/L   Potassium 4.0 3.5 - 5.1 mmol/L   Chloride 104 98 - 111 mmol/L   CO2 29 22 - 32 mmol/L   Glucose, Bld 119 (H) 70 - 99 mg/dL    Comment: Glucose reference range applies only to samples taken after fasting for at least 8 hours.   BUN 18 8 - 23 mg/dL   Creatinine, Ser 7.67 (H) 0.44 - 1.00 mg/dL    Calcium 9.2 8.9 - 20.9 mg/dL   GFR, Estimated 51 (L) >60 mL/min    Comment: (NOTE) Calculated using the CKD-EPI Creatinine Equation (2021)    Anion gap 6 5 - 15    Comment: Performed at [facility], [address], [place], [place] [address]    DG Ribs Unilateral W/Chest Right  Result Date: 08/22/2021 CLINICAL DATA:  Right rib pain EXAM: RIGHT RIBS AND CHEST - 3+ VIEW COMPARISON:  None Available. FINDINGS: There is an acute minimally displaced fracture of the right 7 and 8 ribs laterally. Lungs are clear. No pneumothorax or pleural effusion. Cardiac size within normal limits. Pulmonary vascularity is normal. IMPRESSION: Acute minimally displaced fractures of the right seventh and eighth ribs laterally. No pneumothorax. Electronically Signed   By: Helyn Numbers M.D.   On: 08/22/2021 22:11    ROS -all of the below systems have been reviewed with the patient and positives are indicated with bold text General: chills, fever or night sweats Eyes: blurry vision or double vision ENT: epistaxis or sore throat Allergy/Immunology: itchy/watery eyes or nasal congestion Hematologic/Lymphatic: bleeding problems, blood clots or swollen lymph nodes Endocrine: temperature intolerance or unexpected weight changes Breast: new or changing breast lumps or nipple discharge Resp: cough, shortness of breath, or wheezing CV: chest wall pain or dyspnea on exertion GI: nausea, vomiting, abdominal pain GU: dysuria, trouble voiding, or hematuria MSK: joint pain or joint stiffness Neuro: TIA or stroke symptoms Derm: pruritus and skin lesion changes Psych: anxiety and depression  PE Blood pressure (!) 155/94, pulse 85, temperature 98.3 F (36.8 C), temperature source Oral, resp. rate (!) 22, height 5' (1.524 m), weight 53.5 kg, SpO2 95 %. Physical Exam Constitutional: NAD; conversant; no deformities Eyes: Moist conjunctiva; no lid lag; anicteric; PERRL Neck: Trachea midline; no thyromegaly Lungs:  Normal respiratory effort; CTAB; no tactile fremitus CV: RRR; no palpable thrills; no pitting edema GI: Abd soft, NT/ND; no palpable hepatosplenomegaly MSK: Normal range of motion of extremities; no clubbing/cyanosis; no deformities Psychiatric: Appropriate affect; alert and oriented x3 Lymphatic: No palpable cervical or axillary lymphadenopathy  Results for orders placed or performed during the hospital encounter of 08/22/21 (from the past 48 hour(s))  CBC with Differential     Status: Abnormal   Collection Time: 08/22/21 10:50 PM  Result Value Ref Range   WBC 8.0 4.0 - 10.5 K/uL   RBC 3.82 (L) 3.87 - 5.11 MIL/uL   Hemoglobin 11.4 (L) 12.0 - 15.0 g/dL   HCT 28.3 66.2 - 94.7 %   MCV 94.2 80.0 - 100.0 fL   MCH 29.8 26.0 - 34.0 pg   MCHC 31.7 30.0 - 36.0 g/dL   RDW 65.4 65.0 - 35.4 %   Platelets 215 150 - 400 K/uL   nRBC 0.0 0.0 - 0.2 %   Neutrophils Relative % 71 %   Neutro Abs 5.7 1.7 - 7.7 K/uL   Lymphocytes Relative 20 %   Lymphs Abs 1.6 0.7 - 4.0 K/uL   Monocytes Relative 6 %   Monocytes  Absolute 0.5 0.1 - 1.0 K/uL   Eosinophils Relative 3 %   Eosinophils Absolute 0.2 0.0 - 0.5 K/uL   Basophils Relative 0 %   Basophils Absolute 0.0 0.0 - 0.1 K/uL   Immature Granulocytes 0 %   Abs Immature Granulocytes 0.01 0.00 - 0.07 K/uL    Comment: Performed at [facility], [address] [place], [place] [address]  Comprehensive metabolic panel     Status: Abnormal   Collection Time: 08/22/21 10:50 PM  Result Value Ref Range   Sodium 143 135 - 145 mmol/L   Potassium 4.3 3.5 - 5.1 mmol/L   Chloride 109 98 - 111 mmol/L   CO2 28 22 - 32 mmol/L   Glucose, Bld 127 (H) 70 - 99 mg/dL    Comment: Glucose reference range applies only to samples taken after fasting for at least 8 hours.   BUN 21 8 - 23 mg/dL   Creatinine, Ser 2.99 (H) 0.44 - 1.00 mg/dL   Calcium 9.1 8.9 - 24.2 mg/dL   Total Protein 6.6 6.5 - 8.1 g/dL   Albumin 3.1 (L) 3.5 - 5.0 g/dL   AST 17 15 - 41  U/L   ALT 10 0 - 44 U/L   Alkaline Phosphatase 47 38 - 126 U/L   Total Bilirubin 0.3 0.3 - 1.2 mg/dL   GFR, Estimated 46 (L) >60 mL/min    Comment: (NOTE) Calculated using the CKD-EPI Creatinine Equation (2021)    Anion gap 6 5 - 15    Comment: Performed at [facility], [address], [place], [place] [address]  CBC     Status: None   Collection Time: 08/23/21  5:40 AM  Result Value Ref Range   WBC 7.2 4.0 - 10.5 K/uL   RBC 4.23 3.87 - 5.11 MIL/uL   Hemoglobin 12.4 12.0 - 15.0 g/dL   HCT 96.2 22.9 - 79.8 %   MCV 91.5 80.0 - 100.0 fL   MCH 29.3 26.0 - 34.0 pg   MCHC 32.0 30.0 - 36.0 g/dL   RDW 92.1 19.4 - 17.4 %   Platelets 226 150 - 400 K/uL   nRBC 0.0 0.0 - 0.2 %    Comment: Performed at [facility], [address] [place], [place] [address]  Basic metabolic panel     Status: Abnormal   Collection Time: 08/23/21  5:40 AM  Result Value Ref Range   Sodium 139 135 - 145 mmol/L   Potassium 4.0 3.5 - 5.1 mmol/L   Chloride 104 98 - 111 mmol/L   CO2 29 22 - 32 mmol/L   Glucose, Bld 119 (H) 70 - 99 mg/dL    Comment: Glucose reference range applies only to samples taken after fasting for at least 8 hours.   BUN 18 8 - 23 mg/dL   Creatinine, Ser 8.18 (H) 0.44 - 1.00 mg/dL   Calcium 9.2 8.9 - 56.3 mg/dL   GFR, Estimated 51 (L) >60 mL/min    Comment: (NOTE) Calculated using the CKD-EPI Creatinine Equation (2021)    Anion gap 6 5 - 15    Comment: Performed at [facility], [address], [place], [place] [address]    DG Ribs Unilateral W/Chest Right  Result Date: 08/22/2021 CLINICAL DATA:  Right rib pain EXAM: RIGHT RIBS AND CHEST - 3+ VIEW COMPARISON:  None Available. FINDINGS: There is an acute minimally displaced fracture of the right 7 and 8 ribs laterally. Lungs are clear.  No pneumothorax or pleural effusion. Cardiac size within normal limits. Pulmonary vascularity is normal. IMPRESSION: Acute minimally displaced fractures of the right seventh  and eighth ribs laterally. No pneumothorax. Electronically Signed   By: Helyn Numbers M.D.   On: 08/22/2021 22:11      Assessment/Plan: 89yoF s/p fall against counter from standing  At least 2 right-sided rib fractures apparent on plain film.  She has no central back pain.  She ambulated afterwards.  Multimodal pain control.  Incentive spirometry. Fall - PT/OT  I spent a total of 60 minutes in both face-to-face and non-face-to-face activities, excluding procedures performed, for this visit on the date of this encounter.  Marin Olp, MD [facility]

## 2021-08-23 NOTE — Evaluation (Addendum)
Physical Therapy Evaluation Patient Details Name: Vicki Hernandez MRN: [id number] DOB: 09/25/1928 Today's Date: 08/23/2021  History of Present Illness  Pt is 88 year old female history of COPD, hypertension, CKD stage IIIa, reflux presents to the ER today after a fall at home. Patient was try to take the garbage bag out of the garbage can. She lost her balance. She struck the right side of her rib cage against the edge of the counter.  She had immediate pain. Patient brought to ER by EMS.  Rib imaging demonstrates acute fracture of the right seventh and eighth ribs.  No pneumothorax.  Clinical Impression  Pt agreeable to physical therapy evaluation/treatment session. Pt requiring minimal assistance at this time for bed mobility, transfers, and gait. Anticipate pt will progress well once her pain is more controlled. Pt currently presents with functional limitations secondary to impairments listed in PT problem list. Pt to benefit from skilled, acute care physical therapy interventions to maximize her independence level and quality of life. Will initiate gait training with rollator which pt has at home.     Recommendations for follow up therapy are one component of a multi-disciplinary discharge planning process, led by the attending physician.  Recommendations may be updated based on patient status, additional functional criteria and insurance authorization.  Follow Up Recommendations Skilled nursing-short term rehab (<3 hours/day)      Assistance Recommended at Discharge Frequent or constant Supervision/Assistance  Patient can return home with the following  A little help with walking and/or transfers;A little help with bathing/dressing/bathroom;Assistance with cooking/housework;Assist for transportation    Equipment Recommendations None recommended by PT  Recommendations for Other Services       Functional Status Assessment Patient has had a recent decline in their functional status and  demonstrates the ability to make significant improvements in function in a reasonable and predictable amount of time.     Precautions / Restrictions Precautions Precautions: Fall Restrictions Weight Bearing Restrictions: No      Mobility  Bed Mobility Overal bed mobility: Needs Assistance Bed Mobility: Rolling, Supine to Sit Rolling: Min assist   Supine to sit: Min assist     General bed mobility comments: Pt required several attempts 2/2 pain to complete rolling and supine to sit to left side. Bed rail utilized.    Transfers Overall transfer level: Needs assistance Equipment used: Rolling walker (2 wheels) Transfers: Sit to/from Stand, Bed to chair/wheelchair/BSC Sit to Stand: Min assist   Step pivot transfers: Min assist       General transfer comment: Pt required cues for sequencing.    Ambulation/Gait Ambulation/Gait assistance: Min assist Gait Distance (Feet): 4 Feet Assistive device: Rolling walker (2 wheels) Gait Pattern/deviations: Antalgic, Trunk flexed, Decreased step length - right, Decreased step length - left Gait velocity: decreased Gait velocity interpretation: <1.31 ft/sec, indicative of household ambulator   General Gait Details: Limited distance performed from bed to chair due to pt's severe pain during step pivot transfer.  Stairs            Wheelchair Mobility    Modified Rankin (Stroke Patients Only)       Balance Overall balance assessment: Needs assistance Sitting-balance support: Bilateral upper extremity supported Sitting balance-Leahy Scale: Fair     Standing balance support: Reliant on assistive device for balance Standing balance-Leahy Scale: Poor                               Pertinent Vitals/Pain  Pain Assessment Pain Assessment: 0-10 Pain Score: 5  (10/10 with movement) Pain Location: R side of back at ribs Pain Descriptors / Indicators: Sharp, Moaning, Grimacing, Guarding Pain Intervention(s):  Limited activity within patient's tolerance, Monitored during session    Home Living Family/patient expects to be discharged to:: Other (Comment) (Lives in retirement home) Living Arrangements: Spouse/significant other Available Help at Discharge: Available PRN/intermittently;Friend(s) Type of Home: Other(Comment) (Retirement home) Home Access: Elevator       Home Layout: One level Home Equipment: Electric scooter;Grab bars - tub/shower;Grab bars - toilet;Rollator (4 wheels);Cane - single point      Prior Function Prior Level of Function : Independent/Modified Independent (Does not drive; denies falls this year outside of this episode)             Mobility Comments: Does not AD. Uses her electric scooter for long distances. ADLs Comments: Pt helps her husband get dressed and get in and out of shower. Pt is independent with bathing, cooking, and dressing. Pt has maid come to complete housework.     Hand Dominance   Dominant Hand: Right    Extremity/Trunk Assessment   Upper Extremity Assessment Upper Extremity Assessment: Defer to OT evaluation    Lower Extremity Assessment Lower Extremity Assessment: Generalized weakness       Communication   Communication: HOH  Cognition Arousal/Alertness: Awake/alert Behavior During Therapy: WFL for tasks assessed/performed Overall Cognitive Status: Within Functional Limits for tasks assessed                                 General Comments: Pt A and O x 4        General Comments General comments (skin integrity, edema, etc.): HR and SpO2 stable on RA    Exercises     Assessment/Plan    PT Assessment Patient needs continued PT services  PT Problem List Decreased strength;Decreased activity tolerance;Decreased balance;Decreased mobility;Decreased knowledge of use of DME;Pain       PT Treatment Interventions DME instruction;Gait training;Functional mobility training;Therapeutic activities;Therapeutic  exercise;Balance training;Neuromuscular re-education;Patient/family education;Other (comment) (ice as needed for pain)    PT Goals (Current goals can be found in the Care Plan section)  Acute Rehab PT Goals Patient Stated Goal: none stated PT Goal Formulation: With patient Time For Goal Achievement: 08/30/21 Potential to Achieve Goals: Good    Frequency Min 3X/week     Co-evaluation               AM-PAC PT "6 Clicks" Mobility  Outcome Measure Help needed turning from your back to your side while in a flat bed without using bedrails?: A Little Help needed moving from lying on your back to sitting on the side of a flat bed without using bedrails?: A Little Help needed moving to and from a bed to a chair (including a wheelchair)?: A Little Help needed standing up from a chair using your arms (e.g., wheelchair or bedside chair)?: A Little Help needed to walk in hospital room?: A Lot Help needed climbing 3-5 steps with a railing? : Total 6 Click Score: 15    End of Session Equipment Utilized During Treatment: Gait belt Activity Tolerance: Patient limited by pain Patient left: in chair;with call bell/phone within reach;with chair alarm set Nurse Communication: Mobility status;Patient requests pain meds PT Visit Diagnosis: Other abnormalities of gait and mobility (R26.89);Muscle weakness (generalized) (M62.81);Pain Pain - Right/Left: Right Pain - part of body:  (back/ribs)  Time: [id number] PT Time Calculation (min) (ACUTE ONLY): 32 min   Charges:   PT Evaluation $PT Eval Low Complexity: 1 Low PT Treatments $Therapeutic Activity: 8-22 mins        Donna Bernard, PT   [facility] 08/23/2021, 9:58 AM

## 2021-08-23 NOTE — H&P (Signed)
History and Physical    Vicki Hernandez:[id number] DOB: 1928/02/14 DOA: 08/22/2021  DOS: the patient was seen and examined on 08/22/2021  PCP: Daisy Floro, MD   Patient coming from: Home  I have personally briefly reviewed patient's old medical records in [facility]  CC: pain right rib cage HPI: 88 year old female history of COPD, hypertension, CKD stage IIIa, reflux presents to the ER today after a fall at home.  Patient was try to take the garbage bag out of the garbage can.  She lost her balance.  She struck the right side of her rib cage against the edge of the counter.  She had immediate pain.  Patient brought to ER by EMS.  Rib imaging demonstrates acute fracture of the right seventh and eighth ribs.  No pneumothorax.  EDP discussed the case with trauma surgery Dr. Michaell Cowing.  He recommended patient be transferred under the hospitalist service to [facility].  Trauma service will see her in the morning.  [facility] contacted for admission.   ED Course: X-rays show right seventh and eighth rib fractures.  Review of Systems:  Review of Systems  Constitutional: Negative.   HENT: Negative.    Eyes: Negative.   Respiratory: Negative.    Cardiovascular: Negative.   Gastrointestinal: Negative.   Genitourinary: Negative.   Musculoskeletal:  Positive for falls.       Right-sided rib pain.  Skin: Negative.   Neurological: Negative.   Endo/Heme/Allergies: Negative.   Psychiatric/Behavioral: Negative.    All other systems reviewed and are negative.   Past Medical History:  Diagnosis Date   Accessory kidney    Angina    3 WEEKS   Arthritis    OSTEO    Atrial fibrillation (HCC) 2014   Carotid artery bruit    Complication of anesthesia    TAKES AWHILE TO WAKE UP    Dysrhythmia    TACHYCARDIA    GERD (gastroesophageal reflux disease)    H/O hiatal hernia    Heart murmur    Hyperlipidemia    Hypertension    Macular degeneration     Past Surgical  History:  Procedure Laterality Date   ABDOMINAL HYSTERECTOMY  1974   TAH w/ BSO   APPENDECTOMY     CARDIOVASCULAR STRESS TEST     5 YRS AGO     DR H SMITH  (NOW SEES DR Eldridge Dace)   CAROTID ENDARTERECTOMY Left 03-25-11   cea   CATARACT EXTRACTION W/ INTRAOCULAR LENS  IMPLANT, BILATERAL     ENDARTERECTOMY  03/25/2011   Procedure: ENDARTERECTOMY CAROTID;  Surgeon: Pryor Ochoa, MD;  Location: [facility] OR;  Service: Vascular;  Laterality: Left;  Left Carotid Endarterectomy with dacron patch angioplasty, with resection of redundant common carotid with primary reanastamosis   EYE SURGERY     HEMORRHOID SURGERY     JOINT REPLACEMENT  2012   Left Knee   KNEE SURGERY     SPINE SURGERY  2002, 2010   Lumbar disk/ spine surgeries X 2   By Dr. Shon Baton   TONSILLECTOMY       reports that she quit smoking about 5 years ago. Her smoking use included cigarettes. She smoked an average of .5 packs per day. She has never used smokeless tobacco. She reports that she does not drink alcohol and does not use drugs.  Allergies  Allergen Reactions   Sulfa Antibiotics Itching and Hives   Codeine Nausea Only   Cymbalta [Duloxetine Hcl]  Unknown reaction   Fosamax [Alendronate]     Other reaction(s): upset hiatal hernia    Family History  Problem Relation Age of Onset   Hyperlipidemia Father    Hypertension Father    Heart disease Father        Heart Disease before age 10-  Bleeding problems   Heart attack Father    Stroke Mother    Heart disease Mother        After age 59    Prior to Admission medications   Medication Sig Start Date End Date Taking? Authorizing Provider  aspirin EC 81 MG tablet Take 1 tablet (81 mg total) by mouth daily. 01/25/18  Yes Corky Crafts, MD  Budeson-Glycopyrrol-Formoterol (BREZTRI AEROSPHERE) 160-9-4.8 MCG/ACT AERO Inhale 2 puffs into the lungs daily as needed (for shortness of breath).   Yes [provider]  Cyanocobalamin (VITAMIN B12) 3000 MCG SUBL Take  3,000 mcg by mouth daily. 01/06/21  Yes [provider]  esomeprazole (NEXIUM) 20 MG capsule Take 20 mg by mouth daily at 12 noon.   Yes [provider]  metoprolol tartrate (LOPRESSOR) 25 MG tablet TAKE 1/2 TABLET TWICE DAILY, MAY TAKE AN EXTRA 1 TABLET AS NEEDED FOR PALPITATIONS Patient taking differently: Take 12.5 mg by mouth daily. 04/04/14  Yes Corky Crafts, MD  Multiple Vitamins-Minerals (EYE-VITES PO) Take 1 tablet by mouth daily.    Yes [provider]  nitroGLYCERIN (NITROSTAT) 0.4 MG SL tablet Place 1 tablet (0.4 mg total) under the tongue every 5 (five) minutes as needed for chest pain. 03/18/20 08/23/22 Yes Corky Crafts, MD  polyethylene glycol [facility] / Ethelene Hal) packet Take 17 g by mouth daily.   Yes [provider]  simvastatin (ZOCOR) 40 MG tablet Take 1 tablet (40 mg total) by mouth every evening. Patient taking differently: Take 40 mg by mouth daily. 03/19/18  Yes Corky Crafts, MD  telmisartan (MICARDIS) 40 MG tablet Take 40 mg by mouth daily. 03/21/19  Yes [provider]  VITAMIN D, ERGOCALCIFEROL, PO Take 1 capsule by mouth every morning.    Yes [provider]  Polyethyl Glycol-Propyl Glycol (SYSTANE OP) Place 1-2 drops into both eyes daily as needed. For dry eyes Patient not taking: Reported on 08/23/2021    [provider]  SYMBICORT 160-4.5 MCG/ACT inhaler Inhale 2 puffs into the lungs daily as needed (for shortness of breath). Patient not taking: Reported on 08/23/2021 02/14/17   [provider]    Physical Exam: Vitals:   08/22/21 2040 08/22/21 2050 08/22/21 2211  BP:  (!) 144/66 (!) 191/79  Pulse:  81 86  Resp:  16 16  Temp:  98.4 F (36.9 C)   TempSrc:  Oral   SpO2: 96% 94% 95%  Weight:  53.5 kg   Height:  5' (1.524 m)     Physical Exam Vitals and nursing note reviewed.  Constitutional:      General: She is not in acute distress.    Appearance: Normal appearance. She is  not ill-appearing, toxic-appearing or diaphoretic.  HENT:     Head: Normocephalic and atraumatic.     Nose: Nose normal.  Cardiovascular:     Rate and Rhythm: Normal rate and regular rhythm.     Pulses: Normal pulses.  Pulmonary:     Effort: Pulmonary effort is normal. No respiratory distress.     Breath sounds: No wheezing or rales.  Abdominal:     General: Abdomen is flat. Bowel sounds are  normal. There is no distension.     Palpations: Abdomen is soft.     Tenderness: There is no abdominal tenderness. There is guarding.  Musculoskeletal:     Right lower leg: No edema.     Left lower leg: No edema.  Skin:    General: Skin is warm and dry.     Capillary Refill: Capillary refill takes less than 2 seconds.     Comments: Bruising noted above the left ankle.  Right-sided rib pain on the seventh and eighth lateral aspect with palpation.  No crepitus.  Neurological:     General: No focal deficit present.     Mental Status: She is alert and oriented to person, place, and time.      Labs on Admission: I have personally reviewed following labs and imaging studies  CBC: Recent Labs  Lab 08/22/21 2250  WBC 8.0  NEUTROABS 5.7  HGB 11.4*  HCT 36.0  MCV 94.2  PLT 123456   Basic Metabolic Panel: Recent Labs  Lab 08/22/21 2250  NA 143  K 4.3  CL 109  CO2 28  GLUCOSE 127*  BUN 21  CREATININE 1.12*  CALCIUM 9.1   GFR: Estimated Creatinine Clearance: 23.5 mL/min (A) (by C-G formula based on SCr of 1.12 mg/dL (H)). Liver Function Tests: Recent Labs  Lab 08/22/21 2250  AST 17  ALT 10  ALKPHOS 47  BILITOT 0.3  PROT 6.6  ALBUMIN 3.1*   No results for input(s): "LIPASE", "AMYLASE" in the last 168 hours. No results for input(s): "AMMONIA" in the last 168 hours. Coagulation Profile: No results for input(s): "INR", "PROTIME" in the last 168 hours. Cardiac Enzymes: No results for input(s): "CKTOTAL", "CKMB", "CKMBINDEX", "TROPONINI", "TROPONINIHS" in the last 168  hours. BNP (last 3 results) No results for input(s): "PROBNP" in the last 8760 hours. HbA1C: No results for input(s): "HGBA1C" in the last 72 hours. CBG: No results for input(s): "GLUCAP" in the last 168 hours. Lipid Profile: No results for input(s): "CHOL", "HDL", "LDLCALC", "TRIG", "CHOLHDL", "LDLDIRECT" in the last 72 hours. Thyroid Function Tests: No results for input(s): "TSH", "T4TOTAL", "FREET4", "T3FREE", "THYROIDAB" in the last 72 hours. Anemia Panel: No results for input(s): "VITAMINB12", "FOLATE", "FERRITIN", "TIBC", "IRON", "RETICCTPCT" in the last 72 hours. Urine analysis:    Component Value Date/Time   COLORURINE YELLOW 03/24/2011 [place] 03/24/2011 1555   LABSPEC 1.013 03/24/2011 1555   PHURINE 7.0 03/24/2011 1555   GLUCOSEU NEGATIVE 03/24/2011 1555   HGBUR NEGATIVE 03/24/2011 1555   BILIRUBINUR NEGATIVE 03/24/2011 1555   KETONESUR NEGATIVE 03/24/2011 1555   PROTEINUR NEGATIVE 03/24/2011 1555   UROBILINOGEN 0.2 03/24/2011 1555   NITRITE NEGATIVE 03/24/2011 1555   LEUKOCYTESUR NEGATIVE 03/24/2011 1555    Radiological Exams on Admission: I have personally reviewed images DG Ribs Unilateral W/Chest Right  Result Date: 08/22/2021 CLINICAL DATA:  Right rib pain EXAM: RIGHT RIBS AND CHEST - 3+ VIEW COMPARISON:  None Available. FINDINGS: There is an acute minimally displaced fracture of the right 7 and 8 ribs laterally. Lungs are clear. No pneumothorax or pleural effusion. Cardiac size within normal limits. Pulmonary vascularity is normal. IMPRESSION: Acute minimally displaced fractures of the right seventh and eighth ribs laterally. No pneumothorax. Electronically Signed   By: Fidela Salisbury M.D.   On: 08/22/2021 22:11    EKG: My personal interpretation of EKG shows: no ekg  Assessment/Plan Principal Problem:   Multiple fractures of ribs, right side, initial encounter for closed fracture Active Problems:  Multiple rib fractures - right 7-8    Essential hypertension, benign   Chronic obstructive pulmonary disease, unspecified (HCC)   Fall    Assessment and Plan: * Multiple fractures of ribs, right side, initial encounter for closed fracture As above  Multiple rib fractures - right 7-8 Observation med/surg bed. Ad[facility] to [facility] per Dr. Michaell Cowing' request. Trauma service to see tomorrow. Continue with IV fentanyl for pain control.  Fall Pt fell over trying to remove garbage bag from garbage can. Did not fall to floor but right her right rib cage against counter edge.  Chronic obstructive pulmonary disease, unspecified (HCC) Stable. Not exacerbated.  Essential hypertension, benign Continue home po htn meds.   DVT prophylaxis: SQ Heparin Code Status: Full Code Family Communication: no family at bedside  Disposition Plan: return home  Consults called: EDP discussed with trauma Dr. Michaell Cowing  Admission status: Observation, Med-Surg   Carollee Herter, DO [facility] 08/23/2021, 12:09 AM

## 2021-08-23 NOTE — Assessment & Plan Note (Signed)
Continue home po htn meds.

## 2021-08-23 NOTE — TOC Progression Note (Addendum)
Transition of Care [facility]) - Progression Note    Patient Details  Name: LIZZETH MEDER MRN: [id number] Date of Birth: September 11, 1928  Transition of Care [facility]) CM/SW Contact  Ivette Loyal, [place] Phone Number: 08/23/2021, 1:15 PM  Clinical Narrative:    CSW spoke with pt daughter about SNF placement. Pt daughter stated that she was having knee surgery this evening and provided CSW with her  husband's contact information for any updates (Dr. Annice Needy [phone]). CSW was also informed that her father is still at the [facility] and he has to have assistance so her son will stay there while pt is getting rehab.    CSW contacted admissions at [facility], no answer CSW left a VM.Katie at [facility] contacted CSW, she is able to provide a bed on Wednesday. CSW will follow up with [facility] in the morning for placement status.     Expected Discharge Plan: Skilled Nursing Facility Barriers to Discharge: Continued Medical Work up  Expected Discharge Plan and Services Expected Discharge Plan: Skilled Nursing Facility In-house Referral: Clinical Social Work Discharge Planning Services: CM Consult   Living arrangements for the past 2 months: Independent Living Facility                   DME Agency: NA       HH Arranged: NA           Social Determinants of Health (SDOH) Interventions    Readmission Risk Interventions     No data to display

## 2021-08-23 NOTE — Assessment & Plan Note (Signed)
Pt fell over trying to remove garbage bag from garbage can. Did not fall to floor but right her right rib cage against counter edge.

## 2021-08-24 ENCOUNTER — Encounter (HOSPITAL_COMMUNITY): Payer: Self-pay | Admitting: Internal Medicine

## 2021-08-24 ENCOUNTER — Observation Stay (HOSPITAL_COMMUNITY): Payer: Medicare Other

## 2021-08-24 DIAGNOSIS — M4854XA Collapsed vertebra, not elsewhere classified, thoracic region, initial encounter for fracture: Secondary | ICD-10-CM | POA: Diagnosis not present

## 2021-08-24 DIAGNOSIS — Z743 Need for continuous supervision: Secondary | ICD-10-CM | POA: Diagnosis not present

## 2021-08-24 DIAGNOSIS — Z90710 Acquired absence of both cervix and uterus: Secondary | ICD-10-CM | POA: Diagnosis not present

## 2021-08-24 DIAGNOSIS — I7143 Infrarenal abdominal aortic aneurysm, without rupture: Secondary | ICD-10-CM | POA: Diagnosis present

## 2021-08-24 DIAGNOSIS — H3530 Unspecified macular degeneration: Secondary | ICD-10-CM | POA: Diagnosis present

## 2021-08-24 DIAGNOSIS — E785 Hyperlipidemia, unspecified: Secondary | ICD-10-CM | POA: Diagnosis not present

## 2021-08-24 DIAGNOSIS — R29898 Other symptoms and signs involving the musculoskeletal system: Secondary | ICD-10-CM | POA: Diagnosis not present

## 2021-08-24 DIAGNOSIS — R079 Chest pain, unspecified: Secondary | ICD-10-CM | POA: Diagnosis not present

## 2021-08-24 DIAGNOSIS — Z885 Allergy status to narcotic agent status: Secondary | ICD-10-CM | POA: Diagnosis not present

## 2021-08-24 DIAGNOSIS — K219 Gastro-esophageal reflux disease without esophagitis: Secondary | ICD-10-CM | POA: Diagnosis not present

## 2021-08-24 DIAGNOSIS — K6389 Other specified diseases of intestine: Secondary | ICD-10-CM | POA: Diagnosis not present

## 2021-08-24 DIAGNOSIS — W19XXXA Unspecified fall, initial encounter: Secondary | ICD-10-CM | POA: Diagnosis not present

## 2021-08-24 DIAGNOSIS — Z66 Do not resuscitate: Secondary | ICD-10-CM | POA: Diagnosis not present

## 2021-08-24 DIAGNOSIS — K8689 Other specified diseases of pancreas: Secondary | ICD-10-CM | POA: Diagnosis not present

## 2021-08-24 DIAGNOSIS — J449 Chronic obstructive pulmonary disease, unspecified: Secondary | ICD-10-CM | POA: Diagnosis not present

## 2021-08-24 DIAGNOSIS — I714 Abdominal aortic aneurysm, without rupture, unspecified: Secondary | ICD-10-CM

## 2021-08-24 DIAGNOSIS — N1831 Chronic kidney disease, stage 3a: Secondary | ICD-10-CM | POA: Diagnosis not present

## 2021-08-24 DIAGNOSIS — R072 Precordial pain: Secondary | ICD-10-CM

## 2021-08-24 DIAGNOSIS — Z882 Allergy status to sulfonamides status: Secondary | ICD-10-CM | POA: Diagnosis not present

## 2021-08-24 DIAGNOSIS — Z79899 Other long term (current) drug therapy: Secondary | ICD-10-CM | POA: Diagnosis not present

## 2021-08-24 DIAGNOSIS — M1990 Unspecified osteoarthritis, unspecified site: Secondary | ICD-10-CM | POA: Diagnosis not present

## 2021-08-24 DIAGNOSIS — S2241XA Multiple fractures of ribs, right side, initial encounter for closed fracture: Secondary | ICD-10-CM | POA: Diagnosis not present

## 2021-08-24 DIAGNOSIS — I739 Peripheral vascular disease, unspecified: Secondary | ICD-10-CM | POA: Diagnosis not present

## 2021-08-24 DIAGNOSIS — I129 Hypertensive chronic kidney disease with stage 1 through stage 4 chronic kidney disease, or unspecified chronic kidney disease: Secondary | ICD-10-CM | POA: Diagnosis not present

## 2021-08-24 DIAGNOSIS — J479 Bronchiectasis, uncomplicated: Secondary | ICD-10-CM | POA: Diagnosis not present

## 2021-08-24 DIAGNOSIS — R0789 Other chest pain: Secondary | ICD-10-CM | POA: Diagnosis not present

## 2021-08-24 DIAGNOSIS — Q630 Accessory kidney: Secondary | ICD-10-CM | POA: Diagnosis not present

## 2021-08-24 DIAGNOSIS — Y92009 Unspecified place in unspecified non-institutional (private) residence as the place of occurrence of the external cause: Secondary | ICD-10-CM | POA: Diagnosis not present

## 2021-08-24 DIAGNOSIS — Z7951 Long term (current) use of inhaled steroids: Secondary | ICD-10-CM | POA: Diagnosis not present

## 2021-08-24 DIAGNOSIS — Z7982 Long term (current) use of aspirin: Secondary | ICD-10-CM | POA: Diagnosis not present

## 2021-08-24 DIAGNOSIS — Z888 Allergy status to other drugs, medicaments and biological substances status: Secondary | ICD-10-CM | POA: Diagnosis not present

## 2021-08-24 DIAGNOSIS — J90 Pleural effusion, not elsewhere classified: Secondary | ICD-10-CM | POA: Diagnosis not present

## 2021-08-24 DIAGNOSIS — M8580 Other specified disorders of bone density and structure, unspecified site: Secondary | ICD-10-CM | POA: Diagnosis present

## 2021-08-24 DIAGNOSIS — Z7401 Bed confinement status: Secondary | ICD-10-CM | POA: Diagnosis not present

## 2021-08-24 DIAGNOSIS — W01198A Fall on same level from slipping, tripping and stumbling with subsequent striking against other object, initial encounter: Secondary | ICD-10-CM | POA: Diagnosis present

## 2021-08-24 DIAGNOSIS — R1011 Right upper quadrant pain: Secondary | ICD-10-CM | POA: Diagnosis not present

## 2021-08-24 DIAGNOSIS — Z9079 Acquired absence of other genital organ(s): Secondary | ICD-10-CM | POA: Diagnosis not present

## 2021-08-24 DIAGNOSIS — I700 Atherosclerosis of aorta: Secondary | ICD-10-CM | POA: Diagnosis not present

## 2021-08-24 DIAGNOSIS — S3991XA Unspecified injury of abdomen, initial encounter: Secondary | ICD-10-CM | POA: Diagnosis not present

## 2021-08-24 LAB — TROPONIN I (HIGH SENSITIVITY)
Troponin I (High Sensitivity): 109 ng/L (ref <18)
Troponin I (High Sensitivity): 247 ng/L (ref <18)

## 2021-08-24 LAB — COMPREHENSIVE METABOLIC PANEL
ALT: 12 U/L (ref 0–44)
AST: 21 U/L (ref 15–41)
Albumin: 3.5 g/dL (ref 3.5–5.0)
Alkaline Phosphatase: 57 U/L (ref 38–126)
Anion gap: 14 (ref 5–15)
BUN: 22 mg/dL (ref 8–23)
CO2: 23 mmol/L (ref 22–32)
Calcium: 9.7 mg/dL (ref 8.9–10.3)
Chloride: 97 mmol/L — ABNORMAL LOW (ref 98–111)
Creatinine, Ser: 1.05 mg/dL — ABNORMAL HIGH (ref 0.44–1.00)
GFR, Estimated: 50 mL/min — ABNORMAL LOW (ref >60)
Glucose, Bld: 130 mg/dL — ABNORMAL HIGH (ref 70–99)
Potassium: 3.9 mmol/L (ref 3.5–5.1)
Sodium: 134 mmol/L — ABNORMAL LOW (ref 135–145)
Total Bilirubin: 0.9 mg/dL (ref 0.3–1.2)
Total Protein: 7.4 g/dL (ref 6.5–8.1)

## 2021-08-24 LAB — CBC
HCT: 45.8 % (ref 36.0–46.0)
Hemoglobin: 15 g/dL (ref 12.0–15.0)
MCH: 29 pg (ref 26.0–34.0)
MCHC: 32.8 g/dL (ref 30.0–36.0)
MCV: 88.6 fL (ref 80.0–100.0)
Platelets: 297 10*3/uL (ref 150–400)
RBC: 5.17 MIL/uL — ABNORMAL HIGH (ref 3.87–5.11)
RDW: 14.6 % (ref 11.5–15.5)
WBC: 11.5 10*3/uL — ABNORMAL HIGH (ref 4.0–10.5)
nRBC: 0 % (ref 0.0–0.2)

## 2021-08-24 LAB — HEPARIN LEVEL (UNFRACTIONATED): Heparin Unfractionated: 0.89 IU/mL — ABNORMAL HIGH (ref 0.30–0.70)

## 2021-08-24 MED ORDER — HEPARIN (PORCINE) 25000 UT/250ML-% IV SOLN
550.0000 [IU]/h | INTRAVENOUS | Status: DC
  Administered 2021-08-24: 650 [IU]/h via INTRAVENOUS
  Filled 2021-08-24: qty 250

## 2021-08-24 MED ORDER — METHOCARBAMOL 500 MG PO TABS
500.0000 mg | ORAL_TABLET | Freq: Four times a day (QID) | ORAL | Status: DC
  Administered 2021-08-24 – 2021-08-27 (×14): 500 mg via ORAL
  Filled 2021-08-24 (×14): qty 1

## 2021-08-24 MED ORDER — IOHEXOL 300 MG/ML  SOLN
100.0000 mL | Freq: Once | INTRAMUSCULAR | Status: AC | PRN
  Administered 2021-08-24: 80 mL via INTRAVENOUS

## 2021-08-24 MED ORDER — OXYCODONE HCL 5 MG PO TABS
2.5000 mg | ORAL_TABLET | ORAL | Status: DC | PRN
  Administered 2021-08-25 – 2021-08-27 (×7): 5 mg via ORAL
  Filled 2021-08-24 (×7): qty 1

## 2021-08-24 MED ORDER — NITROGLYCERIN 0.4 MG SL SUBL
0.4000 mg | SUBLINGUAL_TABLET | SUBLINGUAL | Status: DC | PRN
Start: 2021-08-24 — End: 2021-08-27
  Administered 2021-08-24 – 2021-08-25 (×2): 0.4 mg via SUBLINGUAL
  Filled 2021-08-24: qty 1

## 2021-08-24 MED ORDER — NITROGLYCERIN 0.4 MG SL SUBL
SUBLINGUAL_TABLET | SUBLINGUAL | Status: AC
  Filled 2021-08-24: qty 1

## 2021-08-24 MED ORDER — MORPHINE SULFATE (PF) 2 MG/ML IV SOLN
1.0000 mg | Freq: Once | INTRAVENOUS | Status: AC
  Administered 2021-08-24: 1 mg via INTRAVENOUS
  Filled 2021-08-24: qty 1

## 2021-08-24 MED ORDER — HEPARIN BOLUS VIA INFUSION
3000.0000 [IU] | Freq: Once | INTRAVENOUS | Status: AC
Start: 2021-08-24 — End: 2021-08-24
  Administered 2021-08-24: 3000 [IU] via INTRAVENOUS
  Filled 2021-08-24: qty 3000

## 2021-08-24 NOTE — Progress Notes (Signed)
ANTICOAGULATION CONSULT NOTE  Pharmacy Consult for Heparin Indication: chest pain/ACS  Allergies  Allergen Reactions   Sulfa Antibiotics Itching and Hives   Codeine Nausea Only   Cymbalta [Duloxetine Hcl]     Unknown reaction   Fosamax [Alendronate]     Other reaction(s): upset hiatal hernia    Patient Measurements: Height: 5' (152.4 cm) Weight: 53.5 kg (118 lb) IBW/kg (Calculated) : 45.5  Heparin Dosing Weight: 54 kg  Vital Signs: Temp: 98.7 F (37.1 C) (08/08 0650) Temp Source: Oral (08/08 0650) BP: 180/108 (08/08 0650) Pulse Rate: 110 (08/08 0650)  Labs: Recent Labs    08/22/21 2250 08/23/21 0540 08/24/21 0823  HGB 11.4* 12.4 15.0  HCT 36.0 38.7 45.8  PLT 215 226 297  CREATININE 1.12* 1.03* 1.05*  TROPONINIHS  --   --  109*    Estimated Creatinine Clearance: 25.1 mL/min (A) (by C-G formula based on SCr of 1.05 mg/dL (H)).   Medical History: Past Medical History:  Diagnosis Date   Accessory kidney    Angina    3 WEEKS   Arthritis    OSTEO    Atrial fibrillation (HCC) 2014   Carotid artery bruit    Complication of anesthesia    TAKES AWHILE TO WAKE UP    Dysrhythmia    TACHYCARDIA    GERD (gastroesophageal reflux disease)    H/O hiatal hernia    Heart murmur    Hyperlipidemia    Hypertension    Macular degeneration      Assessment: 88 year old female history of COPD, hypertension, CKD stage IIIa, reflux presented to after a fall at home. Now with concerns for ACS/CP. Pharmacy consulted to initiate heparin infusion. CBC stable. No AC PTA.  Goal of Therapy:  Heparin level 0.3-0.7 units/ml Monitor platelets by anticoagulation protocol: Yes   Plan:   Heparin 3000 units IV x1 bolus Start heparin infusion at 650 units/hr Check heparin level in 8 hours and daily while on heparin Continue to monitor H&H and platelets    Thank you for allowing pharmacy to be a part of this patient's care.  Thelma Barge, PharmD Clinical  Pharmacist

## 2021-08-24 NOTE — Consult Note (Signed)
Cardiology Consultation:    Patient ID: Vicki Hernandez MRN: [id number]; DOB: 06/05/1928   Admit date: 08/22/2021 Date of Consult: 08/24/2021   PCP:  Lawerance Cruel, MD              [facility] Providers Cardiologist:  Larae Grooms, MD       Patient Profile:    Vicki Hernandez is a 88 y.o. female with a hx of supraventricular tachycardia, hypertension, peripheral vascular disease with prior carotid endarterectomy, hypertrophic cardiomyopathy, hypertension, hyperlipidemia admitted after a fall with resulting rib fractures for evaluation of chest pain at the request of Lorella Nimrod MD.   History of Present Illness:    Last echocardiogram March 2022 showed normal LV function, grade 1 diastolic dysfunction, flow acceleration of the basal septum without LVOT obstruction, mild left atrial enlargement.  Nuclear study April 2022 showed ejection fraction of 80%, no ischemia or infarction.  Patient typically does not have dyspnea on exertion, orthopnea, PND, pedal edema, syncope or exertional chest pain.  Patient was taking the garbage out and lost her balance.  She fell striking her right rib cage and presented for further evaluation.  She is found to have acute rib fractures.  Complained of chest pain and cardiology asked to evaluate.  Patient states that she has pain where she fell and fractured her ribs.  She also has pain in the left lateral chest area described as an ache.  It has been continuous since approximately 4 PM yesterday.  It increases with palpation.  She did have some nausea and vomiting yesterday but no diaphoresis or dyspnea.  The pain does not radiate.         Past Medical History:  Diagnosis Date   Accessory kidney     Angina      3 WEEKS   Arthritis      OSTEO    Atrial fibrillation ([place]) 2014   Carotid artery bruit     Complication of anesthesia      TAKES AWHILE TO WAKE UP    Dysrhythmia      TACHYCARDIA    GERD (gastroesophageal reflux disease)      H/O hiatal hernia     Heart murmur     Hyperlipidemia     Hypertension     Macular degeneration             Past Surgical History:  Procedure Laterality Date   ABDOMINAL HYSTERECTOMY   1974    TAH w/ BSO   APPENDECTOMY       CARDIOVASCULAR STRESS TEST        5 YRS AGO     DR H SMITH  (NOW SEES DR Irish Lack)   CAROTID ENDARTERECTOMY Left 03-25-11    cea   CATARACT EXTRACTION W/ INTRAOCULAR LENS  IMPLANT, BILATERAL       ENDARTERECTOMY   03/25/2011    Procedure: ENDARTERECTOMY CAROTID;  Surgeon: Mal Misty, MD;  Location: [facility] OR;  Service: Vascular;  Laterality: Left;  Left Carotid Endarterectomy with dacron patch angioplasty, with resection of redundant common carotid with primary reanastamosis   [place]   2012    Left Knee   [place]   2002, 2010    Lumbar disk/ spine surgeries X 2   By Dr. Rolena Infante   TONSILLECTOMY  Inpatient Medications: Scheduled Meds:  acetaminophen  1,000 mg Oral Q6H   aspirin EC  81 mg Oral Daily   docusate sodium  100 mg Oral BID   fentaNYL (SUBLIMAZE) injection  50 mcg Intravenous Once   fluticasone furoate-vilanterol  1 puff Inhalation Daily    And   umeclidinium bromide  1 puff Inhalation Daily   irbesartan  150 mg Oral Daily   lidocaine  1 patch Transdermal Q24H   lip balm   Topical BID   methocarbamol  500 mg Oral Q6H   nitroGLYCERIN         polyethylene glycol  17 g Oral BID   simvastatin  40 mg Oral QPM    Continuous Infusions:  albumin human     heparin 650 Units/hr (08/24/21 1405)   methocarbamol (ROBAXIN) IV     ondansetron (ZOFRAN) IV      PRN Meds: acetaminophen **OR** acetaminophen, albumin human, alum & mag hydroxide-simeth, artificial tears, bisacodyl, diphenhydrAMINE, fentaNYL (SUBLIMAZE) injection, guaiFENesin-dextromethorphan, magic mouthwash, menthol-cetylpyridinium, methocarbamol (ROBAXIN) IV, metoprolol tartrate, naphazoline-glycerin,  nitroGLYCERIN, nitroGLYCERIN, ondansetron (ZOFRAN) IV **OR** ondansetron (ZOFRAN) IV, oxyCODONE, phenol, polyethylene glycol, prochlorperazine, sodium chloride   Allergies:         Allergies  Allergen Reactions   Sulfa Antibiotics Itching and Hives   Codeine Nausea Only   Cymbalta [Duloxetine Hcl]        Unknown reaction   Fosamax [Alendronate]        Other reaction(s): upset hiatal hernia      Social History:   Social History         Socioeconomic History   Marital status: Married      Spouse name: Not on file   Number of children: Not on file   Years of education: Not on file   Highest education level: Not on file  Occupational History   Not on file  Tobacco Use   Smoking status: Former      Packs/day: 0.50      Types: Cigarettes      Quit date: 12/28/2015      Years since quitting: 5.6   Smokeless tobacco: Never  Vaping Use   Vaping Use: Never used  Substance and Sexual Activity   Alcohol use: Yes      Comment: social   Drug use: No   Sexual activity: Not on file  Other Topics Concern   Not on file  Social History Narrative   Not on file    Social Determinants of Health    Financial Resource Strain: Not on file  Food Insecurity: Not on file  Transportation Needs: Not on file  Physical Activity: Not on file  Stress: Not on file  Social Connections: Not on file  Intimate Partner Violence: Not on file    Family History:          Family History  Problem Relation Age of Onset   Hyperlipidemia Father     Hypertension Father     Heart disease Father          Heart Disease before age 64-  Bleeding problems   Heart attack Father     Stroke Mother     Heart disease Mother          After age 66      ROS:  Please see the history of present illness.  Pain in ribs from fall and back pain. All other ROS reviewed and negative.      Physical Exam/Data:  Vitals:    08/23/21 1550 08/23/21 2144 08/24/21 0650 08/24/21 1456  BP: (!) 197/99 (!)  174/72 (!) 180/108 (!) 154/99  Pulse: 86 83 (!) 110 (!) 102  Resp: 16 18 18 17   Temp: 97.7 F (36.5 C) (!) 97.5 F (36.4 C) 98.7 F (37.1 C) 97.9 F (36.6 C)  TempSrc: Oral Oral Oral Oral  SpO2: 95% 95% 96% 98%  Weight:          Height:              Intake/Output Summary (Last 24 hours) at 08/24/2021 1551 Last data filed at 08/24/2021 1524    Gross per 24 hour  Intake 280 ml  Output 1100 ml  Net -820 ml        08/22/2021    8:50 PM 05/01/2020   10:32 AM 03/18/2020    9:21 AM  Last 3 Weights  Weight (lbs) 118 lb 130 lb 130 lb 3.2 oz  Weight (kg) 53.524 kg 58.968 kg 59.058 kg     Body mass index is 23.05 kg/m.  General:  Well nourished, well developed, in no acute distress HEENT: normal Neck: no JVD Vascular: No carotid bruits; Distal pulses 2+ bilaterally Cardiac:  normal S1, S2; RRR; 1/6 systolic murmur Lungs:  clear to auscultation bilaterally, no wheezing, rhonchi or rales  Abd: soft, nontender, no hepatomegaly  Ext: no edema Musculoskeletal:  No deformities, BUE and BLE strength normal and equal Skin: warm and dry  Neuro:  CNs 2-12 intact, no focal abnormalities noted Psych:  Normal affect    EKG:  The EKG was personally reviewed and demonstrates: Sinus rhythm with PVCs and nonspecific ST changes. Telemetry: Patient not on telemetry but will be placed.   Laboratory Data:   High Sensitivity Troponin:   Last Labs      Recent Labs  Lab 08/24/21 0823  TROPONINIHS 109*       Chemistry Last Labs        Recent Labs  Lab 08/22/21 2250 08/23/21 0540 08/24/21 0823  NA 143 139 134*  K 4.3 4.0 3.9  CL 109 104 97*  CO2 28 29 23   GLUCOSE 127* 119* 130*  BUN 21 18 22   CREATININE 1.12* 1.03* 1.05*  CALCIUM 9.1 9.2 9.7  GFRNONAA 46* 51* 50*  ANIONGAP 6 6 14       Last Labs       Recent Labs  Lab 08/22/21 2250 08/24/21 0823  PROT 6.6 7.4  ALBUMIN 3.1* 3.5  AST 17 21  ALT 10 12  ALKPHOS 47 57  BILITOT 0.3 0.9        Hematology Last Labs         Recent Labs  Lab 08/22/21 2250 08/23/21 0540 08/24/21 0823  WBC 8.0 7.2 11.5*  RBC 3.82* 4.23 5.17*  HGB 11.4* 12.4 15.0  HCT 36.0 38.7 45.8  MCV 94.2 91.5 88.6  MCH 29.8 29.3 29.0  MCHC 31.7 32.0 32.8  RDW 15.4 15.1 14.6  PLT 215 226 297          Radiology/Studies:  CT CHEST ABDOMEN PELVIS W CONTRAST   Result Date: 08/24/2021 CLINICAL DATA:  88 year old female status post recent fall with right side rib fractures. EXAM: CT CHEST, ABDOMEN, AND PELVIS WITH CONTRAST TECHNIQUE: Multidetector CT imaging of the chest, abdomen and pelvis was performed following the standard protocol during bolus administration of intravenous contrast. RADIATION DOSE REDUCTION: This exam was performed according to the departmental dose-optimization program which includes automated exposure  control, adjustment of the mA and/or kV according to patient size and/or use of iterative reconstruction technique. CONTRAST:  74mL OMNIPAQUE IOHEXOL 300 MG/ML  SOLN COMPARISON:  Right rib series 08/22/2021. FINDINGS: CT CHEST FINDINGS Cardiovascular: Calcified coronary artery atherosclerosis. Calcified aortic atherosclerosis. Mildly tortuous thoracic aorta. No cardiomegaly or pericardial effusion. Major mediastinal vascular structures appear intact. Mediastinum/Nodes: Negative. No mediastinal hematoma, lymphadenopathy, mass. Lungs/Pleura: Trace layering right pleural effusion. No pneumothorax. Major airways are patent. Mild perihilar bronchiectasis. Mild bilateral lung scarring at the lung apices and lung bases. No consolidation or convincing pulmonary contusion. Musculoskeletal: Osteopenia. Lateral and posterior right 7th through 11th rib fractures. Seventh 8th and 9th posterolateral fractures are all mildly displaced. Superimposed nondisplaced fracture of the anterior right 7th rib on series 4, image 131. And there could also be nondisplaced right 3rd through 6th anterior rib fractures. No sternal fracture. Visible shoulder  osseous structures appear intact. Age indeterminate mild T7 compression fracture. Minor age indeterminate compression of the T4 superior endplate. CT ABDOMEN PELVIS FINDINGS Hepatobiliary: No discrete liver injury. But there is a small volume of free fluid in the gallbladder fossa (coronal image 44). Gallbladder is mildly distended the wall thickness remains normal and there is no other pericholecystic inflammation. Bile ducts are within normal limits for age. Pancreas: Negative except for some atrophy. Spleen: No splenic injury or perisplenic fluid. Adrenals/Urinary Tract: Adrenal glands and kidneys are within normal limits for age. Several small benign appearing renal cysts (no follow-up imaging recommended). Unremarkable urinary bladder. Stomach/Bowel: Decompressed rectum. Redundant gas distended sigmoid colon. But decompressed upstream sigmoid and descending colon. Small volume of simple density free fluid in the left gutter on series 3, image 72. And there is mild to moderate regional descending colon bowel wall thickening (series 3, image 81) which is fairly circumferential but along a short segment of only 4-5 cm. Upstream colon is nondilated although there is abundant retained stool in redundant transverse colon. Negative right colon. No right gutter fluid. No pneumoperitoneum identified. No dilated small bowel. Small volume fluid in the stomach. Duodenum is decompressed. Vascular/Lymphatic: Infrarenal abdominal aortic aneurysm measures up to 4.4 cm diameter with abundant mural plaque or thrombus (series 3, image 74). Underlying Calcified aortic atherosclerosis. Major arterial structures including the IMA remain patent. Iliofemoral calcified atherosclerosis. Portal venous system appears to be patent. No lymphadenopathy. Reproductive: Surgically absent uterus. Diminutive or absent ovaries. Other: Scattered small volume of free fluid layering in the pelvis (series 3, image 103). Musculoskeletal: Osteopenia.  Lumbar vertebrae appear intact with mild degenerative spondylolisthesis L3-L4 and L4-L5. Sacrum, SI joints, pelvis and proximal femurs appear intact. Trace ventral right abdominal wall soft tissue gas appears related to subcutaneous injection on series 3, image 90. And there is similar trace gas within the atrophied left abdominal wall muscle on image 91. IMPRESSION: 1. Osteopenia with definite post-traumatic fractures of right ribs 7 through 11 (7th rib fractured in two places). Possible nondisplaced right 3rd through 6th rib fractures also. Trace right pleural effusion. No pneumothorax or pulmonary contusion. 2. Abnormal but nonspecific free fluid in the left gutter and pelvis. Bowel Injury is not excluded, and there is abnormal mural thickening of the descending colon in a segment of 4-5 cm. Differential considerations include Adenocarcinoma and focal Colitis. 3. No other acute traumatic injury identified; age indeterminate mild T4 and T7 compression fractures. 4. 4.4 cm infrarenal abdominal aortic aneurysm. Recommend follow-up every 12 months and Vascular Consultation. Aortic aneurysm NOS (ICD10-I71.9). Aortic Atherosclerosis (ICD10-I70.0). Reference: J Am Coll Radiol 2013;10:789-794.  Electronically Signed   By: Genevie Ann M.D.   On: 08/24/2021 09:28    DG Abd 2 Views   Result Date: 08/23/2021 CLINICAL DATA:  Multiple rib fractures EXAM: ABDOMEN - 2 VIEW COMPARISON:  Radiograph 08/22/2021 FINDINGS: There is gaseous distension of bowel in the lower abdomen in a nonobstructive pattern. No evidence of free intraperitoneal gas. There are minimally displaced fractures of the right lateral seventh and eighth ribs, and possible the ninth rib. IMPRESSION: Gaseous distention of bowel in the lower abdomen in a nonobstructive pattern. Minimally displaced fracture of the right lateral seventh and eighth ribs and possibly the right ninth rib. Electronically Signed   By: Maurine Simmering M.D.   On: 08/23/2021 13:27    DG Ribs  Unilateral W/Chest Right   Result Date: 08/22/2021 CLINICAL DATA:  Right rib pain EXAM: RIGHT RIBS AND CHEST - 3+ VIEW COMPARISON:  None Available. FINDINGS: There is an acute minimally displaced fracture of the right 7 and 8 ribs laterally. Lungs are clear. No pneumothorax or pleural effusion. Cardiac size within normal limits. Pulmonary vascularity is normal. IMPRESSION: Acute minimally displaced fractures of the right seventh and eighth ribs laterally. No pneumothorax. Electronically Signed   By: Fidela Salisbury M.D.   On: 08/22/2021 22:11       Assessment and Plan:    Chest pain-symptoms are very atypical.  They are in the left lateral chest and there is increased with palpation.  Likely musculoskeletal from recent fall.  Troponin minimally elevated but no diagnostic ECG changes.  Would continue to cycle troponins.  Schedule echocardiogram to assess LV function.  If there is no clear trend and LV function normal would not pursue further cardiac evaluation. History of carotid artery disease/carotid endarterectomy-continue aspirin and statin. History of SVT-patient is in sinus rhythm. Hypertension-patient's blood pressure is elevated likely exacerbated by ongoing pain.  We will follow and advance medications as needed. History of HOCM-most recent echocardiogram showed flow acceleration at the proximal septum. Abdominal aortic aneurysm-we will need follow-up ultrasound as an outpatient in one year. Abnormal abdominal CT-abnormal thickening of the descending colon.  Will need follow-up with primary care.     For questions or updates, please contact [place] Please consult [email or web address] for contact info under      Signed, Kirk Ruths, MD  08/24/2021 3:51 PM

## 2021-08-24 NOTE — Progress Notes (Signed)
Mobility Specialist Progress Note:   08/24/21 1512  Mobility  Activity Transferred from chair to bed  Level of Assistance Moderate assist, patient does 50-74%  Assistive Device Front wheel walker  Distance Ambulated (ft) 4 ft  Activity Response Tolerated poorly  $Mobility charge 1 Mobility   Pt in chair and agreeable. After pt stood up from chair, c/o severe nausea and pain, resulting in vomiting. Pt able to take steps towards bed. Pt sitting EOB with NT in room.   Peder Allums Mobility Specialist-Acute Rehab Secure Chat only

## 2021-08-24 NOTE — TOC Progression Note (Addendum)
Transition of Care [facility]) - Progression Note    Patient Details  Name: Vicki Hernandez MRN: [id number] Date of Birth: 11-Dec-1928  Transition of [facility] CM/SW Contact  Ivette Loyal, [place] Phone Number: 08/24/2021, 12:13 PM  Clinical Narrative:    CSW spoke with pt daughter to confirm that there is a bed available   at [facility]. CSW will start auth, the room wont be available until Wednesday.   Auth pending.   Expected Discharge Plan: Skilled Nursing Facility Barriers to Discharge: Continued Medical Work up  Expected Discharge Plan and Services Expected Discharge Plan: Skilled Nursing Facility In-house Referral: Clinical Social Work Discharge Planning Services: CM Consult   Living arrangements for the past 2 months: Independent Living Facility                   DME Agency: NA       HH Arranged: NA           Social Determinants of Health (SDOH) Interventions    Readmission Risk Interventions     No data to display

## 2021-08-24 NOTE — Progress Notes (Signed)
Pt given incentive spirometer. Educated on how to use.

## 2021-08-24 NOTE — Progress Notes (Signed)
ANTICOAGULATION CONSULT NOTE  Pharmacy Consult for Heparin Indication: chest pain/ACS  Allergies  Allergen Reactions   Sulfa Antibiotics Itching and Hives   Codeine Nausea Only   Cymbalta [Duloxetine Hcl]     Unknown reaction   Fosamax [Alendronate]     Other reaction(s): upset hiatal hernia    Patient Measurements: Height: 5' (152.4 cm) Weight: 53.5 kg (118 lb) IBW/kg (Calculated) : 45.5  Heparin Dosing Weight: 54 kg  Vital Signs: Temp: 97.9 F (36.6 C) (08/08 1456) Temp Source: Oral (08/08 1456) BP: 154/99 (08/08 1456) Pulse Rate: 102 (08/08 1456)  Labs: Recent Labs    08/22/21 2250 08/23/21 0540 08/24/21 0823 08/24/21 1548 08/24/21 2138  HGB 11.4* 12.4 15.0  --   --   HCT 36.0 38.7 45.8  --   --   PLT 215 226 297  --   --   HEPARINUNFRC  --   --   --   --  0.89*  CREATININE 1.12* 1.03* 1.05*  --   --   TROPONINIHS  --   --  109* 247*  --      Estimated Creatinine Clearance: 25.1 mL/min (A) (by C-G formula based on SCr of 1.05 mg/dL (H)).   Assessment: 88 year old female history of COPD, hypertension, CKD stage IIIa, reflux presented to after a fall at home. Now with concerns for ACS/CP. Pharmacy consulted to initiate heparin infusion.   Heparin level 0.89 (supratherapeutic) on infusion at 650 units/hr. No bleeding noted.  Goal of Therapy:  Heparin level 0.3-0.7 units/ml Monitor platelets by anticoagulation protocol: Yes   Plan:  Decrease heparin infusion to 550 units/hr Will f/u 8 hr heparin level   Thank you for allowing pharmacy to be a part of this patient's care.  Christoper Fabian, PharmD, BCPS Please see amion for complete clinical pharmacist phone list 08/24/2021 10:35 PM

## 2021-08-24 NOTE — Progress Notes (Signed)
Patient declined afternoon mobility due to pain. Will try again later

## 2021-08-24 NOTE — Progress Notes (Signed)
Date and time results received: 08/24/21 1227 (use smartphrase ".now" to insert current time)  Test: troponin Critical Value: 109  Name of Provider Notified: Amin,Sumayya,MD paged at [phone]  Orders Received? Or Actions Taken?:  waiting new orders

## 2021-08-24 NOTE — Progress Notes (Signed)
Nitro and morphine given per Tilman Neat, MD request

## 2021-08-24 NOTE — Progress Notes (Signed)
Tilman Neat, MD paged about critical lab value

## 2021-08-24 NOTE — Progress Notes (Signed)
Patient will be started on heparin per [facility]

## 2021-08-24 NOTE — Progress Notes (Signed)
EKG completed and placed in drawer beside patient room

## 2021-08-24 NOTE — Progress Notes (Signed)
Patient transported to CT via transportation service.

## 2021-08-24 NOTE — Progress Notes (Signed)
1x vomitus containing food sediments.Stable at the moment. Will keep monitoring.

## 2021-08-24 NOTE — Progress Notes (Signed)
, PROGRESS NOTE    Vicki Hernandez  [id number] DOB: 31-Jan-1928 DOA: 08/22/2021 PCP: Lawerance Cruel, MD   Brief Narrative:  88 year old female history of COPD, hypertension, CKD stage IIIa, reflux presents to the ER today after a fall at home.  Patient was try to take the garbage bag out of the garbage can.  She lost her balance.  She struck the right side of her rib cage against the edge of the counter.  She had immediate pain.   Patient brought to ER by EMS.   Rib imaging demonstrates acute fracture of the right seventh and eighth ribs.  No pneumothorax.   EDP discussed the case with trauma surgery Dr. Johney Maine.  He recommended patient be transferred under the hospitalist service to [facility]  After admission pt with significant pain, daughter spoke with SW and indicated they wanted to pursue SNF/STR  8/8: Patient developed left-sided chest pain, nonradiating with some associated nausea but no vomiting or shortness of breath.  Trauma surgery did CT chest, abdomen and pelvis which shows multiple rib fractures from 3- 11.  Also shows nonacute T4 and T7 compression fractures.  Also noted to have nonspecific free fluid in the left gutter and pelvis.  There was an abnormal mural thickening of the descending colon with differential including adenocarcinoma versus focal colitis, will need outpatient follow-up. The also noted 4.4 cm infrarenal abdominal aortic aneurysm which will need follow-up in 12 months. Troponin elevated at 109, EKG with ST segment depression in the inferolateral leads and premature ventricular contractions.  Starting her on heparin infusion and cardiology was consulted.  Discussed her CODE STATUS with daughter, she will discuss with her and husband as she is not appropriate for full code based on her age, underlying comorbidities and multiple rib fractures.  Subjective: Patient was complaining of left-sided chest pain which is new review.  Continue to have right-sided  chest pain secondary to injuries.  Left-sided chest pain was nonradiating, associated with some mild nausea no vomiting or shortness of breath.  Assessment & Plan:   Principal Problem:   Multiple fractures of ribs, right side, initial encounter for closed fracture Active Problems:   Multiple rib fractures - right 7-8   Essential hypertension, benign   Chronic obstructive pulmonary disease, unspecified ([place])   Fall  NSTEMI.  New onset left-sided chest pain, EKG with ST depression in inferolateral leads which seems new.  Initial troponin at 109. -Start her on heparin infusion. -Trend troponin -Cardiology consult -Sublingual nitroglycerin as needed. -One-time dose of morphine if needed.   Multiple rib fractures - right 3-11  Trauma service saw in consultation. Continue with pain control. Family requested SNF/STR   Fall Pt fell over trying to remove garbage bag from garbage can. Did not fall to floor but right her right rib cage against counter edge. PT OT are recommending SNF.   Chronic obstructive pulmonary disease, unspecified ([place]) Stable. Not exacerbated.   Essential hypertension, benign Continue home po htn meds.     DVT prophylaxis: SQ Heparin Code Status: Full Code    Code Status Orders  (From admission, onward)           Start     Ordered   08/23/21 0426  Full code  Continuous        08/23/21 0425           Code Status History     Date Active Date Inactive Code Status Order ID Comments User Context   08/22/2021  2357 08/23/2021 0425 Full Code [id number]  Carollee Herter, DO ED   03/25/2011 1509 03/26/2011 1324 Full Code [id number]  Celesta Gentile, RN Inpatient      Advance Directive Documentation    Flowsheet Row Most Recent Value  Type of Advance Directive Healthcare Power of [place], Living will, Out of facility DNR (pink MOST or yellow form)  Pre-existing out of facility DNR order (yellow form or pink MOST form) Pink Most/Yellow Form available -  Physician notified to receive inpatient order  "MOST" Form in Place? --      Family Communication:   Disposition Plan:   PAIN NOT CONTROLLED REQUIRING IV PAIN MEDS Consults called: Trauma surgery, cardiology Admission status: Observation   Procedures:  CT CHEST ABDOMEN PELVIS W CONTRAST  Result Date: 08/24/2021 CLINICAL DATA:  88 year old female status post recent fall with right side rib fractures. EXAM: CT CHEST, ABDOMEN, AND PELVIS WITH CONTRAST TECHNIQUE: Multidetector CT imaging of the chest, abdomen and pelvis was performed following the standard protocol during bolus administration of intravenous contrast. RADIATION DOSE REDUCTION: This exam was performed according to the departmental dose-optimization program which includes automated exposure control, adjustment of the mA and/or kV according to patient size and/or use of iterative reconstruction technique. CONTRAST:  43mL OMNIPAQUE IOHEXOL 300 MG/ML  SOLN COMPARISON:  Right rib series 08/22/2021. FINDINGS: CT CHEST FINDINGS Cardiovascular: Calcified coronary artery atherosclerosis. Calcified aortic atherosclerosis. Mildly tortuous thoracic aorta. No cardiomegaly or pericardial effusion. Major mediastinal vascular structures appear intact. Mediastinum/Nodes: Negative. No mediastinal hematoma, lymphadenopathy, mass. Lungs/Pleura: Trace layering right pleural effusion. No pneumothorax. Major airways are patent. Mild perihilar bronchiectasis. Mild bilateral lung scarring at the lung apices and lung bases. No consolidation or convincing pulmonary contusion. Musculoskeletal: Osteopenia. Lateral and posterior right 7th through 11th rib fractures. Seventh 8th and 9th posterolateral fractures are all mildly displaced. Superimposed nondisplaced fracture of the anterior right 7th rib on series 4, image 131. And there could also be nondisplaced right 3rd through 6th anterior rib fractures. No sternal fracture. Visible shoulder osseous structures appear  intact. Age indeterminate mild T7 compression fracture. Minor age indeterminate compression of the T4 superior endplate. CT ABDOMEN PELVIS FINDINGS Hepatobiliary: No discrete liver injury. But there is a small volume of free fluid in the gallbladder fossa (coronal image 44). Gallbladder is mildly distended the wall thickness remains normal and there is no other pericholecystic inflammation. Bile ducts are within normal limits for age. Pancreas: Negative except for some atrophy. Spleen: No splenic injury or perisplenic fluid. Adrenals/Urinary Tract: Adrenal glands and kidneys are within normal limits for age. Several small benign appearing renal cysts (no follow-up imaging recommended). Unremarkable urinary bladder. Stomach/Bowel: Decompressed rectum. Redundant gas distended sigmoid colon. But decompressed upstream sigmoid and descending colon. Small volume of simple density free fluid in the left gutter on series 3, image 72. And there is mild to moderate regional descending colon bowel wall thickening (series 3, image 81) which is fairly circumferential but along a short segment of only 4-5 cm. Upstream colon is nondilated although there is abundant retained stool in redundant transverse colon. Negative right colon. No right gutter fluid. No pneumoperitoneum identified. No dilated small bowel. Small volume fluid in the stomach. Duodenum is decompressed. Vascular/Lymphatic: Infrarenal abdominal aortic aneurysm measures up to 4.4 cm diameter with abundant mural plaque or thrombus (series 3, image 74). Underlying Calcified aortic atherosclerosis. Major arterial structures including the IMA remain patent. Iliofemoral calcified atherosclerosis. Portal venous system appears to be patent. No lymphadenopathy. Reproductive: Surgically absent uterus.  Diminutive or absent ovaries. Other: Scattered small volume of free fluid layering in the pelvis (series 3, image 103). Musculoskeletal: Osteopenia. Lumbar vertebrae appear  intact with mild degenerative spondylolisthesis L3-L4 and L4-L5. Sacrum, SI joints, pelvis and proximal femurs appear intact. Trace ventral right abdominal wall soft tissue gas appears related to subcutaneous injection on series 3, image 90. And there is similar trace gas within the atrophied left abdominal wall muscle on image 91. IMPRESSION: 1. Osteopenia with definite post-traumatic fractures of right ribs 7 through 11 (7th rib fractured in two places). Possible nondisplaced right 3rd through 6th rib fractures also. Trace right pleural effusion. No pneumothorax or pulmonary contusion. 2. Abnormal but nonspecific free fluid in the left gutter and pelvis. Bowel Injury is not excluded, and there is abnormal mural thickening of the descending colon in a segment of 4-5 cm. Differential considerations include Adenocarcinoma and focal Colitis. 3. No other acute traumatic injury identified; age indeterminate mild T4 and T7 compression fractures. 4. 4.4 cm infrarenal abdominal aortic aneurysm. Recommend follow-up every 12 months and Vascular Consultation. Aortic aneurysm NOS (ICD10-I71.9). Aortic Atherosclerosis (ICD10-I70.0). Reference: J Am Coll Radiol 2013;10:789-794. Electronically Signed   By: Odessa Fleming M.D.   On: 08/24/2021 09:28   DG Abd 2 Views  Result Date: 08/23/2021 CLINICAL DATA:  Multiple rib fractures EXAM: ABDOMEN - 2 VIEW COMPARISON:  Radiograph 08/22/2021 FINDINGS: There is gaseous distension of bowel in the lower abdomen in a nonobstructive pattern. No evidence of free intraperitoneal gas. There are minimally displaced fractures of the right lateral seventh and eighth ribs, and possible the ninth rib. IMPRESSION: Gaseous distention of bowel in the lower abdomen in a nonobstructive pattern. Minimally displaced fracture of the right lateral seventh and eighth ribs and possibly the right ninth rib. Electronically Signed   By: Caprice Renshaw M.D.   On: 08/23/2021 13:27   DG Ribs Unilateral W/Chest  Right  Result Date: 08/22/2021 CLINICAL DATA:  Right rib pain EXAM: RIGHT RIBS AND CHEST - 3+ VIEW COMPARISON:  None Available. FINDINGS: There is an acute minimally displaced fracture of the right 7 and 8 ribs laterally. Lungs are clear. No pneumothorax or pleural effusion. Cardiac size within normal limits. Pulmonary vascularity is normal. IMPRESSION: Acute minimally displaced fractures of the right seventh and eighth ribs laterally. No pneumothorax. Electronically Signed   By: Helyn Numbers M.D.   On: 08/22/2021 22:11     Objective: Vitals:   08/23/21 0812 08/23/21 1550 08/23/21 2144 08/24/21 0650  BP:  (!) 197/99 (!) 174/72 (!) 180/108  Pulse:  86 83 (!) 110  Resp:  16 18 18   Temp:  97.7 F (36.5 C) (!) 97.5 F (36.4 C) 98.7 F (37.1 C)  TempSrc:  Oral Oral Oral  SpO2: 96% 95% 95% 96%  Weight:      Height:        Intake/Output Summary (Last 24 hours) at 08/24/2021 1406 Last data filed at 08/24/2021 0650 Gross per 24 hour  Intake --  Output 1900 ml  Net -1900 ml   Filed Weights   08/22/21 2050  Weight: 53.5 kg    Examination: General.  Frail elderly lady, in no acute distress. Pulmonary.  Lungs clear bilaterally, normal respiratory effort. CV.  Regular rate and rhythm, no JVD, rub or murmur. Abdomen.  Soft, nontender, nondistended, BS positive. CNS.  Alert and oriented .  No focal neurologic deficit. Extremities.  No edema, no cyanosis, pulses intact and symmetrical. Psychiatry.  Judgment and insight appears normal.  Data Reviewed: I have personally reviewed following labs and imaging studies  CBC: Recent Labs  Lab 08/22/21 2250 08/23/21 0540 08/24/21 0823  WBC 8.0 7.2 11.5*  NEUTROABS 5.7  --   --   HGB 11.4* 12.4 15.0  HCT 36.0 38.7 45.8  MCV 94.2 91.5 88.6  PLT 215 226 [id number]    Basic Metabolic Panel: Recent Labs  Lab 08/22/21 2250 08/23/21 0540 08/24/21 0823  NA 143 139 134*  K 4.3 4.0 3.9  CL 109 104 97*  CO2 28 29 23   GLUCOSE 127* 119* 130*   BUN 21 18 22   CREATININE 1.12* 1.03* 1.05*  CALCIUM 9.1 9.2 9.7    GFR: Estimated Creatinine Clearance: 25.1 mL/min (A) (by C-G formula based on SCr of 1.05 mg/dL (H)). Liver Function Tests: Recent Labs  Lab 08/22/21 2250 08/24/21 0823  AST 17 21  ALT 10 12  ALKPHOS 47 57  BILITOT 0.3 0.9  PROT 6.6 7.4  ALBUMIN 3.1* 3.5    No results for input(s): "LIPASE", "AMYLASE" in the last 168 hours. No results for input(s): "AMMONIA" in the last 168 hours. Coagulation Profile: No results for input(s): "INR", "PROTIME" in the last 168 hours. Cardiac Enzymes: No results for input(s): "CKTOTAL", "CKMB", "CKMBINDEX", "TROPONINI" in the last 168 hours. BNP (last 3 results) No results for input(s): "PROBNP" in the last 8760 hours. HbA1C: No results for input(s): "HGBA1C" in the last 72 hours. CBG: No results for input(s): "GLUCAP" in the last 168 hours. Lipid Profile: No results for input(s): "CHOL", "HDL", "LDLCALC", "TRIG", "CHOLHDL", "LDLDIRECT" in the last 72 hours. Thyroid Function Tests: No results for input(s): "TSH", "T4TOTAL", "FREET4", "T3FREE", "THYROIDAB" in the last 72 hours. Anemia Panel: No results for input(s): "VITAMINB12", "FOLATE", "FERRITIN", "TIBC", "IRON", "RETICCTPCT" in the last 72 hours. Sepsis Labs: No results for input(s): "PROCALCITON", "LATICACIDVEN" in the last 168 hours.  No results found for this or any previous visit (from the past 240 hour(s)).       Radiology Studies: CT CHEST ABDOMEN PELVIS W CONTRAST  Result Date: 08/24/2021 CLINICAL DATA:  88 year old female status post recent fall with right side rib fractures. EXAM: CT CHEST, ABDOMEN, AND PELVIS WITH CONTRAST TECHNIQUE: Multidetector CT imaging of the chest, abdomen and pelvis was performed following the standard protocol during bolus administration of intravenous contrast. RADIATION DOSE REDUCTION: This exam was performed according to the departmental dose-optimization program which includes  automated exposure control, adjustment of the mA and/or kV according to patient size and/or use of iterative reconstruction technique. CONTRAST:  54mL OMNIPAQUE IOHEXOL 300 MG/ML  SOLN COMPARISON:  Right rib series 08/22/2021. FINDINGS: CT CHEST FINDINGS Cardiovascular: Calcified coronary artery atherosclerosis. Calcified aortic atherosclerosis. Mildly tortuous thoracic aorta. No cardiomegaly or pericardial effusion. Major mediastinal vascular structures appear intact. Mediastinum/Nodes: Negative. No mediastinal hematoma, lymphadenopathy, mass. Lungs/Pleura: Trace layering right pleural effusion. No pneumothorax. Major airways are patent. Mild perihilar bronchiectasis. Mild bilateral lung scarring at the lung apices and lung bases. No consolidation or convincing pulmonary contusion. Musculoskeletal: Osteopenia. Lateral and posterior right 7th through 11th rib fractures. Seventh 8th and 9th posterolateral fractures are all mildly displaced. Superimposed nondisplaced fracture of the anterior right 7th rib on series 4, image 131. And there could also be nondisplaced right 3rd through 6th anterior rib fractures. No sternal fracture. Visible shoulder osseous structures appear intact. Age indeterminate mild T7 compression fracture. Minor age indeterminate compression of the T4 superior endplate. CT ABDOMEN PELVIS FINDINGS Hepatobiliary: No discrete liver injury. But there is a small  volume of free fluid in the gallbladder fossa (coronal image 44). Gallbladder is mildly distended the wall thickness remains normal and there is no other pericholecystic inflammation. Bile ducts are within normal limits for age. Pancreas: Negative except for some atrophy. Spleen: No splenic injury or perisplenic fluid. Adrenals/Urinary Tract: Adrenal glands and kidneys are within normal limits for age. Several small benign appearing renal cysts (no follow-up imaging recommended). Unremarkable urinary bladder. Stomach/Bowel: Decompressed  rectum. Redundant gas distended sigmoid colon. But decompressed upstream sigmoid and descending colon. Small volume of simple density free fluid in the left gutter on series 3, image 72. And there is mild to moderate regional descending colon bowel wall thickening (series 3, image 81) which is fairly circumferential but along a short segment of only 4-5 cm. Upstream colon is nondilated although there is abundant retained stool in redundant transverse colon. Negative right colon. No right gutter fluid. No pneumoperitoneum identified. No dilated small bowel. Small volume fluid in the stomach. Duodenum is decompressed. Vascular/Lymphatic: Infrarenal abdominal aortic aneurysm measures up to 4.4 cm diameter with abundant mural plaque or thrombus (series 3, image 74). Underlying Calcified aortic atherosclerosis. Major arterial structures including the IMA remain patent. Iliofemoral calcified atherosclerosis. Portal venous system appears to be patent. No lymphadenopathy. Reproductive: Surgically absent uterus. Diminutive or absent ovaries. Other: Scattered small volume of free fluid layering in the pelvis (series 3, image 103). Musculoskeletal: Osteopenia. Lumbar vertebrae appear intact with mild degenerative spondylolisthesis L3-L4 and L4-L5. Sacrum, SI joints, pelvis and proximal femurs appear intact. Trace ventral right abdominal wall soft tissue gas appears related to subcutaneous injection on series 3, image 90. And there is similar trace gas within the atrophied left abdominal wall muscle on image 91. IMPRESSION: 1. Osteopenia with definite post-traumatic fractures of right ribs 7 through 11 (7th rib fractured in two places). Possible nondisplaced right 3rd through 6th rib fractures also. Trace right pleural effusion. No pneumothorax or pulmonary contusion. 2. Abnormal but nonspecific free fluid in the left gutter and pelvis. Bowel Injury is not excluded, and there is abnormal mural thickening of the descending colon  in a segment of 4-5 cm. Differential considerations include Adenocarcinoma and focal Colitis. 3. No other acute traumatic injury identified; age indeterminate mild T4 and T7 compression fractures. 4. 4.4 cm infrarenal abdominal aortic aneurysm. Recommend follow-up every 12 months and Vascular Consultation. Aortic aneurysm NOS (ICD10-I71.9). Aortic Atherosclerosis (ICD10-I70.0). Reference: J Am Coll Radiol [id number]. Electronically Signed   By: Genevie Ann M.D.   On: 08/24/2021 09:28   DG Abd 2 Views  Result Date: 08/23/2021 CLINICAL DATA:  Multiple rib fractures EXAM: ABDOMEN - 2 VIEW COMPARISON:  Radiograph 08/22/2021 FINDINGS: There is gaseous distension of bowel in the lower abdomen in a nonobstructive pattern. No evidence of free intraperitoneal gas. There are minimally displaced fractures of the right lateral seventh and eighth ribs, and possible the ninth rib. IMPRESSION: Gaseous distention of bowel in the lower abdomen in a nonobstructive pattern. Minimally displaced fracture of the right lateral seventh and eighth ribs and possibly the right ninth rib. Electronically Signed   By: Maurine Simmering M.D.   On: 08/23/2021 13:27   DG Ribs Unilateral W/Chest Right  Result Date: 08/22/2021 CLINICAL DATA:  Right rib pain EXAM: RIGHT RIBS AND CHEST - 3+ VIEW COMPARISON:  None Available. FINDINGS: There is an acute minimally displaced fracture of the right 7 and 8 ribs laterally. Lungs are clear. No pneumothorax or pleural effusion. Cardiac size within normal limits. Pulmonary vascularity is normal.  IMPRESSION: Acute minimally displaced fractures of the right seventh and eighth ribs laterally. No pneumothorax. Electronically Signed   By: Fidela Salisbury M.D.   On: 08/22/2021 22:11        Scheduled Meds:  acetaminophen  1,000 mg Oral Q6H   aspirin EC  81 mg Oral Daily   docusate sodium  100 mg Oral BID   fentaNYL (SUBLIMAZE) injection  50 mcg Intravenous Once   fluticasone furoate-vilanterol  1 puff  Inhalation Daily   And   umeclidinium bromide  1 puff Inhalation Daily   irbesartan  150 mg Oral Daily   lidocaine  1 patch Transdermal Q24H   lip balm   Topical BID   methocarbamol  500 mg Oral Q6H   nitroGLYCERIN       polyethylene glycol  17 g Oral BID   simvastatin  40 mg Oral QPM   Continuous Infusions:  albumin human     heparin 650 Units/hr (08/24/21 1405)   methocarbamol (ROBAXIN) IV     ondansetron (ZOFRAN) IV       LOS: 0 days    Time spent: 45  min  This record has been created using Systems analyst. Errors have been sought and corrected,but may not always be located. Such creation errors do not reflect on the standard of care.   Lorella Nimrod, MD [facility]  If 7PM-7AM, please contact night-coverage  08/24/2021, 2:06 PM

## 2021-08-24 NOTE — Progress Notes (Addendum)
Physical Therapy Treatment Patient Details Name: Vicki Hernandez MRN: [id number] DOB: 12-18-1928 Today's Date: 08/24/2021   History of Present Illness Pt is 88 year old female history of COPD, hypertension, CKD stage IIIa, reflux presents to the ER today after a fall at home. Patient was try to take the garbage bag out of the garbage can. She lost her balance. She struck the right side of her rib cage against the edge of the counter.  She had immediate pain. Patient brought to ER by EMS.  Rib imaging demonstrates acute fracture of the right seventh and eighth ribs (CT showed 7-11 rib fractures on 8/8). No pneumothorax.    PT Comments    Pt reports she just got back to bed from chair and not willing to mobilize back out of bed. Pt agreeable to bed exercises. Focused on quad and glute strengthening but pt unable to tolerate SAQ's when attempted. Also, incorporated deep breathing technique. Pt will continue to benefit from skilled, acute care physical therapy interventions to maximize her current level of function and progress towards established goals.    Recommendations for follow up therapy are one component of a multi-disciplinary discharge planning process, led by the attending physician.  Recommendations may be updated based on patient status, additional functional criteria and insurance authorization.  Follow Up Recommendations  Skilled nursing-short term rehab (<3 hours/day) Can patient physically be transported by private vehicle: Yes   Assistance Recommended at Discharge Frequent or constant Supervision/Assistance  Patient can return home with the following A little help with walking and/or transfers;A little help with bathing/dressing/bathroom;Assistance with cooking/housework;Assist for transportation   Equipment Recommendations  None recommended by PT    Recommendations for Other Services       Precautions / Restrictions Precautions Precautions: Fall Restrictions Weight  Bearing Restrictions: No     Mobility  Bed Mobility                    Transfers                        Ambulation/Gait                   Stairs             Wheelchair Mobility    Modified Rankin (Stroke Patients Only)       Balance Overall balance assessment: Needs assistance Sitting-balance support: Bilateral upper extremity supported Sitting balance-Leahy Scale: Fair     Standing balance support: Reliant on assistive device for balance Standing balance-Leahy Scale: Poor                              Cognition Arousal/Alertness: Awake/alert Behavior During Therapy: WFL for tasks assessed/performed Overall Cognitive Status: Within Functional Limits for tasks assessed                                          Exercises General Exercises - Lower Extremity Quad Sets: Both, 10 reps, Supine Gluteal Sets: Both, 10 reps, Supine Heel Slides: Right, Left, 10 reps, Supine Hip ABduction/ADduction: Right, Left, 10 reps, Supine Other Exercises Other Exercises: deep breathing breaths x 6    General Comments General comments (skin integrity, edema, etc.): HR stable on RA      Pertinent Vitals/Pain Pain Assessment Pain Assessment: 0-10 Pain Score: 2  Pain Location: R side of back at ribs Pain Descriptors / Indicators: Sharp, Moaning, Grimacing, Guarding Pain Intervention(s): Limited activity within patient's tolerance, Monitored during session, Utilized relaxation techniques    Home Living                          Prior Function            PT Goals (current goals can now be found in the care plan section) Acute Rehab PT Goals Patient Stated Goal: none stated PT Goal Formulation: With patient Time For Goal Achievement: 08/30/21 Potential to Achieve Goals: Good Progress towards PT goals: Progressing toward goals (slow 2/2 nausea and pain)    Frequency    Min 3X/week      PT Plan  Current plan remains appropriate    Co-evaluation              AM-PAC PT "6 Clicks" Mobility   Outcome Measure  Help needed turning from your back to your side while in a flat bed without using bedrails?: A Little Help needed moving from lying on your back to sitting on the side of a flat bed without using bedrails?: A Little Help needed moving to and from a bed to a chair (including a wheelchair)?: A Little Help needed standing up from a chair using your arms (e.g., wheelchair or bedside chair)?: A Little Help needed to walk in hospital room?: A Lot Help needed climbing 3-5 steps with a railing? : Total 6 Click Score: 15    End of Session   Activity Tolerance: Patient limited by pain Patient left: in chair;with call bell/phone within reach;with chair alarm set Nurse Communication: Mobility status;Patient requests pain meds PT Visit Diagnosis: Other abnormalities of gait and mobility (R26.89);Muscle weakness (generalized) (M62.81);Pain Pain - Right/Left: Right Pain - part of body:  (back/ribs)     Time: 1535-1550 PT Time Calculation (min) (ACUTE ONLY): 15 min  Charges:  $Therapeutic Exercise: 8-22 mins                     Tana Coast, PT    [employer] 08/24/2021, 4:02 PM

## 2021-08-24 NOTE — Progress Notes (Signed)
Occupational Therapy Treatment Patient Details Name: BIRGIT NOWLING MRN: [id number] DOB: 1928-12-14 Today's Date: 08/24/2021   History of present illness Pt is a 88 y/o female presenting after a fall. Found with R rib fxs (7-8). PMH includes: OA, Afib, HTN, endarterectomy, prior spine surgery.   OT comments  Pt supine in bed and agreeable to OT session, pt pain limiting mobility and independence. Pt currently requires min assist for bed mobility and transfers using RW, cueing for hand placement. Setup for grooming in recliner, but mod assist for LB ADLs.  Became nauseated when standing and taking steps to recliner, RN notified and aware.  Updated dc plan to SNF, to optimize tolerance, safety and independence prior to returning to independent living.  Will follow.     Recommendations for follow up therapy are one component of a multi-disciplinary discharge planning process, led by the attending physician.  Recommendations may be updated based on patient status, additional functional criteria and insurance authorization.    Follow Up Recommendations  Skilled nursing-short term rehab (<3 hours/day)    Assistance Recommended at Discharge Frequent or constant Supervision/Assistance  Patient can return home with the following  A little help with walking and/or transfers;A lot of help with bathing/dressing/bathroom;Assistance with cooking/housework;Assist for transportation;Help with stairs or ramp for entrance   Equipment Recommendations  BSC/3in1    Recommendations for Other Services      Precautions / Restrictions Precautions Precautions: Fall Restrictions Weight Bearing Restrictions: No       Mobility Bed Mobility Overal bed mobility: Needs Assistance Bed Mobility: Rolling, Sidelying to Sit Rolling: Min assist Sidelying to sit: Max assist            Transfers Overall transfer level: Needs assistance Equipment used: Rolling walker (2 wheels) Transfers: Sit to/from Stand,  Bed to chair/wheelchair/BSC Sit to Stand: Min assist           General transfer comment: cueing for hand placement and safety     Balance Overall balance assessment: Needs assistance Sitting-balance support: No upper extremity supported, Feet supported Sitting balance-Leahy Scale: Fair     Standing balance support: Bilateral upper extremity supported, During functional activity Standing balance-Leahy Scale: Poor Standing balance comment: relies on BUE and external support                           ADL either performed or assessed with clinical judgement   ADL Overall ADL's : Needs assistance/impaired     Grooming: Set up;Sitting;Wash/dry hands;Wash/dry face;Oral care;Brushing hair Grooming Details (indicate cue type and reason): increased RUE ROM with combing hair             Lower Body Dressing: Moderate assistance;Sit to/from stand   Toilet Transfer: Minimal assistance;Ambulation;Rolling walker (2 wheels) Toilet Transfer Details (indicate cue type and reason): simulated to recliner         Functional mobility during ADLs: Minimal assistance;Rolling walker (2 wheels)      Extremity/Trunk Assessment              Vision       Perception     Praxis      Cognition Arousal/Alertness: Awake/alert Behavior During Therapy: WFL for tasks assessed/performed Overall Cognitive Status: Within Functional Limits for tasks assessed  Exercises      Shoulder Instructions       General Comments VSS, nauseated with movement- RN notified    Pertinent Vitals/ Pain       Pain Assessment Pain Assessment: Faces Faces Pain Scale: Hurts whole lot Pain Location: R side of back at ribs Pain Descriptors / Indicators: Sharp, Moaning, Grimacing, Guarding Pain Intervention(s): Limited activity within patient's tolerance, Monitored during session, Repositioned, Ice applied  Home Living                                           Prior Functioning/Environment              Frequency  Min 2X/week        Progress Toward Goals  OT Goals(current goals can now be found in the care plan section)  Progress towards OT goals: Progressing toward goals  Acute Rehab OT Goals Patient Stated Goal: less pain OT Goal Formulation: With patient Time For Goal Achievement: 09/06/21 Potential to Achieve Goals: Good  Plan Discharge plan needs to be updated;Frequency remains appropriate    Co-evaluation                 AM-PAC OT "6 Clicks" Daily Activity     Outcome Measure   Help from another person eating meals?: A Little Help from another person taking care of personal grooming?: A Little Help from another person toileting, which includes using toliet, bedpan, or urinal?: A Little Help from another person bathing (including washing, rinsing, drying)?: A Lot Help from another person to put on and taking off regular upper body clothing?: A Little Help from another person to put on and taking off regular lower body clothing?: A Lot 6 Click Score: 16    End of Session Equipment Utilized During Treatment: Rolling walker (2 wheels)  OT Visit Diagnosis: Other abnormalities of gait and mobility (R26.89);Pain;Muscle weakness (generalized) (M62.81)   Activity Tolerance Patient limited by pain;Other (comment) (nausea)   Patient Left in chair;with call bell/phone within reach;with chair alarm set   Nurse Communication Mobility status;Other (comment) (nausea, needing bath)        Time: 7622-6333 OT Time Calculation (min): 26 min  Charges: OT General Charges $OT Visit: 1 Visit OT Treatments $Self Care/Home Management : 23-37 mins  Barry Brunner, OT Acute Rehabilitation Services Office [phone]   Chancy Milroy 08/24/2021, 11:26 AM

## 2021-08-24 NOTE — Progress Notes (Signed)
Patient ID: Vicki Hernandez, female   DOB: 1928/08/10, 88 y.o.   MRN: [id number] [facility] Surgery Progress Note     Subjective: CC-  Very comfortable this morning. Having a lot of pain in the right ribs. Denies SOB but has pain with deep inspiration. Pulling 600 on IS. Also complaining of some RUQ abdominal pain. She vomited once yesterday. No appetite this morning. Passing flatus, no BM.  Also has some vague left breast/chest pain. States that she did not hit this when she fell PTA. The pain does not radiate, and nothing in particular makes it better or worse.  Objective: Vital signs in last 24 hours: Temp:  [97.5 F (36.4 C)-98.7 F (37.1 C)] 98.7 F (37.1 C) (08/08 0650) Pulse Rate:  [83-110] 110 (08/08 0650) Resp:  [16-18] 18 (08/08 0650) BP: (174-197)/(72-108) 180/108 (08/08 0650) SpO2:  [95 %-96 %] 96 % (08/08 0650) Last BM Date : 08/22/21  Intake/Output from previous day: 08/07 0701 - 08/08 0700 In: 240 [P.O.:240] Out: 1900 [Urine:1900] Intake/Output this shift: No intake/output data recorded.  PE: Gen:  Alert, NAD, pleasant HEENT: EOM's intact, pupils equal and round Card:  tachycardic HR 110s Pulm:  CTAB, no W/R/R, rate and effort normal on room air, pulling 600 on IS Abd: Soft, protuberant, mild RUQ TTP, no peritonitis, few BS heard Psych: A&Ox4  Skin: no rashes noted, warm and dry  Lab Results:  Recent Labs    08/22/21 2250 08/23/21 0540  WBC 8.0 7.2  HGB 11.4* 12.4  HCT 36.0 38.7  PLT 215 226   BMET Recent Labs    08/22/21 2250 08/23/21 0540  NA 143 139  K 4.3 4.0  CL 109 104  CO2 28 29  GLUCOSE 127* 119*  BUN 21 18  CREATININE 1.12* 1.03*  CALCIUM 9.1 9.2   PT/INR No results for input(s): "LABPROT", "INR" in the last 72 hours. CMP     Component Value Date/Time   NA 139 08/23/2021 0540   NA 139 03/18/2020 1003   K 4.0 08/23/2021 0540   CL 104 08/23/2021 0540   CO2 29 08/23/2021 0540   GLUCOSE 119 (H) 08/23/2021 0540   BUN 18  08/23/2021 0540   BUN 14 03/18/2020 1003   CREATININE 1.03 (H) 08/23/2021 0540   CALCIUM 9.2 08/23/2021 0540   PROT 6.6 08/22/2021 2250   ALBUMIN 3.1 (L) 08/22/2021 2250   AST 17 08/22/2021 2250   ALT 10 08/22/2021 2250   ALKPHOS 47 08/22/2021 2250   BILITOT 0.3 08/22/2021 2250   GFRNONAA 51 (L) 08/23/2021 0540   GFRAA 51 (L) 05/29/2016 1222   Lipase  No results found for: "LIPASE"     Studies/Results: DG Abd 2 Views  Result Date: 08/23/2021 CLINICAL DATA:  Multiple rib fractures EXAM: ABDOMEN - 2 VIEW COMPARISON:  Radiograph 08/22/2021 FINDINGS: There is gaseous distension of bowel in the lower abdomen in a nonobstructive pattern. No evidence of free intraperitoneal gas. There are minimally displaced fractures of the right lateral seventh and eighth ribs, and possible the ninth rib. IMPRESSION: Gaseous distention of bowel in the lower abdomen in a nonobstructive pattern. Minimally displaced fracture of the right lateral seventh and eighth ribs and possibly the right ninth rib. Electronically Signed   By: Caprice Renshaw M.D.   On: 08/23/2021 13:27   DG Ribs Unilateral W/Chest Right  Result Date: 08/22/2021 CLINICAL DATA:  Right rib pain EXAM: RIGHT RIBS AND CHEST - 3+ VIEW COMPARISON:  None Available. FINDINGS: There is  an acute minimally displaced fracture of the right 7 and 8 ribs laterally. Lungs are clear. No pneumothorax or pleural effusion. Cardiac size within normal limits. Pulmonary vascularity is normal. IMPRESSION: Acute minimally displaced fractures of the right seventh and eighth ribs laterally. No pneumothorax. Electronically Signed   By: Helyn Numbers M.D.   On: 08/22/2021 22:11    Anti-infectives: Anti-infectives (From admission, onward)    None        Assessment/Plan  Fall against counter from standing Right rib fxs 7-8 - follow up xray stable without PNX. Multimodal pain control and pulm toilet.  RUQ abdominal pain - did not have a CT on admission, will obtain one  today.  ID - none FEN - HH diet VTE - sq heparin Foley - none  Dispo - Labs and CT scan as above. Vague left sided chest pain and she is tachycardic, will check EKG. Add scheduled robaxin and change tramadol to oxy 2.5-5mg . Continue therapies - recommending SNF  COPD HTN CKD-IIIa GERD  I reviewed last 24 h vitals and pain scores, last 48 h intake and output, last 24 h labs and trends, and last 24 h imaging results    LOS: 0 days    Franne Forts, [facility] Surgery 08/24/2021, 8:52 AM Please see Amion for pager number during day hours 7:00am-4:30pm

## 2021-08-25 ENCOUNTER — Inpatient Hospital Stay (HOSPITAL_COMMUNITY): Payer: Medicare Other

## 2021-08-25 DIAGNOSIS — R079 Chest pain, unspecified: Secondary | ICD-10-CM | POA: Diagnosis not present

## 2021-08-25 DIAGNOSIS — S2241XA Multiple fractures of ribs, right side, initial encounter for closed fracture: Secondary | ICD-10-CM | POA: Diagnosis not present

## 2021-08-25 DIAGNOSIS — R072 Precordial pain: Secondary | ICD-10-CM | POA: Diagnosis not present

## 2021-08-25 LAB — CBC
HCT: 46 % (ref 36.0–46.0)
Hemoglobin: 14.8 g/dL (ref 12.0–15.0)
MCH: 28.8 pg (ref 26.0–34.0)
MCHC: 32.2 g/dL (ref 30.0–36.0)
MCV: 89.7 fL (ref 80.0–100.0)
Platelets: 310 10*3/uL (ref 150–400)
RBC: 5.13 MIL/uL — ABNORMAL HIGH (ref 3.87–5.11)
RDW: 15.2 % (ref 11.5–15.5)
WBC: 14.2 10*3/uL — ABNORMAL HIGH (ref 4.0–10.5)
nRBC: 0 % (ref 0.0–0.2)

## 2021-08-25 LAB — ECHOCARDIOGRAM COMPLETE
AR max vel: 1.42 cm2
AV Area VTI: 1.32 cm2
AV Area mean vel: 1.28 cm2
AV Mean grad: 9 mmHg
AV Peak grad: 15.5 mmHg
Ao pk vel: 1.97 m/s
Height: 60 in
S' Lateral: 2 cm
Weight: 1888 oz

## 2021-08-25 LAB — HEPARIN LEVEL (UNFRACTIONATED): Heparin Unfractionated: 0.31 IU/mL (ref 0.30–0.70)

## 2021-08-25 MED ORDER — LIDOCAINE 5 % EX PTCH
2.0000 | MEDICATED_PATCH | Freq: Every day | CUTANEOUS | Status: DC
  Administered 2021-08-25: 1 via TRANSDERMAL
  Administered 2021-08-26 – 2021-08-27 (×2): 2 via TRANSDERMAL
  Filled 2021-08-25 (×4): qty 2

## 2021-08-25 MED ORDER — ALBUTEROL SULFATE (2.5 MG/3ML) 0.083% IN NEBU: 2.5000 mg | INHALATION_SOLUTION | RESPIRATORY_TRACT | Status: DC | PRN

## 2021-08-25 MED ORDER — BUDESON-GLYCOPYRROL-FORMOTEROL 160-9-4.8 MCG/ACT IN AERO: 2.0000 | INHALATION_SPRAY | Freq: Every day | RESPIRATORY_TRACT | Status: DC | PRN

## 2021-08-25 MED ORDER — METOPROLOL TARTRATE 12.5 MG HALF TABLET
12.5000 mg | ORAL_TABLET | Freq: Two times a day (BID) | ORAL | Status: DC
  Administered 2021-08-25 – 2021-08-26 (×3): 12.5 mg via ORAL
  Filled 2021-08-25 (×3): qty 1

## 2021-08-25 NOTE — Progress Notes (Signed)
, PROGRESS NOTE    Vicki Hernandez  TGG:[id number] DOB: [birth year] DOA: 08/22/2021 PCP: Daisy Floro, MD   Brief Narrative:  88 year old female history of COPD, hypertension, CKD stage IIIa, reflux presents to the ER today after a fall at home.  Patient was try to take the garbage bag out of the garbage can.  She lost her balance.  She struck the right side of her rib cage against the edge of the counter.  She had immediate pain. Rib imaging demonstrates acute fracture of the right seventh and eighth ribs.  No pneumothorax.   -would like to be a DNR  Subjective: Still with some left sided pain  Assessment & Plan:   Principal Problem:   Multiple fractures of ribs, right side, initial encounter for closed fracture Active Problems:   Multiple rib fractures - right 7-8   Essential hypertension, benign   Chronic obstructive pulmonary disease, unspecified (HCC)   Fall  Chest pain - New onset left-sided chest pain -Cardiology consult- echo pending -Sublingual nitroglycerin as needed.   Multiple rib fractures - right 3-11  Trauma service saw in consultation. Continue with pain control. Family requested SNF/STR   Fall Pt fell over trying to remove garbage bag from garbage can. Did not fall to floor but right her right rib cage against counter edge. PT OT are recommending SNF.   Chronic obstructive pulmonary disease, unspecified (HCC) Stable. Not exacerbated.   Essential hypertension, benign Continue home po htn meds.     DVT prophylaxis: SQ Heparin Code Status: DNR    Family Communication:   Disposition Plan:   SNF in AM Consults called: Trauma surgery, cardiology    Procedures:  ECHOCARDIOGRAM COMPLETE  Result Date: 08/25/2021    ECHOCARDIOGRAM REPORT   Patient Name:   Vicki Hernandez Date of Exam: 08/25/2021 Medical Rec #:  [id number]          Height:       60.0 in Accession #:    [id number]         Weight:       118.0 lb Date of Birth:  Feb 01, 1928            BSA:          1.492 m Patient Age:    89 years           BP:           106/49 mmHg Patient Gender: F                  HR:           110 bpm. Exam Location:  Inpatient Procedure: 2D Echo, Cardiac Doppler and Color Doppler Indications:    Chest pain  History:        Patient has no prior history of Echocardiogram examinations.                 Arrythmias:Atrial Fibrillation and SVT; Risk                 Factors:Hypertension and Dyslipidemia. PVD.  Sonographer:    Aron Baba Sonographer#2:  Elbert Ewings Referring Phys: Cyndi Bender IMPRESSIONS  1. Left ventricular ejection fraction, by estimation, is 60 to 65%. The left ventricle has normal function. The left ventricle has no regional wall motion abnormalities. There is mild concentric left ventricular hypertrophy. Left ventricular diastolic parameters are indeterminate.  2. Right ventricular systolic function is normal. The right ventricular size is normal.  3. The  mitral valve is normal in structure. No evidence of mitral valve regurgitation. No evidence of mitral stenosis.  4. The aortic valve is tricuspid. There is mild calcification of the aortic valve. There is mild thickening of the aortic valve. Aortic valve regurgitation is not visualized. Mild aortic valve stenosis.  5. The inferior vena cava is normal in size with greater than 50% respiratory variability, suggesting right atrial pressure of 3 mmHg. FINDINGS  Left Ventricle: Left ventricular ejection fraction, by estimation, is 60 to 65%. The left ventricle has normal function. The left ventricle has no regional wall motion abnormalities. The left ventricular internal cavity size was normal in size. There is  mild concentric left ventricular hypertrophy. Left ventricular diastolic parameters are indeterminate. Right Ventricle: The right ventricular size is normal. No increase in right ventricular wall thickness. Right ventricular systolic function is normal. Left Atrium: Left atrial size was normal in size.  Right Atrium: Right atrial size was normal in size. Pericardium: There is no evidence of pericardial effusion. Presence of epicardial fat layer. Mitral Valve: The mitral valve is normal in structure. Mild mitral annular calcification. No evidence of mitral valve regurgitation. No evidence of mitral valve stenosis. Tricuspid Valve: The tricuspid valve is normal in structure. Tricuspid valve regurgitation is not demonstrated. No evidence of tricuspid stenosis. Aortic Valve: The aortic valve is tricuspid. There is mild calcification of the aortic valve. There is mild thickening of the aortic valve. There is mild aortic valve annular calcification. Aortic valve regurgitation is not visualized. Mild aortic stenosis is present. Aortic valve mean gradient measures 9.0 mmHg. Aortic valve peak gradient measures 15.5 mmHg. Aortic valve area, by VTI measures 1.32 cm. Pulmonic Valve: The pulmonic valve was normal in structure. Pulmonic valve regurgitation is not visualized. No evidence of pulmonic stenosis. Aorta: The aortic root is normal in size and structure. Venous: The inferior vena cava is normal in size with greater than 50% respiratory variability, suggesting right atrial pressure of 3 mmHg. IAS/Shunts: No atrial level shunt detected by color flow Doppler.  LEFT VENTRICLE PLAX 2D LVIDd:         2.90 cm LVIDs:         2.00 cm LV PW:         1.20 cm LV IVS:        1.30 cm LVOT diam:     1.90 cm LV SV:         37 LV SV Index:   25 LVOT Area:     2.84 cm  RIGHT VENTRICLE             IVC RV S prime:     15.60 cm/s  IVC diam: 0.70 cm TAPSE (M-mode): 2.4 cm LEFT ATRIUM             Index        RIGHT ATRIUM           Index LA diam:        2.00 cm 1.34 cm/m   RA Area:     10.20 cm LA Vol (A2C):   23.0 ml 15.42 ml/m  RA Volume:   24.10 ml  16.16 ml/m LA Vol (A4C):   14.0 ml 9.39 ml/m LA Biplane Vol: 18.5 ml 12.40 ml/m  AORTIC VALVE AV Area (Vmax):    1.42 cm AV Area (Vmean):   1.28 cm AV Area (VTI):     1.32 cm AV  Vmax:           197.00  cm/s AV Vmean:          136.000 cm/s AV VTI:            0.281 m AV Peak Grad:      15.5 mmHg AV Mean Grad:      9.0 mmHg LVOT Vmax:         98.50 cm/s LVOT Vmean:        61.600 cm/s LVOT VTI:          0.131 m LVOT/AV VTI ratio: 0.47  AORTA Ao Root diam: 2.60 cm Ao Asc diam:  3.00 cm  SHUNTS Systemic VTI:  0.13 m Systemic Diam: 1.90 cm Kardie Tobb DO Electronically signed by Berniece Salines DO Signature Date/Time: 08/25/2021/12:33:20 PM    Final    DG CHEST PORT 1 VIEW  Result Date: 08/25/2021 CLINICAL DATA:  Acute left-sided chest pain. EXAM: PORTABLE CHEST 1 VIEW COMPARISON:  08/22/2021 FINDINGS: The heart size and mediastinal contours are within normal limits. Aortic atherosclerotic calcification incidentally noted. Both lungs are clear. No evidence of pneumothorax or pleural effusion. Several mildly displaced right lateral rib fractures are again noted. IMPRESSION: No active cardiopulmonary disease. Several mildly displaced right lateral rib fractures again noted. Electronically Signed   By: Marlaine Hind M.D.   On: 08/25/2021 10:39   CT CHEST ABDOMEN PELVIS W CONTRAST  Result Date: 08/24/2021 CLINICAL DATA:  88 year old female status post recent fall with right side rib fractures. EXAM: CT CHEST, ABDOMEN, AND PELVIS WITH CONTRAST TECHNIQUE: Multidetector CT imaging of the chest, abdomen and pelvis was performed following the standard protocol during bolus administration of intravenous contrast. RADIATION DOSE REDUCTION: This exam was performed according to the departmental dose-optimization program which includes automated exposure control, adjustment of the mA and/or kV according to patient size and/or use of iterative reconstruction technique. CONTRAST:  33mL OMNIPAQUE IOHEXOL 300 MG/ML  SOLN COMPARISON:  Right rib series 08/22/2021. FINDINGS: CT CHEST FINDINGS Cardiovascular: Calcified coronary artery atherosclerosis. Calcified aortic atherosclerosis. Mildly tortuous thoracic aorta. No  cardiomegaly or pericardial effusion. Major mediastinal vascular structures appear intact. Mediastinum/Nodes: Negative. No mediastinal hematoma, lymphadenopathy, mass. Lungs/Pleura: Trace layering right pleural effusion. No pneumothorax. Major airways are patent. Mild perihilar bronchiectasis. Mild bilateral lung scarring at the lung apices and lung bases. No consolidation or convincing pulmonary contusion. Musculoskeletal: Osteopenia. Lateral and posterior right 7th through 11th rib fractures. Seventh 8th and 9th posterolateral fractures are all mildly displaced. Superimposed nondisplaced fracture of the anterior right 7th rib on series 4, image 131. And there could also be nondisplaced right 3rd through 6th anterior rib fractures. No sternal fracture. Visible shoulder osseous structures appear intact. Age indeterminate mild T7 compression fracture. Minor age indeterminate compression of the T4 superior endplate. CT ABDOMEN PELVIS FINDINGS Hepatobiliary: No discrete liver injury. But there is a small volume of free fluid in the gallbladder fossa (coronal image 44). Gallbladder is mildly distended the wall thickness remains normal and there is no other pericholecystic inflammation. Bile ducts are within normal limits for age. Pancreas: Negative except for some atrophy. Spleen: No splenic injury or perisplenic fluid. Adrenals/Urinary Tract: Adrenal glands and kidneys are within normal limits for age. Several small benign appearing renal cysts (no follow-up imaging recommended). Unremarkable urinary bladder. Stomach/Bowel: Decompressed rectum. Redundant gas distended sigmoid colon. But decompressed upstream sigmoid and descending colon. Small volume of simple density free fluid in the left gutter on series 3, image 72. And there is mild to moderate regional descending colon bowel wall thickening (series 3, image 81)  which is fairly circumferential but along a short segment of only 4-5 cm. Upstream colon is nondilated  although there is abundant retained stool in redundant transverse colon. Negative right colon. No right gutter fluid. No pneumoperitoneum identified. No dilated small bowel. Small volume fluid in the stomach. Duodenum is decompressed. Vascular/Lymphatic: Infrarenal abdominal aortic aneurysm measures up to 4.4 cm diameter with abundant mural plaque or thrombus (series 3, image 74). Underlying Calcified aortic atherosclerosis. Major arterial structures including the IMA remain patent. Iliofemoral calcified atherosclerosis. Portal venous system appears to be patent. No lymphadenopathy. Reproductive: Surgically absent uterus. Diminutive or absent ovaries. Other: Scattered small volume of free fluid layering in the pelvis (series 3, image 103). Musculoskeletal: Osteopenia. Lumbar vertebrae appear intact with mild degenerative spondylolisthesis L3-L4 and L4-L5. Sacrum, SI joints, pelvis and proximal femurs appear intact. Trace ventral right abdominal wall soft tissue gas appears related to subcutaneous injection on series 3, image 90. And there is similar trace gas within the atrophied left abdominal wall muscle on image 91. IMPRESSION: 1. Osteopenia with definite post-traumatic fractures of right ribs 7 through 11 (7th rib fractured in two places). Possible nondisplaced right 3rd through 6th rib fractures also. Trace right pleural effusion. No pneumothorax or pulmonary contusion. 2. Abnormal but nonspecific free fluid in the left gutter and pelvis. Bowel Injury is not excluded, and there is abnormal mural thickening of the descending colon in a segment of 4-5 cm. Differential considerations include Adenocarcinoma and focal Colitis. 3. No other acute traumatic injury identified; age indeterminate mild T4 and T7 compression fractures. 4. 4.4 cm infrarenal abdominal aortic aneurysm. Recommend follow-up every 12 months and Vascular Consultation. Aortic aneurysm NOS (ICD10-I71.9). Aortic Atherosclerosis (ICD10-I70.0).  Reference: J Am Coll Radiol [id number]. Electronically Signed   By: Genevie Ann M.D.   On: 08/24/2021 09:28   DG Abd 2 Views  Result Date: 08/23/2021 CLINICAL DATA:  Multiple rib fractures EXAM: ABDOMEN - 2 VIEW COMPARISON:  Radiograph 08/22/2021 FINDINGS: There is gaseous distension of bowel in the lower abdomen in a nonobstructive pattern. No evidence of free intraperitoneal gas. There are minimally displaced fractures of the right lateral seventh and eighth ribs, and possible the ninth rib. IMPRESSION: Gaseous distention of bowel in the lower abdomen in a nonobstructive pattern. Minimally displaced fracture of the right lateral seventh and eighth ribs and possibly the right ninth rib. Electronically Signed   By: Maurine Simmering M.D.   On: 08/23/2021 13:27   DG Ribs Unilateral W/Chest Right  Result Date: 08/22/2021 CLINICAL DATA:  Right rib pain EXAM: RIGHT RIBS AND CHEST - 3+ VIEW COMPARISON:  None Available. FINDINGS: There is an acute minimally displaced fracture of the right 7 and 8 ribs laterally. Lungs are clear. No pneumothorax or pleural effusion. Cardiac size within normal limits. Pulmonary vascularity is normal. IMPRESSION: Acute minimally displaced fractures of the right seventh and eighth ribs laterally. No pneumothorax. Electronically Signed   By: Fidela Salisbury M.D.   On: 08/22/2021 22:11     Objective: Vitals:   08/24/21 2200 08/25/21 0539 08/25/21 0749 08/25/21 1127  BP: (!) 143/72 (!) 159/85 (!) 141/68 119/68  Pulse: (!) 110 (!) 117 (!) 115 (!) 116  Resp: 18 18 16 16   Temp: 97.7 F (36.5 C) 97.7 F (36.5 C) 98 F (36.7 C) 98.5 F (36.9 C)  TempSrc: Oral Oral Oral Oral  SpO2: 96% 93% 97% 97%  Weight:      Height:        Intake/Output Summary (Last 24 hours)  at 08/25/2021 1322 Last data filed at 08/25/2021 0300 Gross per 24 hour  Intake 264.39 ml  Output --  Net 264.39 ml   Filed Weights   08/22/21 2050  Weight: 53.5 kg    Examination:  General: Appearance:     elderly female in no acute distress     Lungs:     Clear to auscultation bilaterally, respirations unlabored  Heart:    Tachycardic.   MS:   All extremities are intact.   Neurologic:   Awake, alert      Data Reviewed: I have personally reviewed following labs and imaging studies  CBC: Recent Labs  Lab 08/22/21 2250 08/23/21 0540 08/24/21 0823 08/25/21 0646  WBC 8.0 7.2 11.5* 14.2*  NEUTROABS 5.7  --   --   --   HGB 11.4* 12.4 15.0 14.8  HCT 36.0 38.7 45.8 46.0  MCV 94.2 91.5 88.6 89.7  PLT 215 226 297 [id number]   Basic Metabolic Panel: Recent Labs  Lab 08/22/21 2250 08/23/21 0540 08/24/21 0823  NA 143 139 134*  K 4.3 4.0 3.9  CL 109 104 97*  CO2 28 29 23   GLUCOSE 127* 119* 130*  BUN 21 18 22   CREATININE 1.12* 1.03* 1.05*  CALCIUM 9.1 9.2 9.7   GFR: Estimated Creatinine Clearance: 25.1 mL/min (A) (by C-G formula based on SCr of 1.05 mg/dL (H)). Liver Function Tests: Recent Labs  Lab 08/22/21 2250 08/24/21 0823  AST 17 21  ALT 10 12  ALKPHOS 47 57  BILITOT 0.3 0.9  PROT 6.6 7.4  ALBUMIN 3.1* 3.5   No results for input(s): "LIPASE", "AMYLASE" in the last 168 hours. No results for input(s): "AMMONIA" in the last 168 hours. Coagulation Profile: No results for input(s): "INR", "PROTIME" in the last 168 hours. Cardiac Enzymes: No results for input(s): "CKTOTAL", "CKMB", "CKMBINDEX", "TROPONINI" in the last 168 hours. BNP (last 3 results) No results for input(s): "PROBNP" in the last 8760 hours. HbA1C: No results for input(s): "HGBA1C" in the last 72 hours. CBG: No results for input(s): "GLUCAP" in the last 168 hours. Lipid Profile: No results for input(s): "CHOL", "HDL", "LDLCALC", "TRIG", "CHOLHDL", "LDLDIRECT" in the last 72 hours. Thyroid Function Tests: No results for input(s): "TSH", "T4TOTAL", "FREET4", "T3FREE", "THYROIDAB" in the last 72 hours. Anemia Panel: No results for input(s): "VITAMINB12", "FOLATE", "FERRITIN", "TIBC", "IRON", "RETICCTPCT" in  the last 72 hours. Sepsis Labs: No results for input(s): "PROCALCITON", "LATICACIDVEN" in the last 168 hours.  No results found for this or any previous visit (from the past 240 hour(s)).       Radiology Studies: ECHOCARDIOGRAM COMPLETE  Result Date: 08/25/2021    ECHOCARDIOGRAM REPORT   Patient Name:   Vicki Hernandez Date of Exam: 08/25/2021 Medical Rec #:  [id number]          Height:       60.0 in Accession #:    [id number]         Weight:       118.0 lb Date of Birth:  06/27/1928           BSA:          1.492 m Patient Age:    8 years           BP:           106/49 mmHg Patient Gender: F                  HR:  110 bpm. Exam Location:  Inpatient Procedure: 2D Echo, Cardiac Doppler and Color Doppler Indications:    Chest pain  History:        Patient has no prior history of Echocardiogram examinations.                 Arrythmias:Atrial Fibrillation and SVT; Risk                 Factors:Hypertension and Dyslipidemia. PVD.  Sonographer:    Greer Pickerel Sonographer#2:  Vaughan Basta Referring Phys: Margie Billet IMPRESSIONS  1. Left ventricular ejection fraction, by estimation, is 60 to 65%. The left ventricle has normal function. The left ventricle has no regional wall motion abnormalities. There is mild concentric left ventricular hypertrophy. Left ventricular diastolic parameters are indeterminate.  2. Right ventricular systolic function is normal. The right ventricular size is normal.  3. The mitral valve is normal in structure. No evidence of mitral valve regurgitation. No evidence of mitral stenosis.  4. The aortic valve is tricuspid. There is mild calcification of the aortic valve. There is mild thickening of the aortic valve. Aortic valve regurgitation is not visualized. Mild aortic valve stenosis.  5. The inferior vena cava is normal in size with greater than 50% respiratory variability, suggesting right atrial pressure of 3 mmHg. FINDINGS  Left Ventricle: Left ventricular ejection fraction,  by estimation, is 60 to 65%. The left ventricle has normal function. The left ventricle has no regional wall motion abnormalities. The left ventricular internal cavity size was normal in size. There is  mild concentric left ventricular hypertrophy. Left ventricular diastolic parameters are indeterminate. Right Ventricle: The right ventricular size is normal. No increase in right ventricular wall thickness. Right ventricular systolic function is normal. Left Atrium: Left atrial size was normal in size. Right Atrium: Right atrial size was normal in size. Pericardium: There is no evidence of pericardial effusion. Presence of epicardial fat layer. Mitral Valve: The mitral valve is normal in structure. Mild mitral annular calcification. No evidence of mitral valve regurgitation. No evidence of mitral valve stenosis. Tricuspid Valve: The tricuspid valve is normal in structure. Tricuspid valve regurgitation is not demonstrated. No evidence of tricuspid stenosis. Aortic Valve: The aortic valve is tricuspid. There is mild calcification of the aortic valve. There is mild thickening of the aortic valve. There is mild aortic valve annular calcification. Aortic valve regurgitation is not visualized. Mild aortic stenosis is present. Aortic valve mean gradient measures 9.0 mmHg. Aortic valve peak gradient measures 15.5 mmHg. Aortic valve area, by VTI measures 1.32 cm. Pulmonic Valve: The pulmonic valve was normal in structure. Pulmonic valve regurgitation is not visualized. No evidence of pulmonic stenosis. Aorta: The aortic root is normal in size and structure. Venous: The inferior vena cava is normal in size with greater than 50% respiratory variability, suggesting right atrial pressure of 3 mmHg. IAS/Shunts: No atrial level shunt detected by color flow Doppler.  LEFT VENTRICLE PLAX 2D LVIDd:         2.90 cm LVIDs:         2.00 cm LV PW:         1.20 cm LV IVS:        1.30 cm LVOT diam:     1.90 cm LV SV:         37 LV SV  Index:   25 LVOT Area:     2.84 cm  RIGHT VENTRICLE             IVC  RV S prime:     15.60 cm/s  IVC diam: 0.70 cm TAPSE (M-mode): 2.4 cm LEFT ATRIUM             Index        RIGHT ATRIUM           Index LA diam:        2.00 cm 1.34 cm/m   RA Area:     10.20 cm LA Vol (A2C):   23.0 ml 15.42 ml/m  RA Volume:   24.10 ml  16.16 ml/m LA Vol (A4C):   14.0 ml 9.39 ml/m LA Biplane Vol: 18.5 ml 12.40 ml/m  AORTIC VALVE AV Area (Vmax):    1.42 cm AV Area (Vmean):   1.28 cm AV Area (VTI):     1.32 cm AV Vmax:           197.00 cm/s AV Vmean:          136.000 cm/s AV VTI:            0.281 m AV Peak Grad:      15.5 mmHg AV Mean Grad:      9.0 mmHg LVOT Vmax:         98.50 cm/s LVOT Vmean:        61.600 cm/s LVOT VTI:          0.131 m LVOT/AV VTI ratio: 0.47  AORTA Ao Root diam: 2.60 cm Ao Asc diam:  3.00 cm  SHUNTS Systemic VTI:  0.13 m Systemic Diam: 1.90 cm Kardie Tobb DO Electronically signed by Berniece Salines DO Signature Date/Time: 08/25/2021/12:33:20 PM    Final    DG CHEST PORT 1 VIEW  Result Date: 08/25/2021 CLINICAL DATA:  Acute left-sided chest pain. EXAM: PORTABLE CHEST 1 VIEW COMPARISON:  08/22/2021 FINDINGS: The heart size and mediastinal contours are within normal limits. Aortic atherosclerotic calcification incidentally noted. Both lungs are clear. No evidence of pneumothorax or pleural effusion. Several mildly displaced right lateral rib fractures are again noted. IMPRESSION: No active cardiopulmonary disease. Several mildly displaced right lateral rib fractures again noted. Electronically Signed   By: Marlaine Hind M.D.   On: 08/25/2021 10:39   CT CHEST ABDOMEN PELVIS W CONTRAST  Result Date: 08/24/2021 CLINICAL DATA:  88 year old female status post recent fall with right side rib fractures. EXAM: CT CHEST, ABDOMEN, AND PELVIS WITH CONTRAST TECHNIQUE: Multidetector CT imaging of the chest, abdomen and pelvis was performed following the standard protocol during bolus administration of intravenous  contrast. RADIATION DOSE REDUCTION: This exam was performed according to the departmental dose-optimization program which includes automated exposure control, adjustment of the mA and/or kV according to patient size and/or use of iterative reconstruction technique. CONTRAST:  5mL OMNIPAQUE IOHEXOL 300 MG/ML  SOLN COMPARISON:  Right rib series 08/22/2021. FINDINGS: CT CHEST FINDINGS Cardiovascular: Calcified coronary artery atherosclerosis. Calcified aortic atherosclerosis. Mildly tortuous thoracic aorta. No cardiomegaly or pericardial effusion. Major mediastinal vascular structures appear intact. Mediastinum/Nodes: Negative. No mediastinal hematoma, lymphadenopathy, mass. Lungs/Pleura: Trace layering right pleural effusion. No pneumothorax. Major airways are patent. Mild perihilar bronchiectasis. Mild bilateral lung scarring at the lung apices and lung bases. No consolidation or convincing pulmonary contusion. Musculoskeletal: Osteopenia. Lateral and posterior right 7th through 11th rib fractures. Seventh 8th and 9th posterolateral fractures are all mildly displaced. Superimposed nondisplaced fracture of the anterior right 7th rib on series 4, image 131. And there could also be nondisplaced right 3rd through 6th anterior rib fractures. No sternal fracture. Visible shoulder osseous structures  appear intact. Age indeterminate mild T7 compression fracture. Minor age indeterminate compression of the T4 superior endplate. CT ABDOMEN PELVIS FINDINGS Hepatobiliary: No discrete liver injury. But there is a small volume of free fluid in the gallbladder fossa (coronal image 44). Gallbladder is mildly distended the wall thickness remains normal and there is no other pericholecystic inflammation. Bile ducts are within normal limits for age. Pancreas: Negative except for some atrophy. Spleen: No splenic injury or perisplenic fluid. Adrenals/Urinary Tract: Adrenal glands and kidneys are within normal limits for age. Several  small benign appearing renal cysts (no follow-up imaging recommended). Unremarkable urinary bladder. Stomach/Bowel: Decompressed rectum. Redundant gas distended sigmoid colon. But decompressed upstream sigmoid and descending colon. Small volume of simple density free fluid in the left gutter on series 3, image 72. And there is mild to moderate regional descending colon bowel wall thickening (series 3, image 81) which is fairly circumferential but along a short segment of only 4-5 cm. Upstream colon is nondilated although there is abundant retained stool in redundant transverse colon. Negative right colon. No right gutter fluid. No pneumoperitoneum identified. No dilated small bowel. Small volume fluid in the stomach. Duodenum is decompressed. Vascular/Lymphatic: Infrarenal abdominal aortic aneurysm measures up to 4.4 cm diameter with abundant mural plaque or thrombus (series 3, image 74). Underlying Calcified aortic atherosclerosis. Major arterial structures including the IMA remain patent. Iliofemoral calcified atherosclerosis. Portal venous system appears to be patent. No lymphadenopathy. Reproductive: Surgically absent uterus. Diminutive or absent ovaries. Other: Scattered small volume of free fluid layering in the pelvis (series 3, image 103). Musculoskeletal: Osteopenia. Lumbar vertebrae appear intact with mild degenerative spondylolisthesis L3-L4 and L4-L5. Sacrum, SI joints, pelvis and proximal femurs appear intact. Trace ventral right abdominal wall soft tissue gas appears related to subcutaneous injection on series 3, image 90. And there is similar trace gas within the atrophied left abdominal wall muscle on image 91. IMPRESSION: 1. Osteopenia with definite post-traumatic fractures of right ribs 7 through 11 (7th rib fractured in two places). Possible nondisplaced right 3rd through 6th rib fractures also. Trace right pleural effusion. No pneumothorax or pulmonary contusion. 2. Abnormal but nonspecific free  fluid in the left gutter and pelvis. Bowel Injury is not excluded, and there is abnormal mural thickening of the descending colon in a segment of 4-5 cm. Differential considerations include Adenocarcinoma and focal Colitis. 3. No other acute traumatic injury identified; age indeterminate mild T4 and T7 compression fractures. 4. 4.4 cm infrarenal abdominal aortic aneurysm. Recommend follow-up every 12 months and Vascular Consultation. Aortic aneurysm NOS (ICD10-I71.9). Aortic Atherosclerosis (ICD10-I70.0). Reference: J Am Coll Radiol [id number]. Electronically Signed   By: Genevie Ann M.D.   On: 08/24/2021 09:28        Scheduled Meds:  acetaminophen  1,000 mg Oral Q6H   aspirin EC  81 mg Oral Daily   docusate sodium  100 mg Oral BID   fluticasone furoate-vilanterol  1 puff Inhalation Daily   And   umeclidinium bromide  1 puff Inhalation Daily   irbesartan  150 mg Oral Daily   lidocaine  2 patch Transdermal Daily   lip balm   Topical BID   methocarbamol  500 mg Oral Q6H   metoprolol tartrate  12.5 mg Oral BID   polyethylene glycol  17 g Oral BID   simvastatin  40 mg Oral QPM   Continuous Infusions:  albumin human     methocarbamol (ROBAXIN) IV     ondansetron (ZOFRAN) IV  LOS: 1 day    Time spent: 45  min  This record has been created using Systems analyst. Errors have been sought and corrected,but may not always be located. Such creation errors do not reflect on the standard of care.   Geradine Girt, DO [facility]  If 7PM-7AM, please contact night-coverage  08/25/2021, 1:22 PM

## 2021-08-25 NOTE — Progress Notes (Signed)
Physical Therapy Treatment Patient Details Name: Vicki Hernandez MRN: [id number] DOB: May 17, 1928 Today's Date: 08/25/2021   History of Present Illness Pt is 88 year old female history of COPD, hypertension, CKD stage IIIa, reflux presents to the ER today after a fall at home. Patient was try to take the garbage bag out of the garbage can. She lost her balance. She struck the right side of her rib cage against the edge of the counter.  She had immediate pain. Patient brought to ER by EMS.  Rib imaging demonstrates acute fracture of the right seventh and eighth ribs (CT showed 7-11 rib fractures on 8/8). No pneumothorax.    PT Comments    Pt instructed in and completed gait training. Pt able to progress to 25 feet with RW. Pt limited by pain, fatigue, and weakness. Pt up in chair to eat her lunch after session (required encouragement). Pt will continue to benefit from skilled, acute care physical therapy interventions to maximize her current level of function and progress towards established goals.   Recommendations for follow up therapy are one component of a multi-disciplinary discharge planning process, led by the attending physician.  Recommendations may be updated based on patient status, additional functional criteria and insurance authorization.  Follow Up Recommendations  Skilled nursing-short term rehab (<3 hours/day) Can patient physically be transported by private vehicle: Yes   Assistance Recommended at Discharge Frequent or constant Supervision/Assistance  Patient can return home with the following A little help with walking and/or transfers;A little help with bathing/dressing/bathroom;Assistance with cooking/housework;Assist for transportation   Equipment Recommendations  None recommended by PT    Recommendations for Other Services       Precautions / Restrictions Precautions Precautions: Fall Restrictions Weight Bearing Restrictions: No     Mobility  Bed Mobility                General bed mobility comments: OOB in recliner upon entry    Transfers Overall transfer level: Needs assistance Equipment used: Rolling walker (2 wheels) Transfers: Sit to/from Stand Sit to Stand: Min assist           General transfer comment: Pt able to perform hand placements correctly without cues.    Ambulation/Gait Ambulation/Gait assistance: Min guard Gait Distance (Feet): 25 Feet Assistive device: Rolling walker (2 wheels) (chair follow) Gait Pattern/deviations: Step-through pattern, Decreased step length - right, Decreased step length - left, Trunk flexed Gait velocity: decreased Gait velocity interpretation: <1.31 ft/sec, indicative of household ambulator   General Gait Details: Pt with minimal unsteadiness but no LOB. Chair followed pt but she did not require it. Pt required cues for pacing and she reported weakness in LE's.   Stairs             Wheelchair Mobility    Modified Rankin (Stroke Patients Only)       Balance Overall balance assessment: Needs assistance Sitting-balance support: Bilateral upper extremity supported Sitting balance-Leahy Scale: Fair     Standing balance support: Reliant on assistive device for balance Standing balance-Leahy Scale: Poor                              Cognition Arousal/Alertness: Awake/alert Behavior During Therapy: WFL for tasks assessed/performed Overall Cognitive Status: Within Functional Limits for tasks assessed  Exercises      General Comments General comments (skin integrity, edema, etc.): 94HR and 96% SpO2 on RA.      Pertinent Vitals/Pain Pain Assessment Pain Assessment: 0-10 Pain Score: 0-No pain (at rest; 8/10 with movement at times) Pain Location: R side of back at ribs Pain Descriptors / Indicators: Sharp, Moaning, Grimacing, Guarding Pain Intervention(s): Limited activity within patient's tolerance,  Monitored during session    Home Living                          Prior Function            PT Goals (current goals can now be found in the care plan section) Acute Rehab PT Goals Patient Stated Goal: none stated PT Goal Formulation: With patient Time For Goal Achievement: 08/30/21 Potential to Achieve Goals: Good Progress towards PT goals: Progressing toward goals    Frequency    Min 3X/week      PT Plan Current plan remains appropriate    Co-evaluation              AM-PAC PT "6 Clicks" Mobility   Outcome Measure  Help needed turning from your back to your side while in a flat bed without using bedrails?: A Little Help needed moving from lying on your back to sitting on the side of a flat bed without using bedrails?: A Little Help needed moving to and from a bed to a chair (including a wheelchair)?: A Little Help needed standing up from a chair using your arms (e.g., wheelchair or bedside chair)?: A Little Help needed to walk in hospital room?: A Little Help needed climbing 3-5 steps with a railing? : A Lot 6 Click Score: 17    End of Session Equipment Utilized During Treatment: Gait belt Activity Tolerance: Patient limited by pain Patient left: in chair;with call bell/phone within reach;with chair alarm set Nurse Communication: Mobility status PT Visit Diagnosis: Other abnormalities of gait and mobility (R26.89);Muscle weakness (generalized) (M62.81);Pain Pain - Right/Left: Right Pain - part of body:  (back/ribs)     Time: 1414-1430 PT Time Calculation (min) (ACUTE ONLY): 16 min  Charges:  $Gait Training: 8-22 mins                     Tana Coast, PT    [employer] 08/25/2021, 2:37 PM

## 2021-08-25 NOTE — Progress Notes (Signed)
Patient refused 8am ambulation due to pain in rib area. Will ask again in about an hour.

## 2021-08-25 NOTE — Progress Notes (Signed)
Patient ID: Vicki Hernandez, female   DOB: May 24, 1928, 88 y.o.   MRN: [id number] [facility] Surgery Progress Note     Subjective: CC-  Continues to have more left sided chest pain rather than right sided where rib fractures are located. She does still have some discomfort from the right ribs when she mobilizes. Denies SOB.  Objective: Vital signs in last 24 hours: Temp:  [97.7 F (36.5 C)-98.5 F (36.9 C)] 98.5 F (36.9 C) (08/09 1127) Pulse Rate:  [102-117] 116 (08/09 1127) Resp:  [16-18] 16 (08/09 1127) BP: (119-159)/(68-99) 119/68 (08/09 1127) SpO2:  [93 %-98 %] 97 % (08/09 1127) Last BM Date : 08/22/21  Intake/Output from previous day: 08/08 0701 - 08/09 0700 In: 384.4 [P.O.:280; I.V.:104.4] Out: -  Intake/Output this shift: No intake/output data recorded.  PE: Gen:  Alert, NAD, pleasant HEENT: EOM's intact, pupils equal and round Card:  tachycardic HR 110s Pulm:  CTAB, no W/R/R, rate and effort normal on room air, pulling 600 on IS Abd: Soft, protuberant, mild RUQ TTP, no peritonitis, few BS heard Psych: A&Ox4  Skin: no rashes noted, warm and dry   Lab Results:  Recent Labs    08/24/21 0823 08/25/21 0646  WBC 11.5* 14.2*  HGB 15.0 14.8  HCT 45.8 46.0  PLT 297 310   BMET Recent Labs    08/23/21 0540 08/24/21 0823  NA 139 134*  K 4.0 3.9  CL 104 97*  CO2 29 23  GLUCOSE 119* 130*  BUN 18 22  CREATININE 1.03* 1.05*  CALCIUM 9.2 9.7   PT/INR No results for input(s): "LABPROT", "INR" in the last 72 hours. CMP     Component Value Date/Time   NA 134 (L) 08/24/2021 0823   NA 139 03/18/2020 1003   K 3.9 08/24/2021 0823   CL 97 (L) 08/24/2021 0823   CO2 23 08/24/2021 0823   GLUCOSE 130 (H) 08/24/2021 0823   BUN 22 08/24/2021 0823   BUN 14 03/18/2020 1003   CREATININE 1.05 (H) 08/24/2021 0823   CALCIUM 9.7 08/24/2021 0823   PROT 7.4 08/24/2021 0823   ALBUMIN 3.5 08/24/2021 0823   AST 21 08/24/2021 0823   ALT 12 08/24/2021 0823   ALKPHOS  57 08/24/2021 0823   BILITOT 0.9 08/24/2021 0823   GFRNONAA 50 (L) 08/24/2021 0823   GFRAA 51 (L) 05/29/2016 1222   Lipase  No results found for: "LIPASE"     Studies/Results: ECHOCARDIOGRAM COMPLETE  Result Date: 08/25/2021    ECHOCARDIOGRAM REPORT   Patient Name:   Vicki Hernandez Date of Exam: 08/25/2021 Medical Rec #:  [id number]          Height:       60.0 in Accession #:    [id number]         Weight:       118.0 lb Date of Birth:  06-Feb-1928           BSA:          1.492 m Patient Age:    89 years           BP:           106/49 mmHg Patient Gender: F                  HR:           110 bpm. Exam Location:  Inpatient Procedure: 2D Echo, Cardiac Doppler and Color Doppler Indications:    Chest pain  History:  Patient has no prior history of Echocardiogram examinations.                 Arrythmias:Atrial Fibrillation and SVT; Risk                 Factors:Hypertension and Dyslipidemia. PVD.  Sonographer:    Aron BabaNorma Walker Sonographer#2:  Elbert Ewingshristy Hall Referring Phys: Cyndi BenderXIKA ZHAO IMPRESSIONS  1. Left ventricular ejection fraction, by estimation, is 60 to 65%. The left ventricle has normal function. The left ventricle has no regional wall motion abnormalities. There is mild concentric left ventricular hypertrophy. Left ventricular diastolic parameters are indeterminate.  2. Right ventricular systolic function is normal. The right ventricular size is normal.  3. The mitral valve is normal in structure. No evidence of mitral valve regurgitation. No evidence of mitral stenosis.  4. The aortic valve is tricuspid. There is mild calcification of the aortic valve. There is mild thickening of the aortic valve. Aortic valve regurgitation is not visualized. Mild aortic valve stenosis.  5. The inferior vena cava is normal in size with greater than 50% respiratory variability, suggesting right atrial pressure of 3 mmHg. FINDINGS  Left Ventricle: Left ventricular ejection fraction, by estimation, is 60 to 65%. The left  ventricle has normal function. The left ventricle has no regional wall motion abnormalities. The left ventricular internal cavity size was normal in size. There is  mild concentric left ventricular hypertrophy. Left ventricular diastolic parameters are indeterminate. Right Ventricle: The right ventricular size is normal. No increase in right ventricular wall thickness. Right ventricular systolic function is normal. Left Atrium: Left atrial size was normal in size. Right Atrium: Right atrial size was normal in size. Pericardium: There is no evidence of pericardial effusion. Presence of epicardial fat layer. Mitral Valve: The mitral valve is normal in structure. Mild mitral annular calcification. No evidence of mitral valve regurgitation. No evidence of mitral valve stenosis. Tricuspid Valve: The tricuspid valve is normal in structure. Tricuspid valve regurgitation is not demonstrated. No evidence of tricuspid stenosis. Aortic Valve: The aortic valve is tricuspid. There is mild calcification of the aortic valve. There is mild thickening of the aortic valve. There is mild aortic valve annular calcification. Aortic valve regurgitation is not visualized. Mild aortic stenosis is present. Aortic valve mean gradient measures 9.0 mmHg. Aortic valve peak gradient measures 15.5 mmHg. Aortic valve area, by VTI measures 1.32 cm. Pulmonic Valve: The pulmonic valve was normal in structure. Pulmonic valve regurgitation is not visualized. No evidence of pulmonic stenosis. Aorta: The aortic root is normal in size and structure. Venous: The inferior vena cava is normal in size with greater than 50% respiratory variability, suggesting right atrial pressure of 3 mmHg. IAS/Shunts: No atrial level shunt detected by color flow Doppler.  LEFT VENTRICLE PLAX 2D LVIDd:         2.90 cm LVIDs:         2.00 cm LV PW:         1.20 cm LV IVS:        1.30 cm LVOT diam:     1.90 cm LV SV:         37 LV SV Index:   25 LVOT Area:     2.84 cm  RIGHT  VENTRICLE             IVC RV S prime:     15.60 cm/s  IVC diam: 0.70 cm TAPSE (M-mode): 2.4 cm LEFT ATRIUM  Index        RIGHT ATRIUM           Index LA diam:        2.00 cm 1.34 cm/m   RA Area:     10.20 cm LA Vol (A2C):   23.0 ml 15.42 ml/m  RA Volume:   24.10 ml  16.16 ml/m LA Vol (A4C):   14.0 ml 9.39 ml/m LA Biplane Vol: 18.5 ml 12.40 ml/m  AORTIC VALVE AV Area (Vmax):    1.42 cm AV Area (Vmean):   1.28 cm AV Area (VTI):     1.32 cm AV Vmax:           197.00 cm/s AV Vmean:          136.000 cm/s AV VTI:            0.281 m AV Peak Grad:      15.5 mmHg AV Mean Grad:      9.0 mmHg LVOT Vmax:         98.50 cm/s LVOT Vmean:        61.600 cm/s LVOT VTI:          0.131 m LVOT/AV VTI ratio: 0.47  AORTA Ao Root diam: 2.60 cm Ao Asc diam:  3.00 cm  SHUNTS Systemic VTI:  0.13 m Systemic Diam: 1.90 cm Kardie Tobb DO Electronically signed by Thomasene Ripple DO Signature Date/Time: 08/25/2021/12:33:20 PM    Final    DG CHEST PORT 1 VIEW  Result Date: 08/25/2021 CLINICAL DATA:  Acute left-sided chest pain. EXAM: PORTABLE CHEST 1 VIEW COMPARISON:  08/22/2021 FINDINGS: The heart size and mediastinal contours are within normal limits. Aortic atherosclerotic calcification incidentally noted. Both lungs are clear. No evidence of pneumothorax or pleural effusion. Several mildly displaced right lateral rib fractures are again noted. IMPRESSION: No active cardiopulmonary disease. Several mildly displaced right lateral rib fractures again noted. Electronically Signed   By: Danae Orleans M.D.   On: 08/25/2021 10:39   CT CHEST ABDOMEN PELVIS W CONTRAST  Result Date: 08/24/2021 CLINICAL DATA:  88 year old female status post recent fall with right side rib fractures. EXAM: CT CHEST, ABDOMEN, AND PELVIS WITH CONTRAST TECHNIQUE: Multidetector CT imaging of the chest, abdomen and pelvis was performed following the standard protocol during bolus administration of intravenous contrast. RADIATION DOSE REDUCTION: This exam was  performed according to the departmental dose-optimization program which includes automated exposure control, adjustment of the mA and/or kV according to patient size and/or use of iterative reconstruction technique. CONTRAST:  24mL OMNIPAQUE IOHEXOL 300 MG/ML  SOLN COMPARISON:  Right rib series 08/22/2021. FINDINGS: CT CHEST FINDINGS Cardiovascular: Calcified coronary artery atherosclerosis. Calcified aortic atherosclerosis. Mildly tortuous thoracic aorta. No cardiomegaly or pericardial effusion. Major mediastinal vascular structures appear intact. Mediastinum/Nodes: Negative. No mediastinal hematoma, lymphadenopathy, mass. Lungs/Pleura: Trace layering right pleural effusion. No pneumothorax. Major airways are patent. Mild perihilar bronchiectasis. Mild bilateral lung scarring at the lung apices and lung bases. No consolidation or convincing pulmonary contusion. Musculoskeletal: Osteopenia. Lateral and posterior right 7th through 11th rib fractures. Seventh 8th and 9th posterolateral fractures are all mildly displaced. Superimposed nondisplaced fracture of the anterior right 7th rib on series 4, image 131. And there could also be nondisplaced right 3rd through 6th anterior rib fractures. No sternal fracture. Visible shoulder osseous structures appear intact. Age indeterminate mild T7 compression fracture. Minor age indeterminate compression of the T4 superior endplate. CT ABDOMEN PELVIS FINDINGS Hepatobiliary: No discrete liver injury. But there is a small volume  of free fluid in the gallbladder fossa (coronal image 44). Gallbladder is mildly distended the wall thickness remains normal and there is no other pericholecystic inflammation. Bile ducts are within normal limits for age. Pancreas: Negative except for some atrophy. Spleen: No splenic injury or perisplenic fluid. Adrenals/Urinary Tract: Adrenal glands and kidneys are within normal limits for age. Several small benign appearing renal cysts (no follow-up  imaging recommended). Unremarkable urinary bladder. Stomach/Bowel: Decompressed rectum. Redundant gas distended sigmoid colon. But decompressed upstream sigmoid and descending colon. Small volume of simple density free fluid in the left gutter on series 3, image 72. And there is mild to moderate regional descending colon bowel wall thickening (series 3, image 81) which is fairly circumferential but along a short segment of only 4-5 cm. Upstream colon is nondilated although there is abundant retained stool in redundant transverse colon. Negative right colon. No right gutter fluid. No pneumoperitoneum identified. No dilated small bowel. Small volume fluid in the stomach. Duodenum is decompressed. Vascular/Lymphatic: Infrarenal abdominal aortic aneurysm measures up to 4.4 cm diameter with abundant mural plaque or thrombus (series 3, image 74). Underlying Calcified aortic atherosclerosis. Major arterial structures including the IMA remain patent. Iliofemoral calcified atherosclerosis. Portal venous system appears to be patent. No lymphadenopathy. Reproductive: Surgically absent uterus. Diminutive or absent ovaries. Other: Scattered small volume of free fluid layering in the pelvis (series 3, image 103). Musculoskeletal: Osteopenia. Lumbar vertebrae appear intact with mild degenerative spondylolisthesis L3-L4 and L4-L5. Sacrum, SI joints, pelvis and proximal femurs appear intact. Trace ventral right abdominal wall soft tissue gas appears related to subcutaneous injection on series 3, image 90. And there is similar trace gas within the atrophied left abdominal wall muscle on image 91. IMPRESSION: 1. Osteopenia with definite post-traumatic fractures of right ribs 7 through 11 (7th rib fractured in two places). Possible nondisplaced right 3rd through 6th rib fractures also. Trace right pleural effusion. No pneumothorax or pulmonary contusion. 2. Abnormal but nonspecific free fluid in the left gutter and pelvis. Bowel Injury  is not excluded, and there is abnormal mural thickening of the descending colon in a segment of 4-5 cm. Differential considerations include Adenocarcinoma and focal Colitis. 3. No other acute traumatic injury identified; age indeterminate mild T4 and T7 compression fractures. 4. 4.4 cm infrarenal abdominal aortic aneurysm. Recommend follow-up every 12 months and Vascular Consultation. Aortic aneurysm NOS (ICD10-I71.9). Aortic Atherosclerosis (ICD10-I70.0). Reference: J Am Coll Radiol 2013;10:789-794. Electronically Signed   By: Odessa Fleming M.D.   On: 08/24/2021 09:28   DG Abd 2 Views  Result Date: 08/23/2021 CLINICAL DATA:  Multiple rib fractures EXAM: ABDOMEN - 2 VIEW COMPARISON:  Radiograph 08/22/2021 FINDINGS: There is gaseous distension of bowel in the lower abdomen in a nonobstructive pattern. No evidence of free intraperitoneal gas. There are minimally displaced fractures of the right lateral seventh and eighth ribs, and possible the ninth rib. IMPRESSION: Gaseous distention of bowel in the lower abdomen in a nonobstructive pattern. Minimally displaced fracture of the right lateral seventh and eighth ribs and possibly the right ninth rib. Electronically Signed   By: Caprice Renshaw M.D.   On: 08/23/2021 13:27    Anti-infectives: Anti-infectives (From admission, onward)    None        Assessment/Plan Fall against counter from standing Right rib fxs 7-11 and possibly 3-6 - follow up xray stable without PNX. Multimodal pain control and pulm toilet.  Left sided chest pain - no acute findings on imaging or exam to suggest traumatic injury. Cardiology  following for atypical chest pain and elevated troponin, obtaining ECHO Thickening of the descending colon - incidental finding on CT, no concern for acute bowel injury. Recommend f/u with PCP vs GI   ID - none FEN - HH diet VTE - heparin gtt Foley - none   Dispo - 6N. Therapies   COPD HTN CKD-IIIa GERD Hx SVT, HOCM AAA Carotid artery  disease Osteopenia   I reviewed last 24 h vitals and pain scores, last 48 h intake and output, last 24 h labs and trends, and last 24 h imaging results    LOS: 1 day    Franne Forts, [facility] Surgery 08/25/2021, 12:48 PM Please see Amion for pager number during day hours 7:00am-4:30pm

## 2021-08-25 NOTE — Progress Notes (Signed)
ANTICOAGULATION CONSULT NOTE  Pharmacy Consult for Heparin Indication: chest pain/ACS  Allergies  Allergen Reactions   Sulfa Antibiotics Itching and Hives   Codeine Nausea Only   Cymbalta [Duloxetine Hcl]     Unknown reaction   Fosamax [Alendronate]     Other reaction(s): upset hiatal hernia    Patient Measurements: Height: 5' (152.4 cm) Weight: 53.5 kg (118 lb) IBW/kg (Calculated) : 45.5  Heparin Dosing Weight: 54 kg  Vital Signs: Temp: 98 F (36.7 C) (08/09 0749) Temp Source: Oral (08/09 0749) BP: 141/68 (08/09 0749) Pulse Rate: 115 (08/09 0749)  Labs: Recent Labs    08/22/21 2250 08/23/21 0540 08/24/21 0823 08/24/21 1548 08/24/21 2138 08/25/21 0646 08/25/21 0647  HGB 11.4* 12.4 15.0  --   --  14.8  --   HCT 36.0 38.7 45.8  --   --  46.0  --   PLT 215 226 297  --   --  310  --   HEPARINUNFRC  --   --   --   --  0.89*  --  0.31  CREATININE 1.12* 1.03* 1.05*  --   --   --   --   TROPONINIHS  --   --  109* 247*  --   --   --     Estimated Creatinine Clearance: 25.1 mL/min (A) (by C-G formula based on SCr of 1.05 mg/dL (H)).   Assessment: 88 year old female history of COPD, hypertension, CKD stage IIIa, reflux presented to after a fall at home. Now with concerns for ACS/CP. Pharmacy consulted to initiate heparin infusion.   Heparin level 0.89 (supratherapeutic) on infusion at 650 units/hr. Heparin rate decreased to 550 units/hr now therapeutic at 0.31. No issues with heparin infusion or signs of bleeding reported.  Goal of Therapy:  Heparin level 0.3-0.7 units/ml Monitor platelets by anticoagulation protocol: Yes   Plan:  Continue heparin infusion at 550 units/hr Check heparin level in 8 hours and daily while on heparin F/u Cardiology recommendations, [facility] plans Continue to monitor H&H and platelets    Thank you for allowing pharmacy to be a part of this patient's care.  Thelma Barge, PharmD Clinical Pharmacist

## 2021-08-25 NOTE — Progress Notes (Signed)
Mobility Specialist - Progress Note   08/25/21 1000  Mobility  Activity Transferred from bed to chair  Level of Assistance Minimal assist, patient does 75% or more  Assistive Device Front wheel walker  Distance Ambulated (ft) 4 ft  Activity Response Tolerated fair  $Mobility charge 1 Mobility   Pt received in bed and agreeable to mobility. C/o rib pain. Pt to recliner after session with all needs met.  Roderick Pee Mobility Specialist

## 2021-08-25 NOTE — TOC Progression Note (Signed)
Transition of Care [facility]) - Progression Note    Patient Details  Name: Vicki Hernandez MRN: [id number] Date of Birth: 1928-06-11  Transition of Care [facility]) CM/SW Contact  Ivette Loyal, [place] Phone Number: 08/25/2021, 1:48 PM  Clinical Narrative:    Pt has a expected DC tomorrow, Berkley Harvey has been approved ref# [id number], facility has been informed.     Expected Discharge Plan: Skilled Nursing Facility Barriers to Discharge: Continued Medical Work up  Expected Discharge Plan and Services Expected Discharge Plan: Skilled Nursing Facility In-house Referral: Clinical Social Work Discharge Planning Services: CM Consult   Living arrangements for the past 2 months: Independent Living Facility                   DME Agency: NA       HH Arranged: NA           Social Determinants of Health (SDOH) Interventions    Readmission Risk Interventions     No data to display

## 2021-08-25 NOTE — Progress Notes (Signed)
  Echocardiogram 2D Echocardiogram has been performed.  Gerda Diss 08/25/2021, 12:06 PM

## 2021-08-25 NOTE — Progress Notes (Addendum)
Progress Note  Patient Name: Vicki Hernandez Date of Encounter: 08/25/2021  [facility] Cardiologist: Lance Muss, MD   Subjective   Patient states she has persistent left sided chest pain, despite movement or breathing,  states her fracture is on the right side, she has been trying to take pain meds. She denied SOB or dizzy.   Inpatient Medications    Scheduled Meds:  acetaminophen  1,000 mg Oral Q6H   aspirin EC  81 mg Oral Daily   docusate sodium  100 mg Oral BID   fentaNYL (SUBLIMAZE) injection  50 mcg Intravenous Once   fluticasone furoate-vilanterol  1 puff Inhalation Daily   And   umeclidinium bromide  1 puff Inhalation Daily   irbesartan  150 mg Oral Daily   lidocaine  1 patch Transdermal Q24H   lip balm   Topical BID   methocarbamol  500 mg Oral Q6H   polyethylene glycol  17 g Oral BID   simvastatin  40 mg Oral QPM   Continuous Infusions:  albumin human     heparin 550 Units/hr (08/24/21 2246)   methocarbamol (ROBAXIN) IV     ondansetron (ZOFRAN) IV     PRN Meds: acetaminophen **OR** acetaminophen, albumin human, alum & mag hydroxide-simeth, artificial tears, bisacodyl, diphenhydrAMINE, fentaNYL (SUBLIMAZE) injection, guaiFENesin-dextromethorphan, magic mouthwash, menthol-cetylpyridinium, methocarbamol (ROBAXIN) IV, metoprolol tartrate, naphazoline-glycerin, nitroGLYCERIN, ondansetron (ZOFRAN) IV **OR** ondansetron (ZOFRAN) IV, oxyCODONE, phenol, polyethylene glycol, prochlorperazine, sodium chloride   Vital Signs    Vitals:   08/24/21 2142 08/24/21 2200 08/25/21 0539 08/25/21 0749  BP: (!) 140/77 (!) 143/72 (!) 159/85 (!) 141/68  Pulse: (!) 117 (!) 110 (!) 117 (!) 115  Resp: 18 18 18 16   Temp: 98.2 F (36.8 C) 97.7 F (36.5 C) 97.7 F (36.5 C) 98 F (36.7 C)  TempSrc: Oral Oral Oral Oral  SpO2: 95% 96% 93% 97%  Weight:      Height:        Intake/Output Summary (Last 24 hours) at 08/25/2021 0905 Last data filed at 08/25/2021 0300 Gross per  24 hour  Intake 264.39 ml  Output --  Net 264.39 ml      08/22/2021    8:50 PM 05/01/2020   10:32 AM 03/18/2020    9:21 AM  Last 3 Weights  Weight (lbs) 118 lb 130 lb 130 lb 3.2 oz  Weight (kg) 53.524 kg 58.968 kg 59.058 kg      Telemetry    Sinus tachycardia 115s, PVCs  - Personally Reviewed  ECG    No new tracing today - Personally Reviewed  Physical Exam   GEN: No acute distress.   Neck: No JVD Cardiac: Regular rhythm, tachycardia, soft systolic murmur  Respiratory: Clear to auscultation bilaterally. On room air GI: Soft, nontender MS: No edema Neuro:  Nonfocal  Psych: Normal affect   Labs    High Sensitivity Troponin:   Recent Labs  Lab 08/24/21 0823 08/24/21 1548  TROPONINIHS 109* 247*     Chemistry Recent Labs  Lab 08/22/21 2250 08/23/21 0540 08/24/21 0823  NA 143 139 134*  K 4.3 4.0 3.9  CL 109 104 97*  CO2 28 29 23   GLUCOSE 127* 119* 130*  BUN 21 18 22   CREATININE 1.12* 1.03* 1.05*  CALCIUM 9.1 9.2 9.7  PROT 6.6  --  7.4  ALBUMIN 3.1*  --  3.5  AST 17  --  21  ALT 10  --  12  ALKPHOS 47  --  57  BILITOT 0.3  --  0.9  GFRNONAA 46* 51* 50*  ANIONGAP 6 6 14     Lipids No results for input(s): "CHOL", "TRIG", "HDL", "LABVLDL", "LDLCALC", "CHOLHDL" in the last 168 hours.  Hematology Recent Labs  Lab 08/23/21 0540 08/24/21 0823 08/25/21 0646  WBC 7.2 11.5* 14.2*  RBC 4.23 5.17* 5.13*  HGB 12.4 15.0 14.8  HCT 38.7 45.8 46.0  MCV 91.5 88.6 89.7  MCH 29.3 29.0 28.8  MCHC 32.0 32.8 32.2  RDW 15.1 14.6 15.2  PLT 226 297 310   Thyroid No results for input(s): "TSH", "FREET4" in the last 168 hours.  BNPNo results for input(s): "BNP", "PROBNP" in the last 168 hours.  DDimer No results for input(s): "DDIMER" in the last 168 hours.   Radiology    CT CHEST ABDOMEN PELVIS W CONTRAST  Result Date: 08/24/2021 CLINICAL DATA:  88 year old female status post recent fall with right side rib fractures. EXAM: CT CHEST, ABDOMEN, AND PELVIS WITH  CONTRAST TECHNIQUE: Multidetector CT imaging of the chest, abdomen and pelvis was performed following the standard protocol during bolus administration of intravenous contrast. RADIATION DOSE REDUCTION: This exam was performed according to the departmental dose-optimization program which includes automated exposure control, adjustment of the mA and/or kV according to patient size and/or use of iterative reconstruction technique. CONTRAST:  7mL OMNIPAQUE IOHEXOL 300 MG/ML  SOLN COMPARISON:  Right rib series 08/22/2021. FINDINGS: CT CHEST FINDINGS Cardiovascular: Calcified coronary artery atherosclerosis. Calcified aortic atherosclerosis. Mildly tortuous thoracic aorta. No cardiomegaly or pericardial effusion. Major mediastinal vascular structures appear intact. Mediastinum/Nodes: Negative. No mediastinal hematoma, lymphadenopathy, mass. Lungs/Pleura: Trace layering right pleural effusion. No pneumothorax. Major airways are patent. Mild perihilar bronchiectasis. Mild bilateral lung scarring at the lung apices and lung bases. No consolidation or convincing pulmonary contusion. Musculoskeletal: Osteopenia. Lateral and posterior right 7th through 11th rib fractures. Seventh 8th and 9th posterolateral fractures are all mildly displaced. Superimposed nondisplaced fracture of the anterior right 7th rib on series 4, image 131. And there could also be nondisplaced right 3rd through 6th anterior rib fractures. No sternal fracture. Visible shoulder osseous structures appear intact. Age indeterminate mild T7 compression fracture. Minor age indeterminate compression of the T4 superior endplate. CT ABDOMEN PELVIS FINDINGS Hepatobiliary: No discrete liver injury. But there is a small volume of free fluid in the gallbladder fossa (coronal image 44). Gallbladder is mildly distended the wall thickness remains normal and there is no other pericholecystic inflammation. Bile ducts are within normal limits for age. Pancreas: Negative  except for some atrophy. Spleen: No splenic injury or perisplenic fluid. Adrenals/Urinary Tract: Adrenal glands and kidneys are within normal limits for age. Several small benign appearing renal cysts (no follow-up imaging recommended). Unremarkable urinary bladder. Stomach/Bowel: Decompressed rectum. Redundant gas distended sigmoid colon. But decompressed upstream sigmoid and descending colon. Small volume of simple density free fluid in the left gutter on series 3, image 72. And there is mild to moderate regional descending colon bowel wall thickening (series 3, image 81) which is fairly circumferential but along a short segment of only 4-5 cm. Upstream colon is nondilated although there is abundant retained stool in redundant transverse colon. Negative right colon. No right gutter fluid. No pneumoperitoneum identified. No dilated small bowel. Small volume fluid in the stomach. Duodenum is decompressed. Vascular/Lymphatic: Infrarenal abdominal aortic aneurysm measures up to 4.4 cm diameter with abundant mural plaque or thrombus (series 3, image 74). Underlying Calcified aortic atherosclerosis. Major arterial structures including the IMA remain patent. Iliofemoral calcified atherosclerosis. Portal  venous system appears to be patent. No lymphadenopathy. Reproductive: Surgically absent uterus. Diminutive or absent ovaries. Other: Scattered small volume of free fluid layering in the pelvis (series 3, image 103). Musculoskeletal: Osteopenia. Lumbar vertebrae appear intact with mild degenerative spondylolisthesis L3-L4 and L4-L5. Sacrum, SI joints, pelvis and proximal femurs appear intact. Trace ventral right abdominal wall soft tissue gas appears related to subcutaneous injection on series 3, image 90. And there is similar trace gas within the atrophied left abdominal wall muscle on image 91. IMPRESSION: 1. Osteopenia with definite post-traumatic fractures of right ribs 7 through 11 (7th rib fractured in two places).  Possible nondisplaced right 3rd through 6th rib fractures also. Trace right pleural effusion. No pneumothorax or pulmonary contusion. 2. Abnormal but nonspecific free fluid in the left gutter and pelvis. Bowel Injury is not excluded, and there is abnormal mural thickening of the descending colon in a segment of 4-5 cm. Differential considerations include Adenocarcinoma and focal Colitis. 3. No other acute traumatic injury identified; age indeterminate mild T4 and T7 compression fractures. 4. 4.4 cm infrarenal abdominal aortic aneurysm. Recommend follow-up every 12 months and Vascular Consultation. Aortic aneurysm NOS (ICD10-I71.9). Aortic Atherosclerosis (ICD10-I70.0). Reference: J Am Coll Radiol 2013;10:789-794. Electronically Signed   By: Odessa Fleming M.D.   On: 08/24/2021 09:28   DG Abd 2 Views  Result Date: 08/23/2021 CLINICAL DATA:  Multiple rib fractures EXAM: ABDOMEN - 2 VIEW COMPARISON:  Radiograph 08/22/2021 FINDINGS: There is gaseous distension of bowel in the lower abdomen in a nonobstructive pattern. No evidence of free intraperitoneal gas. There are minimally displaced fractures of the right lateral seventh and eighth ribs, and possible the ninth rib. IMPRESSION: Gaseous distention of bowel in the lower abdomen in a nonobstructive pattern. Minimally displaced fracture of the right lateral seventh and eighth ribs and possibly the right ninth rib. Electronically Signed   By: Caprice Renshaw M.D.   On: 08/23/2021 13:27    Cardiac Studies   Echo is pending today  Patient Profile     Vicki Hernandez is a 88 y.o. female with a hx of supraventricular tachycardia, hypertension, peripheral vascular disease with prior carotid endarterectomy, hypertrophic cardiomyopathy, hypertension, hyperlipidemia admitted after a fall with resulting rib fractures , cardiology consulted for evaluation of chest pain.  Assessment & Plan    Chest pain - Echo 03/2020 with LVEF 65%, grade I DD, normal RV, trivial MR, flow  acceleration of the basal septum without LVOT obstruction, mildLAE - Nuclear study April 2022 showed ejection fraction of 80%, no ischemia or infarction - Multiple ribs fracture noted on CT POA - Hs trop 109 >247, POA - EKG no acute findings - suspect pain from rib fracture/MSK in origin, query myocardial contusion, rather ACS, pending Echo , if no acute abnormalities, would not pursue further ischemic workup  History of carotid artery disease s/p carotid endarterectomy -continue aspirin and statin.  History of SVT -patient is in sinus tachycardia 110s likely due to pain   Hypertension - BP mildly elevated likely exacerbated by pain, fair control overall on irbesartan   History of HOCM -most recent echocardiogram 3/22 showed flow acceleration at the proximal septum. - repeat Echo pending   Abdominal aortic aneurysm -will need follow-up ultrasound as an outpatient in one year  Abnormal abdominal CT -abnormal thickening of the descending colon, consider oncology workup if patient wishes   For questions or updates, please contact [facility] Please consult [email or web address] for contact info under    Signed, Cyndi Bender, NP  08/25/2021, 9:05 AM   As above, patient seen and examined.  She continues to have chest pain on the right from fractured ribs.  She also has persistent pain under her left breast that increases with palpation.  It has been continuous for greater than 36 hours.  Troponin is minimally elevated but not consistent with acute coronary syndrome.  Electrocardiogram showed no ST changes.  Will await follow-up echocardiogram.  If normal LV function would not pursue further ischemia evaluation. Kirk Ruths, MD

## 2021-08-26 DIAGNOSIS — S2241XA Multiple fractures of ribs, right side, initial encounter for closed fracture: Secondary | ICD-10-CM | POA: Diagnosis not present

## 2021-08-26 DIAGNOSIS — R079 Chest pain, unspecified: Secondary | ICD-10-CM | POA: Diagnosis not present

## 2021-08-26 LAB — BASIC METABOLIC PANEL
Anion gap: 10 (ref 5–15)
BUN: 45 mg/dL — ABNORMAL HIGH (ref 8–23)
CO2: 22 mmol/L (ref 22–32)
Calcium: 9.3 mg/dL (ref 8.9–10.3)
Chloride: 99 mmol/L (ref 98–111)
Creatinine, Ser: 1.19 mg/dL — ABNORMAL HIGH (ref 0.44–1.00)
GFR, Estimated: 43 mL/min — ABNORMAL LOW (ref >60)
Glucose, Bld: 113 mg/dL — ABNORMAL HIGH (ref 70–99)
Potassium: 4 mmol/L (ref 3.5–5.1)
Sodium: 131 mmol/L — ABNORMAL LOW (ref 135–145)

## 2021-08-26 LAB — CBC
HCT: 42 % (ref 36.0–46.0)
Hemoglobin: 13.8 g/dL (ref 12.0–15.0)
MCH: 29.2 pg (ref 26.0–34.0)
MCHC: 32.9 g/dL (ref 30.0–36.0)
MCV: 88.8 fL (ref 80.0–100.0)
Platelets: 280 10*3/uL (ref 150–400)
RBC: 4.73 MIL/uL (ref 3.87–5.11)
RDW: 15.3 % (ref 11.5–15.5)
WBC: 11.7 10*3/uL — ABNORMAL HIGH (ref 4.0–10.5)
nRBC: 0 % (ref 0.0–0.2)

## 2021-08-26 MED ORDER — ENOXAPARIN SODIUM 30 MG/0.3ML IJ SOSY
30.0000 mg | PREFILLED_SYRINGE | Freq: Every day | INTRAMUSCULAR | Status: DC
  Administered 2021-08-26 – 2021-08-27 (×2): 30 mg via SUBCUTANEOUS
  Filled 2021-08-26 (×2): qty 0.3

## 2021-08-26 MED ORDER — DICLOFENAC SODIUM 1 % EX GEL
2.0000 g | Freq: Four times a day (QID) | CUTANEOUS | Status: DC
  Administered 2021-08-26 – 2021-08-27 (×5): 2 g via TOPICAL
  Filled 2021-08-26: qty 100

## 2021-08-26 MED ORDER — BISACODYL 10 MG RE SUPP
10.0000 mg | Freq: Once | RECTAL | Status: DC
Start: 2021-08-26 — End: 2021-08-27

## 2021-08-26 MED ORDER — METOPROLOL TARTRATE 25 MG PO TABS
25.0000 mg | ORAL_TABLET | Freq: Two times a day (BID) | ORAL | Status: DC
  Administered 2021-08-26 – 2021-08-27 (×2): 25 mg via ORAL
  Filled 2021-08-26 (×2): qty 1

## 2021-08-26 MED ORDER — FLEET ENEMA 7-19 GM/118ML RE ENEM: 1.0000 | ENEMA | Freq: Every day | RECTAL | Status: DC | PRN

## 2021-08-26 NOTE — Progress Notes (Signed)
Progress Note  Patient Name: Vicki Hernandez Date of Encounter: 08/26/2021  [facility] Cardiologist: Larae Grooms, MD   Subjective   Continues with rib and CP; no dyspnea  Inpatient Medications    Scheduled Meds:  acetaminophen  1,000 mg Oral Q6H   aspirin EC  81 mg Oral Daily   bisacodyl  10 mg Rectal Once   diclofenac Sodium  2 g Topical QID   docusate sodium  100 mg Oral BID   fluticasone furoate-vilanterol  1 puff Inhalation Daily   And   umeclidinium bromide  1 puff Inhalation Daily   irbesartan  150 mg Oral Daily   lidocaine  2 patch Transdermal Daily   lip balm   Topical BID   methocarbamol  500 mg Oral Q6H   metoprolol tartrate  12.5 mg Oral BID   polyethylene glycol  17 g Oral BID   simvastatin  40 mg Oral QPM   Continuous Infusions:  methocarbamol (ROBAXIN) IV     ondansetron (ZOFRAN) IV     PRN Meds: acetaminophen **OR** acetaminophen, albuterol, alum & mag hydroxide-simeth, artificial tears, bisacodyl, diphenhydrAMINE, guaiFENesin-dextromethorphan, magic mouthwash, menthol-cetylpyridinium, methocarbamol (ROBAXIN) IV, metoprolol tartrate, naphazoline-glycerin, nitroGLYCERIN, ondansetron (ZOFRAN) IV **OR** ondansetron (ZOFRAN) IV, oxyCODONE, phenol, polyethylene glycol, prochlorperazine, sodium chloride   Vital Signs    Vitals:   08/26/21 0441 08/26/21 0725 08/26/21 0726 08/26/21 0820  BP: 139/78   (!) 154/72  Pulse: (!) 105   (!) 104  Resp: 18   16  Temp: 97.7 F (36.5 C)   97.7 F (36.5 C)  TempSrc: Oral   Oral  SpO2: 99% 98% 98% 97%  Weight:      Height:        Intake/Output Summary (Last 24 hours) at 08/26/2021 1016 Last data filed at 08/26/2021 0437 Gross per 24 hour  Intake 90 ml  Output 650 ml  Net -560 ml      08/22/2021    8:50 PM 05/01/2020   10:32 AM 03/18/2020    9:21 AM  Last 3 Weights  Weight (lbs) 118 lb 130 lb 130 lb 3.2 oz  Weight (kg) 53.524 kg 58.968 kg 59.058 kg      Telemetry    Sinus with PVCs- Personally  Reviewed   Physical Exam   GEN: No acute distress.   Neck: No JVD Cardiac: RRR, no murmurs, rubs, or gallops.  Respiratory: Clear to auscultation bilaterally. Tender left chest  GI: Soft, nontender, non-distended  MS: No edema Neuro:  Nonfocal  Psych: Normal affect   Labs    High Sensitivity Troponin:   Recent Labs  Lab 08/24/21 0823 08/24/21 1548  TROPONINIHS 109* 247*     Chemistry Recent Labs  Lab 08/22/21 2250 08/23/21 0540 08/24/21 0823 08/26/21 0131  NA 143 139 134* 131*  K 4.3 4.0 3.9 4.0  CL 109 104 97* 99  CO2 28 29 23 22   GLUCOSE 127* 119* 130* 113*  BUN 21 18 22  45*  CREATININE 1.12* 1.03* 1.05* 1.19*  CALCIUM 9.1 9.2 9.7 9.3  PROT 6.6  --  7.4  --   ALBUMIN 3.1*  --  3.5  --   AST 17  --  21  --   ALT 10  --  12  --   ALKPHOS 47  --  57  --   BILITOT 0.3  --  0.9  --   GFRNONAA 46* 51* 50* 43*  ANIONGAP 6 6 14 10    Hematology Recent Labs  Lab 08/24/21 0823 08/25/21 0646 08/26/21 0131  WBC 11.5* 14.2* 11.7*  RBC 5.17* 5.13* 4.73  HGB 15.0 14.8 13.8  HCT 45.8 46.0 42.0  MCV 88.6 89.7 88.8  MCH 29.0 28.8 29.2  MCHC 32.8 32.2 32.9  RDW 14.6 15.2 15.3  PLT 297 310 280    Radiology    ECHOCARDIOGRAM COMPLETE  Result Date: 08/25/2021    ECHOCARDIOGRAM REPORT   Patient Name:   Vicki Hernandez Date of Exam: 08/25/2021 Medical Rec #:  [id number]          Height:       60.0 in Accession #:    [id number]         Weight:       118.0 lb Date of Birth:  [birth year]           BSA:          1.492 m Patient Age:    88 years           BP:           106/49 mmHg Patient Gender: F                  HR:           110 bpm. Exam Location:  Inpatient Procedure: 2D Echo, Cardiac Doppler and Color Doppler Indications:    Chest pain  History:        Patient has no prior history of Echocardiogram examinations.                 Arrythmias:Atrial Fibrillation and SVT; Risk                 Factors:Hypertension and Dyslipidemia. PVD.  Sonographer:    Aron Baba  Sonographer#2:  Elbert Ewings Referring Phys: Cyndi Bender IMPRESSIONS  1. Left ventricular ejection fraction, by estimation, is 60 to 65%. The left ventricle has normal function. The left ventricle has no regional wall motion abnormalities. There is mild concentric left ventricular hypertrophy. Left ventricular diastolic parameters are indeterminate.  2. Right ventricular systolic function is normal. The right ventricular size is normal.  3. The mitral valve is normal in structure. No evidence of mitral valve regurgitation. No evidence of mitral stenosis.  4. The aortic valve is tricuspid. There is mild calcification of the aortic valve. There is mild thickening of the aortic valve. Aortic valve regurgitation is not visualized. Mild aortic valve stenosis.  5. The inferior vena cava is normal in size with greater than 50% respiratory variability, suggesting right atrial pressure of 3 mmHg. FINDINGS  Left Ventricle: Left ventricular ejection fraction, by estimation, is 60 to 65%. The left ventricle has normal function. The left ventricle has no regional wall motion abnormalities. The left ventricular internal cavity size was normal in size. There is  mild concentric left ventricular hypertrophy. Left ventricular diastolic parameters are indeterminate. Right Ventricle: The right ventricular size is normal. No increase in right ventricular wall thickness. Right ventricular systolic function is normal. Left Atrium: Left atrial size was normal in size. Right Atrium: Right atrial size was normal in size. Pericardium: There is no evidence of pericardial effusion. Presence of epicardial fat layer. Mitral Valve: The mitral valve is normal in structure. Mild mitral annular calcification. No evidence of mitral valve regurgitation. No evidence of mitral valve stenosis. Tricuspid Valve: The tricuspid valve is normal in structure. Tricuspid valve regurgitation is not demonstrated. No evidence of tricuspid stenosis. Aortic Valve: The  aortic valve is tricuspid.  There is mild calcification of the aortic valve. There is mild thickening of the aortic valve. There is mild aortic valve annular calcification. Aortic valve regurgitation is not visualized. Mild aortic stenosis is present. Aortic valve mean gradient measures 9.0 mmHg. Aortic valve peak gradient measures 15.5 mmHg. Aortic valve area, by VTI measures 1.32 cm. Pulmonic Valve: The pulmonic valve was normal in structure. Pulmonic valve regurgitation is not visualized. No evidence of pulmonic stenosis. Aorta: The aortic root is normal in size and structure. Venous: The inferior vena cava is normal in size with greater than 50% respiratory variability, suggesting right atrial pressure of 3 mmHg. IAS/Shunts: No atrial level shunt detected by color flow Doppler.  LEFT VENTRICLE PLAX 2D LVIDd:         2.90 cm LVIDs:         2.00 cm LV PW:         1.20 cm LV IVS:        1.30 cm LVOT diam:     1.90 cm LV SV:         37 LV SV Index:   25 LVOT Area:     2.84 cm  RIGHT VENTRICLE             IVC RV S prime:     15.60 cm/s  IVC diam: 0.70 cm TAPSE (M-mode): 2.4 cm LEFT ATRIUM             Index        RIGHT ATRIUM           Index LA diam:        2.00 cm 1.34 cm/m   RA Area:     10.20 cm LA Vol (A2C):   23.0 ml 15.42 ml/m  RA Volume:   24.10 ml  16.16 ml/m LA Vol (A4C):   14.0 ml 9.39 ml/m LA Biplane Vol: 18.5 ml 12.40 ml/m  AORTIC VALVE AV Area (Vmax):    1.42 cm AV Area (Vmean):   1.28 cm AV Area (VTI):     1.32 cm AV Vmax:           197.00 cm/s AV Vmean:          136.000 cm/s AV VTI:            0.281 m AV Peak Grad:      15.5 mmHg AV Mean Grad:      9.0 mmHg LVOT Vmax:         98.50 cm/s LVOT Vmean:        61.600 cm/s LVOT VTI:          0.131 m LVOT/AV VTI ratio: 0.47  AORTA Ao Root diam: 2.60 cm Ao Asc diam:  3.00 cm  SHUNTS Systemic VTI:  0.13 m Systemic Diam: 1.90 cm Kardie Tobb DO Electronically signed by Thomasene Ripple DO Signature Date/Time: 08/25/2021/12:33:20 PM    Final    DG CHEST PORT  1 VIEW  Result Date: 08/25/2021 CLINICAL DATA:  Acute left-sided chest pain. EXAM: PORTABLE CHEST 1 VIEW COMPARISON:  08/22/2021 FINDINGS: The heart size and mediastinal contours are within normal limits. Aortic atherosclerotic calcification incidentally noted. Both lungs are clear. No evidence of pneumothorax or pleural effusion. Several mildly displaced right lateral rib fractures are again noted. IMPRESSION: No active cardiopulmonary disease. Several mildly displaced right lateral rib fractures again noted. Electronically Signed   By: Danae Orleans M.D.   On: 08/25/2021 10:39     Patient Profile  88 y.o. female with past medical history of SVT, hypertension, peripheral vascular disease with prior carotid endarterectomy, question mild hypertrophic cardiomyopathy, hypertension, hyperlipidemia admitted after falling with rib fractures being evaluated for chest pain.  Echocardiogram this admission shows normal LV function and question mild aortic stenosis (mean gradient 9 mmHg).  Assessment & Plan    Chest pain-as outlined previously symptoms were felt to be very atypical. Continuous for 3 days without completely resolving. They did increase with palpation and possibly secondary to musculoskeletal pain.  Troponin minimally elevated but no diagnostic ECG changes.  Echocardiogram shows normal LV function.  Would not pursue further ischemia evaluation.   History of carotid artery disease/carotid endarterectomy-continue aspirin and statin. History of SVT-patient remains in sinus rhythm. Hypertension-continue present medications. Question mild aortic stenosis-however mean gradient only 9 mmHg.  Can be followed as an outpatient. Abdominal aortic aneurysm-we will need follow-up ultrasound as an outpatient in one year. Abnormal abdominal CT-abnormal thickening of the descending colon.  Will need follow-up with primary care.  Cardiology will sign off.  Please call with questions.  Would continue present  medications at discharge.  I will arrange follow-up with Dr. Irish Lack 4-6 weeks following discharge.  For questions or updates, please contact [place] Please consult [email or web address] for contact info under        Signed, Kirk Ruths, MD  08/26/2021, 10:16 AM

## 2021-08-26 NOTE — Progress Notes (Signed)
[facility] Progress Note     Subjective: CC-  Main complaint is continued left sided chest pain. Pain from right rib fractures management with current medication regimen. Denies SOB. Pulling 500 on IS.  Objective: Vital signs in last 24 hours: Temp:  [97.5 F (36.4 C)-98.7 F (37.1 C)] 97.7 F (36.5 C) (08/10 0820) Pulse Rate:  [75-105] 104 (08/10 0820) Resp:  [16-18] 16 (08/10 0820) BP: (139-154)/(72-78) 154/72 (08/10 0820) SpO2:  [95 %-99 %] 97 % (08/10 0820) Last BM Date : 08/22/21  Intake/Output from previous day: 08/09 0701 - 08/10 0700 In: 90 [P.O.:90] Out: 650 [Urine:650] Intake/Output this shift: No intake/output data recorded.  PE: Gen:  Alert, NAD, pleasant HEENT: EOM's intact, pupils equal and round Card:  tachycardic HR 100s Pulm:  CTAB, no W/R/R, rate and effort normal on room air, pulling 500 on IS Abd: Soft, protuberant, NT Psych: A&Ox4  Skin: no rashes noted, warm and dry   Lab Results:  Recent Labs    08/25/21 0646 08/26/21 0131  WBC 14.2* 11.7*  HGB 14.8 13.8  HCT 46.0 42.0  PLT 310 280   BMET Recent Labs    08/24/21 0823 08/26/21 0131  NA 134* 131*  K 3.9 4.0  CL 97* 99  CO2 23 22  GLUCOSE 130* 113*  BUN 22 45*  CREATININE 1.05* 1.19*  CALCIUM 9.7 9.3   PT/INR No results for input(s): "LABPROT", "INR" in the last 72 hours. CMP     Component Value Date/Time   NA 131 (L) 08/26/2021 0131   NA 139 03/18/2020 1003   K 4.0 08/26/2021 0131   CL 99 08/26/2021 0131   CO2 22 08/26/2021 0131   GLUCOSE 113 (H) 08/26/2021 0131   BUN 45 (H) 08/26/2021 0131   BUN 14 03/18/2020 1003   CREATININE 1.19 (H) 08/26/2021 0131   CALCIUM 9.3 08/26/2021 0131   PROT 7.4 08/24/2021 0823   ALBUMIN 3.5 08/24/2021 0823   AST 21 08/24/2021 0823   ALT 12 08/24/2021 0823   ALKPHOS 57 08/24/2021 0823   BILITOT 0.9 08/24/2021 0823   GFRNONAA 43 (L) 08/26/2021 0131   GFRAA 51 (L) 05/29/2016 1222   Lipase  No results found for:  "LIPASE"     Studies/Results: ECHOCARDIOGRAM COMPLETE  Result Date: 08/25/2021    ECHOCARDIOGRAM REPORT   Patient Name:   Vicki Hernandez Date of Exam: 08/25/2021 Medical Rec #:  [id number]          Height:       60.0 in Accession #:    [id number]         Weight:       118.0 lb Date of Birth:  April 24, 1928           BSA:          1.492 m Patient Age:    88 years           BP:           106/49 mmHg Patient Gender: F                  HR:           110 bpm. Exam Location:  Inpatient Procedure: 2D Echo, Cardiac Doppler and Color Doppler Indications:    Chest pain  History:        Patient has no prior history of Echocardiogram examinations.                 Arrythmias:Atrial  Fibrillation and SVT; Risk                 Factors:Hypertension and Dyslipidemia. PVD.  Sonographer:    Aron Baba Sonographer#2:  Elbert Ewings Referring Phys: Cyndi Bender IMPRESSIONS  1. Left ventricular ejection fraction, by estimation, is 60 to 65%. The left ventricle has normal function. The left ventricle has no regional wall motion abnormalities. There is mild concentric left ventricular hypertrophy. Left ventricular diastolic parameters are indeterminate.  2. Right ventricular systolic function is normal. The right ventricular size is normal.  3. The mitral valve is normal in structure. No evidence of mitral valve regurgitation. No evidence of mitral stenosis.  4. The aortic valve is tricuspid. There is mild calcification of the aortic valve. There is mild thickening of the aortic valve. Aortic valve regurgitation is not visualized. Mild aortic valve stenosis.  5. The inferior vena cava is normal in size with greater than 50% respiratory variability, suggesting right atrial pressure of 3 mmHg. FINDINGS  Left Ventricle: Left ventricular ejection fraction, by estimation, is 60 to 65%. The left ventricle has normal function. The left ventricle has no regional wall motion abnormalities. The left ventricular internal cavity size was normal in  size. There is  mild concentric left ventricular hypertrophy. Left ventricular diastolic parameters are indeterminate. Right Ventricle: The right ventricular size is normal. No increase in right ventricular wall thickness. Right ventricular systolic function is normal. Left Atrium: Left atrial size was normal in size. Right Atrium: Right atrial size was normal in size. Pericardium: There is no evidence of pericardial effusion. Presence of epicardial fat layer. Mitral Valve: The mitral valve is normal in structure. Mild mitral annular calcification. No evidence of mitral valve regurgitation. No evidence of mitral valve stenosis. Tricuspid Valve: The tricuspid valve is normal in structure. Tricuspid valve regurgitation is not demonstrated. No evidence of tricuspid stenosis. Aortic Valve: The aortic valve is tricuspid. There is mild calcification of the aortic valve. There is mild thickening of the aortic valve. There is mild aortic valve annular calcification. Aortic valve regurgitation is not visualized. Mild aortic stenosis is present. Aortic valve mean gradient measures 9.0 mmHg. Aortic valve peak gradient measures 15.5 mmHg. Aortic valve area, by VTI measures 1.32 cm. Pulmonic Valve: The pulmonic valve was normal in structure. Pulmonic valve regurgitation is not visualized. No evidence of pulmonic stenosis. Aorta: The aortic root is normal in size and structure. Venous: The inferior vena cava is normal in size with greater than 50% respiratory variability, suggesting right atrial pressure of 3 mmHg. IAS/Shunts: No atrial level shunt detected by color flow Doppler.  LEFT VENTRICLE PLAX 2D LVIDd:         2.90 cm LVIDs:         2.00 cm LV PW:         1.20 cm LV IVS:        1.30 cm LVOT diam:     1.90 cm LV SV:         37 LV SV Index:   25 LVOT Area:     2.84 cm  RIGHT VENTRICLE             IVC RV S prime:     15.60 cm/s  IVC diam: 0.70 cm TAPSE (M-mode): 2.4 cm LEFT ATRIUM             Index        RIGHT ATRIUM            Index LA  diam:        2.00 cm 1.34 cm/m   RA Area:     10.20 cm LA Vol (A2C):   23.0 ml 15.42 ml/m  RA Volume:   24.10 ml  16.16 ml/m LA Vol (A4C):   14.0 ml 9.39 ml/m LA Biplane Vol: 18.5 ml 12.40 ml/m  AORTIC VALVE AV Area (Vmax):    1.42 cm AV Area (Vmean):   1.28 cm AV Area (VTI):     1.32 cm AV Vmax:           197.00 cm/s AV Vmean:          136.000 cm/s AV VTI:            0.281 m AV Peak Grad:      15.5 mmHg AV Mean Grad:      9.0 mmHg LVOT Vmax:         98.50 cm/s LVOT Vmean:        61.600 cm/s LVOT VTI:          0.131 m LVOT/AV VTI ratio: 0.47  AORTA Ao Root diam: 2.60 cm Ao Asc diam:  3.00 cm  SHUNTS Systemic VTI:  0.13 m Systemic Diam: 1.90 cm Kardie Tobb DO Electronically signed by Thomasene Ripple DO Signature Date/Time: 08/25/2021/12:33:20 PM    Final    DG CHEST PORT 1 VIEW  Result Date: 08/25/2021 CLINICAL DATA:  Acute left-sided chest pain. EXAM: PORTABLE CHEST 1 VIEW COMPARISON:  08/22/2021 FINDINGS: The heart size and mediastinal contours are within normal limits. Aortic atherosclerotic calcification incidentally noted. Both lungs are clear. No evidence of pneumothorax or pleural effusion. Several mildly displaced right lateral rib fractures are again noted. IMPRESSION: No active cardiopulmonary disease. Several mildly displaced right lateral rib fractures again noted. Electronically Signed   By: Danae Orleans M.D.   On: 08/25/2021 10:39    Anti-infectives: Anti-infectives (From admission, onward)    None        Assessment/Plan Fall against counter from standing Right rib fxs 7-11 and possibly 3-6 - follow up xray stable without PNX. Multimodal pain control and pulm toilet.  Left sided chest pain - atypical, cardiology has seen and s/o. Lidocaine patch helping Thickening of the descending colon - incidental finding on CT, no concern for acute bowel injury. Recommend f/u with PCP vs GI   ID - none FEN - HH diet VTE - lovenox Foley - none   Dispo - Trauma will sign off,  please call with questions or concerns.    COPD HTN CKD-IIIa GERD Hx SVT, HOCM AAA Carotid artery disease Osteopenia   I reviewed last 24 h vitals and pain scores, last 48 h intake and output, last 24 h labs and trends, and last 24 h imaging results    LOS: 2 days    Franne Forts, [facility] 08/26/2021, 12:42 PM Please see Amion for pager number during day hours 7:00am-4:30pm

## 2021-08-26 NOTE — TOC Progression Note (Signed)
Transition of Care [facility]) - Progression Note    Patient Details  Name: Vicki Hernandez MRN: [id number] Date of Birth: 1928/04/11  [facility] CM/SW Contact  Ivette Loyal, LCSWA Phone Number: 08/26/2021, 11:09 AM  Clinical Narrative:    Pt not medically stable, may DC tomorrow. CSW following.    Expected Discharge Plan: Skilled Nursing Facility Barriers to Discharge: Continued Medical Work up  Expected Discharge Plan and Services Expected Discharge Plan: Skilled Nursing Facility In-house Referral: Clinical Social Work Discharge Planning Services: CM Consult   Living arrangements for the past 2 months: Independent Living Facility                   DME Agency: NA       HH Arranged: NA           Social Determinants of Health (SDOH) Interventions    Readmission Risk Interventions     No data to display

## 2021-08-26 NOTE — Progress Notes (Signed)
Mobility Specialist - Progress Note   08/26/21 1459  Mobility  Activity Ambulated with assistance in hallway  Level of Assistance Minimal assist, patient does 75% or more  Assistive Device Front wheel walker  Distance Ambulated (ft) 70 ft  Activity Response Tolerated fair  $Mobility charge 1 Mobility   Pt received in bed and agreeable to mobility. C/o rib pain. Pt to chair after session with all needs met.   Roderick Pee Mobility Specialist

## 2021-08-26 NOTE — Progress Notes (Signed)
, PROGRESS NOTE    MADDELINE TAVERAS  [id number] DOB: Apr 08, 1928 DOA: 08/22/2021 PCP: Lawerance Cruel, MD   Brief Narrative:  88 year old female history of COPD, hypertension, CKD stage IIIa, reflux presents to the ER today after a fall at home.  Patient was try to take the garbage bag out of the garbage can.  She lost her balance.  She struck the right side of her rib cage against the edge of the counter.  She had immediate pain. Rib imaging demonstrates acute fracture of the right seventh and eighth ribs.  No pneumothorax.  -would like to be a DNR  Subjective: Pain better with patch  Assessment & Plan:   Principal Problem:   Multiple fractures of ribs, right side, initial encounter for closed fracture Active Problems:   Multiple rib fractures - right 7-8   Essential hypertension, benign   Chronic obstructive pulmonary disease, unspecified ([place])   Fall   Chest pain - New onset left-sided chest pain -Cardiology consult- echo done- signed off -Sublingual nitroglycerin as needed. -much improved with lidocaine patch   Multiple rib fractures - right 3-11  Trauma service saw in consultation. Continue with pain control. Family requested SNF/STR   Fall Pt fell over trying to remove garbage bag from garbage can. Did not fall to floor but right her right rib cage against counter edge. PT OT are recommending SNF.   Chronic obstructive pulmonary disease, unspecified ([place]) Stable. Not exacerbated.   Essential hypertension, benign Continue home po htn meds.     DVT prophylaxis: SQ Heparin Code Status: DNR    Family Communication:   Disposition Plan:   SNF in AM Consults called: Trauma surgery, cardiology    Procedures:  ECHOCARDIOGRAM COMPLETE  Result Date: 08/25/2021    ECHOCARDIOGRAM REPORT   Patient Name:   PEACE YAGER Date of Exam: 08/25/2021 Medical Rec #:  [id number]          Height:       60.0 in Accession #:    [id number]         Weight:       118.0 lb  Date of Birth:  [birth year]           BSA:          1.492 m Patient Age:    41 years           BP:           106/49 mmHg Patient Gender: F                  HR:           110 bpm. Exam Location:  Inpatient Procedure: 2D Echo, Cardiac Doppler and Color Doppler Indications:    Chest pain  History:        Patient has no prior history of Echocardiogram examinations.                 Arrythmias:Atrial Fibrillation and SVT; Risk                 Factors:Hypertension and Dyslipidemia. PVD.  Sonographer:    Greer Pickerel Sonographer#2:  Vaughan Basta Referring Phys: Margie Billet IMPRESSIONS  1. Left ventricular ejection fraction, by estimation, is 60 to 65%. The left ventricle has normal function. The left ventricle has no regional wall motion abnormalities. There is mild concentric left ventricular hypertrophy. Left ventricular diastolic parameters are indeterminate.  2. Right ventricular systolic function is normal. The right ventricular size  is normal.  3. The mitral valve is normal in structure. No evidence of mitral valve regurgitation. No evidence of mitral stenosis.  4. The aortic valve is tricuspid. There is mild calcification of the aortic valve. There is mild thickening of the aortic valve. Aortic valve regurgitation is not visualized. Mild aortic valve stenosis.  5. The inferior vena cava is normal in size with greater than 50% respiratory variability, suggesting right atrial pressure of 3 mmHg. FINDINGS  Left Ventricle: Left ventricular ejection fraction, by estimation, is 60 to 65%. The left ventricle has normal function. The left ventricle has no regional wall motion abnormalities. The left ventricular internal cavity size was normal in size. There is  mild concentric left ventricular hypertrophy. Left ventricular diastolic parameters are indeterminate. Right Ventricle: The right ventricular size is normal. No increase in right ventricular wall thickness. Right ventricular systolic function is normal. Left Atrium: Left  atrial size was normal in size. Right Atrium: Right atrial size was normal in size. Pericardium: There is no evidence of pericardial effusion. Presence of epicardial fat layer. Mitral Valve: The mitral valve is normal in structure. Mild mitral annular calcification. No evidence of mitral valve regurgitation. No evidence of mitral valve stenosis. Tricuspid Valve: The tricuspid valve is normal in structure. Tricuspid valve regurgitation is not demonstrated. No evidence of tricuspid stenosis. Aortic Valve: The aortic valve is tricuspid. There is mild calcification of the aortic valve. There is mild thickening of the aortic valve. There is mild aortic valve annular calcification. Aortic valve regurgitation is not visualized. Mild aortic stenosis is present. Aortic valve mean gradient measures 9.0 mmHg. Aortic valve peak gradient measures 15.5 mmHg. Aortic valve area, by VTI measures 1.32 cm. Pulmonic Valve: The pulmonic valve was normal in structure. Pulmonic valve regurgitation is not visualized. No evidence of pulmonic stenosis. Aorta: The aortic root is normal in size and structure. Venous: The inferior vena cava is normal in size with greater than 50% respiratory variability, suggesting right atrial pressure of 3 mmHg. IAS/Shunts: No atrial level shunt detected by color flow Doppler.  LEFT VENTRICLE PLAX 2D LVIDd:         2.90 cm LVIDs:         2.00 cm LV PW:         1.20 cm LV IVS:        1.30 cm LVOT diam:     1.90 cm LV SV:         37 LV SV Index:   25 LVOT Area:     2.84 cm  RIGHT VENTRICLE             IVC RV S prime:     15.60 cm/s  IVC diam: 0.70 cm TAPSE (M-mode): 2.4 cm LEFT ATRIUM             Index        RIGHT ATRIUM           Index LA diam:        2.00 cm 1.34 cm/m   RA Area:     10.20 cm LA Vol (A2C):   23.0 ml 15.42 ml/m  RA Volume:   24.10 ml  16.16 ml/m LA Vol (A4C):   14.0 ml 9.39 ml/m LA Biplane Vol: 18.5 ml 12.40 ml/m  AORTIC VALVE AV Area (Vmax):    1.42 cm AV Area (Vmean):   1.28 cm AV  Area (VTI):     1.32 cm AV Vmax:  197.00 cm/s AV Vmean:          136.000 cm/s AV VTI:            0.281 m AV Peak Grad:      15.5 mmHg AV Mean Grad:      9.0 mmHg LVOT Vmax:         98.50 cm/s LVOT Vmean:        61.600 cm/s LVOT VTI:          0.131 m LVOT/AV VTI ratio: 0.47  AORTA Ao Root diam: 2.60 cm Ao Asc diam:  3.00 cm  SHUNTS Systemic VTI:  0.13 m Systemic Diam: 1.90 cm Kardie Tobb DO Electronically signed by Berniece Salines DO Signature Date/Time: 08/25/2021/12:33:20 PM    Final    DG CHEST PORT 1 VIEW  Result Date: 08/25/2021 CLINICAL DATA:  Acute left-sided chest pain. EXAM: PORTABLE CHEST 1 VIEW COMPARISON:  08/22/2021 FINDINGS: The heart size and mediastinal contours are within normal limits. Aortic atherosclerotic calcification incidentally noted. Both lungs are clear. No evidence of pneumothorax or pleural effusion. Several mildly displaced right lateral rib fractures are again noted. IMPRESSION: No active cardiopulmonary disease. Several mildly displaced right lateral rib fractures again noted. Electronically Signed   By: Marlaine Hind M.D.   On: 08/25/2021 10:39   CT CHEST ABDOMEN PELVIS W CONTRAST  Result Date: 08/24/2021 CLINICAL DATA:  88 year old female status post recent fall with right side rib fractures. EXAM: CT CHEST, ABDOMEN, AND PELVIS WITH CONTRAST TECHNIQUE: Multidetector CT imaging of the chest, abdomen and pelvis was performed following the standard protocol during bolus administration of intravenous contrast. RADIATION DOSE REDUCTION: This exam was performed according to the departmental dose-optimization program which includes automated exposure control, adjustment of the mA and/or kV according to patient size and/or use of iterative reconstruction technique. CONTRAST:  46mL OMNIPAQUE IOHEXOL 300 MG/ML  SOLN COMPARISON:  Right rib series 08/22/2021. FINDINGS: CT CHEST FINDINGS Cardiovascular: Calcified coronary artery atherosclerosis. Calcified aortic atherosclerosis. Mildly  tortuous thoracic aorta. No cardiomegaly or pericardial effusion. Major mediastinal vascular structures appear intact. Mediastinum/Nodes: Negative. No mediastinal hematoma, lymphadenopathy, mass. Lungs/Pleura: Trace layering right pleural effusion. No pneumothorax. Major airways are patent. Mild perihilar bronchiectasis. Mild bilateral lung scarring at the lung apices and lung bases. No consolidation or convincing pulmonary contusion. Musculoskeletal: Osteopenia. Lateral and posterior right 7th through 11th rib fractures. Seventh 8th and 9th posterolateral fractures are all mildly displaced. Superimposed nondisplaced fracture of the anterior right 7th rib on series 4, image 131. And there could also be nondisplaced right 3rd through 6th anterior rib fractures. No sternal fracture. Visible shoulder osseous structures appear intact. Age indeterminate mild T7 compression fracture. Minor age indeterminate compression of the T4 superior endplate. CT ABDOMEN PELVIS FINDINGS Hepatobiliary: No discrete liver injury. But there is a small volume of free fluid in the gallbladder fossa (coronal image 44). Gallbladder is mildly distended the wall thickness remains normal and there is no other pericholecystic inflammation. Bile ducts are within normal limits for age. Pancreas: Negative except for some atrophy. Spleen: No splenic injury or perisplenic fluid. Adrenals/Urinary Tract: Adrenal glands and kidneys are within normal limits for age. Several small benign appearing renal cysts (no follow-up imaging recommended). Unremarkable urinary bladder. Stomach/Bowel: Decompressed rectum. Redundant gas distended sigmoid colon. But decompressed upstream sigmoid and descending colon. Small volume of simple density free fluid in the left gutter on series 3, image 72. And there is mild to moderate regional descending colon bowel wall thickening (series 3, image  81) which is fairly circumferential but along a short segment of only 4-5 cm.  Upstream colon is nondilated although there is abundant retained stool in redundant transverse colon. Negative right colon. No right gutter fluid. No pneumoperitoneum identified. No dilated small bowel. Small volume fluid in the stomach. Duodenum is decompressed. Vascular/Lymphatic: Infrarenal abdominal aortic aneurysm measures up to 4.4 cm diameter with abundant mural plaque or thrombus (series 3, image 74). Underlying Calcified aortic atherosclerosis. Major arterial structures including the IMA remain patent. Iliofemoral calcified atherosclerosis. Portal venous system appears to be patent. No lymphadenopathy. Reproductive: Surgically absent uterus. Diminutive or absent ovaries. Other: Scattered small volume of free fluid layering in the pelvis (series 3, image 103). Musculoskeletal: Osteopenia. Lumbar vertebrae appear intact with mild degenerative spondylolisthesis L3-L4 and L4-L5. Sacrum, SI joints, pelvis and proximal femurs appear intact. Trace ventral right abdominal wall soft tissue gas appears related to subcutaneous injection on series 3, image 90. And there is similar trace gas within the atrophied left abdominal wall muscle on image 91. IMPRESSION: 1. Osteopenia with definite post-traumatic fractures of right ribs 7 through 11 (7th rib fractured in two places). Possible nondisplaced right 3rd through 6th rib fractures also. Trace right pleural effusion. No pneumothorax or pulmonary contusion. 2. Abnormal but nonspecific free fluid in the left gutter and pelvis. Bowel Injury is not excluded, and there is abnormal mural thickening of the descending colon in a segment of 4-5 cm. Differential considerations include Adenocarcinoma and focal Colitis. 3. No other acute traumatic injury identified; age indeterminate mild T4 and T7 compression fractures. 4. 4.4 cm infrarenal abdominal aortic aneurysm. Recommend follow-up every 12 months and Vascular Consultation. Aortic aneurysm NOS (ICD10-I71.9). Aortic  Atherosclerosis (ICD10-I70.0). Reference: J Am Coll Radiol [id number]. Electronically Signed   By: Genevie Ann M.D.   On: 08/24/2021 09:28   DG Abd 2 Views  Result Date: 08/23/2021 CLINICAL DATA:  Multiple rib fractures EXAM: ABDOMEN - 2 VIEW COMPARISON:  Radiograph 08/22/2021 FINDINGS: There is gaseous distension of bowel in the lower abdomen in a nonobstructive pattern. No evidence of free intraperitoneal gas. There are minimally displaced fractures of the right lateral seventh and eighth ribs, and possible the ninth rib. IMPRESSION: Gaseous distention of bowel in the lower abdomen in a nonobstructive pattern. Minimally displaced fracture of the right lateral seventh and eighth ribs and possibly the right ninth rib. Electronically Signed   By: Maurine Simmering M.D.   On: 08/23/2021 13:27   DG Ribs Unilateral W/Chest Right  Result Date: 08/22/2021 CLINICAL DATA:  Right rib pain EXAM: RIGHT RIBS AND CHEST - 3+ VIEW COMPARISON:  None Available. FINDINGS: There is an acute minimally displaced fracture of the right 7 and 8 ribs laterally. Lungs are clear. No pneumothorax or pleural effusion. Cardiac size within normal limits. Pulmonary vascularity is normal. IMPRESSION: Acute minimally displaced fractures of the right seventh and eighth ribs laterally. No pneumothorax. Electronically Signed   By: Fidela Salisbury M.D.   On: 08/22/2021 22:11     Objective: Vitals:   08/26/21 0441 08/26/21 0725 08/26/21 0726 08/26/21 0820  BP: 139/78   (!) 154/72  Pulse: (!) 105   (!) 104  Resp: 18   16  Temp: 97.7 F (36.5 C)   97.7 F (36.5 C)  TempSrc: Oral   Oral  SpO2: 99% 98% 98% 97%  Weight:      Height:        Intake/Output Summary (Last 24 hours) at 08/26/2021 1143 Last data filed at 08/26/2021 [phone]  Gross per 24 hour  Intake 90 ml  Output 650 ml  Net -560 ml   Filed Weights   08/22/21 2050  Weight: 53.5 kg    Examination:   General: Appearance:    Well developed, well nourished female in no acute  distress     Lungs:     respirations unlabored  Heart:    Tachycardic.   MS:   All extremities are intact.   Neurologic:   Awake, alert        Data Reviewed: I have personally reviewed following labs and imaging studies  CBC: Recent Labs  Lab 08/22/21 2250 08/23/21 0540 08/24/21 0823 08/25/21 0646 08/26/21 0131  WBC 8.0 7.2 11.5* 14.2* 11.7*  NEUTROABS 5.7  --   --   --   --   HGB 11.4* 12.4 15.0 14.8 13.8  HCT 36.0 38.7 45.8 46.0 42.0  MCV 94.2 91.5 88.6 89.7 88.8  PLT 215 [phone] [id number]   Basic Metabolic Panel: Recent Labs  Lab 08/22/21 2250 08/23/21 0540 08/24/21 0823 08/26/21 0131  NA 143 139 134* 131*  K 4.3 4.0 3.9 4.0  CL 109 104 97* 99  CO2 28 29 23 22   GLUCOSE 127* 119* 130* 113*  BUN 21 18 22  45*  CREATININE 1.12* 1.03* 1.05* 1.19*  CALCIUM 9.1 9.2 9.7 9.3   GFR: Estimated Creatinine Clearance: 22.1 mL/min (A) (by C-G formula based on SCr of 1.19 mg/dL (H)). Liver Function Tests: Recent Labs  Lab 08/22/21 2250 08/24/21 0823  AST 17 21  ALT 10 12  ALKPHOS 47 57  BILITOT 0.3 0.9  PROT 6.6 7.4  ALBUMIN 3.1* 3.5   No results for input(s): "LIPASE", "AMYLASE" in the last 168 hours. No results for input(s): "AMMONIA" in the last 168 hours. Coagulation Profile: No results for input(s): "INR", "PROTIME" in the last 168 hours. Cardiac Enzymes: No results for input(s): "CKTOTAL", "CKMB", "CKMBINDEX", "TROPONINI" in the last 168 hours. BNP (last 3 results) No results for input(s): "PROBNP" in the last 8760 hours. HbA1C: No results for input(s): "HGBA1C" in the last 72 hours. CBG: No results for input(s): "GLUCAP" in the last 168 hours. Lipid Profile: No results for input(s): "CHOL", "HDL", "LDLCALC", "TRIG", "CHOLHDL", "LDLDIRECT" in the last 72 hours. Thyroid Function Tests: No results for input(s): "TSH", "T4TOTAL", "FREET4", "T3FREE", "THYROIDAB" in the last 72 hours. Anemia Panel: No results for input(s): "VITAMINB12", "FOLATE",  "FERRITIN", "TIBC", "IRON", "RETICCTPCT" in the last 72 hours. Sepsis Labs: No results for input(s): "PROCALCITON", "LATICACIDVEN" in the last 168 hours.  No results found for this or any previous visit (from the past 240 hour(s)).       Radiology Studies: ECHOCARDIOGRAM COMPLETE  Result Date: 08/25/2021    ECHOCARDIOGRAM REPORT   Patient Name:   ADORAH NALBONE Date of Exam: 08/25/2021 Medical Rec #:  [id number]          Height:       60.0 in Accession #:    [id number]         Weight:       118.0 lb Date of Birth:  1928-12-08           BSA:          1.492 m Patient Age:    39 years           BP:           106/49 mmHg Patient Gender: F  HR:           110 bpm. Exam Location:  Inpatient Procedure: 2D Echo, Cardiac Doppler and Color Doppler Indications:    Chest pain  History:        Patient has no prior history of Echocardiogram examinations.                 Arrythmias:Atrial Fibrillation and SVT; Risk                 Factors:Hypertension and Dyslipidemia. PVD.  Sonographer:    Aron Baba Sonographer#2:  Elbert Ewings Referring Phys: Cyndi Bender IMPRESSIONS  1. Left ventricular ejection fraction, by estimation, is 60 to 65%. The left ventricle has normal function. The left ventricle has no regional wall motion abnormalities. There is mild concentric left ventricular hypertrophy. Left ventricular diastolic parameters are indeterminate.  2. Right ventricular systolic function is normal. The right ventricular size is normal.  3. The mitral valve is normal in structure. No evidence of mitral valve regurgitation. No evidence of mitral stenosis.  4. The aortic valve is tricuspid. There is mild calcification of the aortic valve. There is mild thickening of the aortic valve. Aortic valve regurgitation is not visualized. Mild aortic valve stenosis.  5. The inferior vena cava is normal in size with greater than 50% respiratory variability, suggesting right atrial pressure of 3 mmHg. FINDINGS  Left  Ventricle: Left ventricular ejection fraction, by estimation, is 60 to 65%. The left ventricle has normal function. The left ventricle has no regional wall motion abnormalities. The left ventricular internal cavity size was normal in size. There is  mild concentric left ventricular hypertrophy. Left ventricular diastolic parameters are indeterminate. Right Ventricle: The right ventricular size is normal. No increase in right ventricular wall thickness. Right ventricular systolic function is normal. Left Atrium: Left atrial size was normal in size. Right Atrium: Right atrial size was normal in size. Pericardium: There is no evidence of pericardial effusion. Presence of epicardial fat layer. Mitral Valve: The mitral valve is normal in structure. Mild mitral annular calcification. No evidence of mitral valve regurgitation. No evidence of mitral valve stenosis. Tricuspid Valve: The tricuspid valve is normal in structure. Tricuspid valve regurgitation is not demonstrated. No evidence of tricuspid stenosis. Aortic Valve: The aortic valve is tricuspid. There is mild calcification of the aortic valve. There is mild thickening of the aortic valve. There is mild aortic valve annular calcification. Aortic valve regurgitation is not visualized. Mild aortic stenosis is present. Aortic valve mean gradient measures 9.0 mmHg. Aortic valve peak gradient measures 15.5 mmHg. Aortic valve area, by VTI measures 1.32 cm. Pulmonic Valve: The pulmonic valve was normal in structure. Pulmonic valve regurgitation is not visualized. No evidence of pulmonic stenosis. Aorta: The aortic root is normal in size and structure. Venous: The inferior vena cava is normal in size with greater than 50% respiratory variability, suggesting right atrial pressure of 3 mmHg. IAS/Shunts: No atrial level shunt detected by color flow Doppler.  LEFT VENTRICLE PLAX 2D LVIDd:         2.90 cm LVIDs:         2.00 cm LV PW:         1.20 cm LV IVS:        1.30 cm LVOT  diam:     1.90 cm LV SV:         37 LV SV Index:   25 LVOT Area:     2.84 cm  RIGHT VENTRICLE  IVC RV S prime:     15.60 cm/s  IVC diam: 0.70 cm TAPSE (M-mode): 2.4 cm LEFT ATRIUM             Index        RIGHT ATRIUM           Index LA diam:        2.00 cm 1.34 cm/m   RA Area:     10.20 cm LA Vol (A2C):   23.0 ml 15.42 ml/m  RA Volume:   24.10 ml  16.16 ml/m LA Vol (A4C):   14.0 ml 9.39 ml/m LA Biplane Vol: 18.5 ml 12.40 ml/m  AORTIC VALVE AV Area (Vmax):    1.42 cm AV Area (Vmean):   1.28 cm AV Area (VTI):     1.32 cm AV Vmax:           197.00 cm/s AV Vmean:          136.000 cm/s AV VTI:            0.281 m AV Peak Grad:      15.5 mmHg AV Mean Grad:      9.0 mmHg LVOT Vmax:         98.50 cm/s LVOT Vmean:        61.600 cm/s LVOT VTI:          0.131 m LVOT/AV VTI ratio: 0.47  AORTA Ao Root diam: 2.60 cm Ao Asc diam:  3.00 cm  SHUNTS Systemic VTI:  0.13 m Systemic Diam: 1.90 cm Kardie Tobb DO Electronically signed by Berniece Salines DO Signature Date/Time: 08/25/2021/12:33:20 PM    Final    DG CHEST PORT 1 VIEW  Result Date: 08/25/2021 CLINICAL DATA:  Acute left-sided chest pain. EXAM: PORTABLE CHEST 1 VIEW COMPARISON:  08/22/2021 FINDINGS: The heart size and mediastinal contours are within normal limits. Aortic atherosclerotic calcification incidentally noted. Both lungs are clear. No evidence of pneumothorax or pleural effusion. Several mildly displaced right lateral rib fractures are again noted. IMPRESSION: No active cardiopulmonary disease. Several mildly displaced right lateral rib fractures again noted. Electronically Signed   By: Marlaine Hind M.D.   On: 08/25/2021 10:39        Scheduled Meds:  acetaminophen  1,000 mg Oral Q6H   aspirin EC  81 mg Oral Daily   bisacodyl  10 mg Rectal Once   diclofenac Sodium  2 g Topical QID   docusate sodium  100 mg Oral BID   enoxaparin  40 mg Subcutaneous Q24H   fluticasone furoate-vilanterol  1 puff Inhalation Daily   And   umeclidinium  bromide  1 puff Inhalation Daily   irbesartan  150 mg Oral Daily   lidocaine  2 patch Transdermal Daily   lip balm   Topical BID   methocarbamol  500 mg Oral Q6H   metoprolol tartrate  12.5 mg Oral BID   polyethylene glycol  17 g Oral BID   simvastatin  40 mg Oral QPM   Continuous Infusions:  methocarbamol (ROBAXIN) IV     ondansetron (ZOFRAN) IV       LOS: 2 days    Time spent: 45  min  This record has been created using Systems analyst. Errors have been sought and corrected,but may not always be located. Such creation errors do not reflect on the standard of care.   Geradine Girt, DO [facility]  If 7PM-7AM, please contact night-coverage  08/26/2021, 11:43 AM

## 2021-08-27 DIAGNOSIS — S2241XA Multiple fractures of ribs, right side, initial encounter for closed fracture: Secondary | ICD-10-CM | POA: Diagnosis not present

## 2021-08-27 MED ORDER — LIDOCAINE 5 % EX PTCH: 2.0000 | MEDICATED_PATCH | Freq: Every day | CUTANEOUS | 0 refills | Status: DC

## 2021-08-27 MED ORDER — ARTIFICIAL TEARS OPHTHALMIC OINT: TOPICAL_OINTMENT | Freq: Every day | OPHTHALMIC | Status: AC | PRN

## 2021-08-27 MED ORDER — OXYCODONE HCL 5 MG PO TABS: 2.5000 mg | ORAL_TABLET | ORAL | 0 refills | Status: AC | PRN

## 2021-08-27 MED ORDER — ALBUTEROL SULFATE (2.5 MG/3ML) 0.083% IN NEBU: 2.5000 mg | INHALATION_SOLUTION | RESPIRATORY_TRACT | 12 refills | Status: DC | PRN

## 2021-08-27 MED ORDER — PANTOPRAZOLE SODIUM 40 MG PO TBEC: 40.0000 mg | DELAYED_RELEASE_TABLET | Freq: Every day | ORAL | Status: DC

## 2021-08-27 MED ORDER — BISACODYL 10 MG RE SUPP: 10.0000 mg | Freq: Two times a day (BID) | RECTAL | 0 refills | Status: AC | PRN

## 2021-08-27 MED ORDER — PANTOPRAZOLE SODIUM 40 MG PO TBEC
40.0000 mg | DELAYED_RELEASE_TABLET | Freq: Every day | ORAL | Status: DC
  Administered 2021-08-27: 40 mg via ORAL
  Filled 2021-08-27: qty 1

## 2021-08-27 MED ORDER — ONDANSETRON HCL 4 MG PO TABS: 4.0000 mg | ORAL_TABLET | Freq: Three times a day (TID) | ORAL | 0 refills | Status: DC | PRN

## 2021-08-27 MED ORDER — METOPROLOL TARTRATE 25 MG PO TABS: 25.0000 mg | ORAL_TABLET | Freq: Two times a day (BID) | ORAL | Status: DC

## 2021-08-27 MED ORDER — ONDANSETRON HCL 4 MG PO TABS: 4.0000 mg | ORAL_TABLET | Freq: Three times a day (TID) | ORAL | 0 refills | Status: DC

## 2021-08-27 MED ORDER — DICLOFENAC SODIUM 1 % EX GEL
2.0000 g | Freq: Four times a day (QID) | CUTANEOUS | Status: AC
Start: 2021-08-27 — End: ?

## 2021-08-27 MED ORDER — ACETAMINOPHEN 500 MG PO TABS: 1000.0000 mg | ORAL_TABLET | Freq: Four times a day (QID) | ORAL | 0 refills | Status: DC

## 2021-08-27 NOTE — Progress Notes (Signed)
OT Cancellation Note  Patient Details Name: Vicki Hernandez MRN: [id number] DOB: 12-Dec-1928   Cancelled Treatment:    Reason Eval/Treat Not Completed: Other (comment). Attempted to see patient for OT treatment although patient already discharged. All OT services will be met at the next venue of care.  Ailene Ravel, OTR/L,CBIS  Supplemental OT - [facility] and [facility] Dress  08/27/2021, 3:06 PM

## 2021-08-27 NOTE — Care Management Important Message (Signed)
Important Message  Patient Details  Name: Vicki Hernandez MRN: [id number] Date of Birth: Feb 24, 1928   Medicare Important Message Given:  Yes     Sherilyn Banker 08/27/2021, 2:45 PM

## 2021-08-27 NOTE — TOC Transition Note (Signed)
Transition of Care [facility]) - CM/SW Discharge Note   Patient Details  Name: [name] MRN: [id number] Date of Birth: 10-10-1928  Transition of Care [facility]) CM/SW Contact:  Deatra Robinson, LCSW Phone Number: 08/27/2021, 1:23 PM   Clinical Narrative:   Pt for dc to [facility] today. Spoke to [employer] at [facility] who confirmed they are prepared to admit pt their [employer], room 55. Packet complete including signed narcotic Rx and RN provided with number for report. Voicemail left for pt's dtr notifying her of dc. Pt with family friend at bedside. SW signing off at dc.   Dellie Burns, MSW, LCSW [phone] (coverage)      Final next level of care: Skilled Nursing Facility Barriers to Discharge: No Barriers Identified   Patient Goals and CMS Choice Patient states their goals for this hospitalization and ongoing recovery are:: Rehab CMS Medicare.gov Compare Post Acute Care list provided to:: Patient Choice offered to / list presented to : Patient  Discharge Placement                       Discharge Plan and Services In-house Referral: Clinical Social Work Discharge Planning Services: CM Consult              DME Agency: NA       HH Arranged: NA          Social Determinants of Health (SDOH) Interventions     Readmission Risk Interventions     No data to display

## 2021-08-27 NOTE — Progress Notes (Signed)
Pt discharged to [facility] this pm. Transported by [facility]tar

## 2021-08-27 NOTE — Discharge Summary (Signed)
Physician Discharge Summary  Vicki Hernandez FBP:[id number] DOB: February 22, 1928 DOA: 08/22/2021  PCP: Vicki Floro, MD  Admit date: 08/22/2021 Discharge date: 08/27/2021  Admitted From: home Discharge disposition: SNF   Recommendations for Outpatient Follow-Up:   Bowel regimen while on pain meds Zofran with meals for now-- then change to PRN Avoid narcotics Lidocaine patches and voltaren gel when patches off BMP/CBC 1 week   Discharge Diagnosis:   Principal Problem:   Multiple fractures of ribs, right side, initial encounter for closed fracture Active Problems:   Multiple rib fractures - right 7-8   Essential hypertension, benign   Chronic obstructive pulmonary disease, unspecified (HCC)   Fall    Discharge Condition: Improved.  Diet recommendation:   Regular.  Wound care: None.  Code status: Full.   History of Present Illness:   88 year old female history of COPD, hypertension, CKD stage IIIa, reflux presents to the ER today after a fall at home.  Patient was try to take the garbage bag out of the garbage can.  She lost her balance.  She struck the right side of her rib cage against the edge of the counter.  She had immediate pain.   Patient brought to ER by EMS.   Rib imaging demonstrates acute fracture of the right seventh and eighth ribs.  No pneumothorax.   EDP discussed the case with trauma surgery Dr. Michaell Cowing.  He recommended patient be transferred under the hospitalist service to [facility].  Trauma service will see her in the morning.   [facility] hospitalist contacted for admission.    Hospital Course by Problem:   Chest pain - New onset left-sided chest pain -Cardiology consult- echo done- signed off -Sublingual nitroglycerin as needed. -much improved with lidocaine patch and voltaren gel   Multiple rib fractures - right 3-11  Trauma service saw in consultation. Continue with pain control. Family requested SNF/STR   Fall Pt fell over  trying to remove garbage bag from garbage can. Did not fall to floor but right her right rib cage against counter edge. PT OT are recommending SNF.   Chronic obstructive pulmonary disease, unspecified (HCC) Stable. Not exacerbated.   Essential hypertension, benign Continue home po htn meds.      Medical Consultants:    trauma  Discharge Exam:   Vitals:   08/27/21 0907 08/27/21 0908  BP: 134/63   Pulse: 100 99  Resp: 20 16  Temp: 98.3 F (36.8 C)   SpO2: 97% 98%   Vitals:   08/26/21 1952 08/27/21 0406 08/27/21 0907 08/27/21 0908  BP: (!) 142/79 125/66 134/63   Pulse: 99 86 100 99  Resp:  16 20 16   Temp: 97.6 F (36.4 C) 98.3 F (36.8 C) 98.3 F (36.8 C)   TempSrc: Oral  Oral   SpO2: 95% 95% 97% 98%  Weight:      Height:        General exam: Appears calm and comfortable. - had BM yesterday    The results of significant diagnostics from this hospitalization (including imaging, microbiology, ancillary and laboratory) are listed below for reference.     Procedures and Diagnostic Studies:   DG Abd 2 Views  Result Date: 08/23/2021 CLINICAL DATA:  Multiple rib fractures EXAM: ABDOMEN - 2 VIEW COMPARISON:  Radiograph 08/22/2021 FINDINGS: There is gaseous distension of bowel in the lower abdomen in a nonobstructive pattern. No evidence of free intraperitoneal gas. There are minimally displaced fractures of the right lateral seventh and  eighth ribs, and possible the ninth rib. IMPRESSION: Gaseous distention of bowel in the lower abdomen in a nonobstructive pattern. Minimally displaced fracture of the right lateral seventh and eighth ribs and possibly the right ninth rib. Electronically Signed   By: Maurine Simmering M.D.   On: 08/23/2021 13:27   DG Ribs Unilateral W/Chest Right  Result Date: 08/22/2021 CLINICAL DATA:  Right rib pain EXAM: RIGHT RIBS AND CHEST - 3+ VIEW COMPARISON:  None Available. FINDINGS: There is an acute minimally displaced fracture of the right 7 and 8 ribs  laterally. Lungs are clear. No pneumothorax or pleural effusion. Cardiac size within normal limits. Pulmonary vascularity is normal. IMPRESSION: Acute minimally displaced fractures of the right seventh and eighth ribs laterally. No pneumothorax. Electronically Signed   By: Fidela Salisbury M.D.   On: 08/22/2021 22:11     Labs:   Basic Metabolic Panel: Recent Labs  Lab 08/22/21 2250 08/23/21 0540 08/24/21 0823 08/26/21 0131  NA 143 139 134* 131*  K 4.3 4.0 3.9 4.0  CL 109 104 97* 99  CO2 28 29 23 22   GLUCOSE 127* 119* 130* 113*  BUN 21 18 22  45*  CREATININE 1.12* 1.03* 1.05* 1.19*  CALCIUM 9.1 9.2 9.7 9.3   GFR Estimated Creatinine Clearance: 22.1 mL/min (A) (by C-G formula based on SCr of 1.19 mg/dL (H)). Liver Function Tests: Recent Labs  Lab 08/22/21 2250 08/24/21 0823  AST 17 21  ALT 10 12  ALKPHOS 47 57  BILITOT 0.3 0.9  PROT 6.6 7.4  ALBUMIN 3.1* 3.5   No results for input(s): "LIPASE", "AMYLASE" in the last 168 hours. No results for input(s): "AMMONIA" in the last 168 hours. Coagulation profile No results for input(s): "INR", "PROTIME" in the last 168 hours.  CBC: Recent Labs  Lab 08/22/21 2250 08/23/21 0540 08/24/21 0823 08/25/21 0646 08/26/21 0131  WBC 8.0 7.2 11.5* 14.2* 11.7*  NEUTROABS 5.7  --   --   --   --   HGB 11.4* 12.4 15.0 14.8 13.8  HCT 36.0 38.7 45.8 46.0 42.0  MCV 94.2 91.5 88.6 89.7 88.8  PLT 215 226 297 310 280   Cardiac Enzymes: No results for input(s): "CKTOTAL", "CKMB", "CKMBINDEX", "TROPONINI" in the last 168 hours. BNP: Invalid input(s): "POCBNP" CBG: No results for input(s): "GLUCAP" in the last 168 hours. D-Dimer No results for input(s): "DDIMER" in the last 72 hours. Hgb A1c No results for input(s): "HGBA1C" in the last 72 hours. Lipid Profile No results for input(s): "CHOL", "HDL", "LDLCALC", "TRIG", "CHOLHDL", "LDLDIRECT" in the last 72 hours. Thyroid function studies No results for input(s): "TSH", "T4TOTAL",  "T3FREE", "THYROIDAB" in the last 72 hours.  Invalid input(s): "FREET3" Anemia work up No results for input(s): "VITAMINB12", "FOLATE", "FERRITIN", "TIBC", "IRON", "RETICCTPCT" in the last 72 hours. Microbiology No results found for this or any previous visit (from the past 240 hour(s)).   Discharge Instructions:   Discharge Instructions     Diet general   Complete by: As directed    Increase activity slowly   Complete by: As directed       Allergies as of 08/27/2021       Reactions   Sulfa Antibiotics Itching, Hives   Codeine Nausea Only   Cymbalta [duloxetine Hcl]    Unknown reaction   Fosamax [alendronate]    Other reaction(s): upset hiatal hernia        Medication List     STOP taking these medications    Symbicort 160-4.5 MCG/ACT  inhaler Generic drug: budesonide-formoterol   SYSTANE OP       TAKE these medications    acetaminophen 500 MG tablet Commonly known as: TYLENOL Take 2 tablets (1,000 mg total) by mouth every 6 (six) hours.   albuterol (2.5 MG/3ML) 0.083% nebulizer solution Commonly known as: PROVENTIL Take 3 mLs (2.5 mg total) by nebulization every 2 (two) hours as needed for wheezing or shortness of breath.   artificial tears Oint ophthalmic ointment Commonly known as: LACRILUBE Place into both eyes daily as needed for dry eyes.   aspirin EC 81 MG tablet Take 1 tablet (81 mg total) by mouth daily.   bisacodyl 10 MG suppository Commonly known as: DULCOLAX Place 1 suppository (10 mg total) rectally every 12 (twelve) hours as needed for mild constipation or moderate constipation.   Breztri Aerosphere 160-9-4.8 MCG/ACT Aero Generic drug: Budeson-Glycopyrrol-Formoterol Inhale 2 puffs into the lungs daily as needed (for shortness of breath).   diclofenac Sodium 1 % Gel Commonly known as: VOLTAREN Apply 2 g topically 4 (four) times daily.   esomeprazole 20 MG capsule Commonly known as: NEXIUM Take 20 mg by mouth daily at 12 noon.    EYE-VITES PO Take 1 tablet by mouth daily.   lidocaine 5 % Commonly known as: LIDODERM Place 2 patches onto the skin daily. Remove & Discard patch within 12 hours or as directed by MD Start taking on: August 28, 2021   metoprolol tartrate 25 MG tablet Commonly known as: LOPRESSOR Take 1 tablet (25 mg total) by mouth 2 (two) times daily. What changed: See the new instructions.   nitroGLYCERIN 0.4 MG SL tablet Commonly known as: NITROSTAT Place 1 tablet (0.4 mg total) under the tongue every 5 (five) minutes as needed for chest pain.   ondansetron 4 MG tablet Commonly known as: Zofran Take 1 tablet (4 mg total) by mouth every 8 (eight) hours as needed for nausea or vomiting.   oxyCODONE 5 MG immediate release tablet Commonly known as: Oxy IR/ROXICODONE Take 0.5-1 tablets (2.5-5 mg total) by mouth every 4 (four) hours as needed for moderate pain or severe pain (2.5mg  moderate, 5mg  severe).   polyethylene glycol 17 g packet Commonly known as: MIRALAX / GLYCOLAX Take 17 g by mouth daily.   simvastatin 40 MG tablet Commonly known as: ZOCOR Take 1 tablet (40 mg total) by mouth every evening. What changed: when to take this   telmisartan 40 MG tablet Commonly known as: MICARDIS Take 40 mg by mouth daily.   Vitamin B12 3000 MCG Subl Take 3,000 mcg by mouth daily.   VITAMIN D (ERGOCALCIFEROL) PO Take 1 capsule by mouth every morning.          Time coordinating discharge: 45 min  Signed:  DO  [facility] 08/27/2021, 10:34 AM

## 2021-08-30 ENCOUNTER — Non-Acute Institutional Stay (SKILLED_NURSING_FACILITY): Payer: Medicare Other | Admitting: Nurse Practitioner

## 2021-08-30 DIAGNOSIS — N1831 Chronic kidney disease, stage 3a: Secondary | ICD-10-CM | POA: Diagnosis not present

## 2021-08-30 DIAGNOSIS — K5901 Slow transit constipation: Secondary | ICD-10-CM | POA: Diagnosis not present

## 2021-08-30 DIAGNOSIS — I10 Essential (primary) hypertension: Secondary | ICD-10-CM | POA: Diagnosis not present

## 2021-08-30 DIAGNOSIS — K219 Gastro-esophageal reflux disease without esophagitis: Secondary | ICD-10-CM | POA: Diagnosis not present

## 2021-08-30 DIAGNOSIS — D72829 Elevated white blood cell count, unspecified: Secondary | ICD-10-CM

## 2021-08-30 DIAGNOSIS — J449 Chronic obstructive pulmonary disease, unspecified: Secondary | ICD-10-CM

## 2021-08-30 DIAGNOSIS — S2241XD Multiple fractures of ribs, right side, subsequent encounter for fracture with routine healing: Secondary | ICD-10-CM

## 2021-08-30 NOTE — Progress Notes (Signed)
Location:   [facility] Room Number: 39 Place of Service:  SNF (31) Provider: Lennie Odor Destyn Schuyler NP  Lawerance Cruel, MD  Patient Care Team: Lawerance Cruel, MD as PCP - General (Family Medicine) Jettie Booze, MD as PCP - Cardiology (Cardiology) Jettie Booze, MD as Attending Physician (Cardiology) Melina Schools, MD as Consulting Physician (Orthopedic Surgery) Suella Broad, MD as Consulting Physician (Physical Medicine and Rehabilitation) Bo Merino, MD as Consulting Physician (Rheumatology) Gerarda Fraction, MD as Referring Physician (Ophthalmology)  Extended Emergency Contact Information Primary Emergency Contact: Pooley,George Address: [address]          [place], [place] [address] [place] Phone: [phone] Relation: Spouse Secondary Emergency Contact: Gamble,Cynthia Address: [address]          [place], [place] [address] [place] Phone: [phone] Relation: Daughter  Code Status: DNR Goals of care: Advanced Directive information    08/22/2021    8:44 PM  Advanced Directives  Does Patient Have a Medical Advance Directive? Yes  Type of Paramedic of [place];Living will;Out of facility DNR (pink MOST or yellow form)  Does patient want to make changes to medical advance directive? No - Patient declined  Pre-existing out of facility DNR order (yellow form or pink MOST form) Pink Most/Yellow Form available - Physician notified to receive inpatient order     Chief Complaint  Patient presents with  . Acute Visit    Medication review following hospital stay.     HPI:  Pt is a 88 y.o. female seen today for an acute visit for medication review following hospital stay.   Hospitalized 08/22/21-08/27/21 doe R rib fractures 7th, 8th, ?9th, oxycodone prn, lidocaine transdermal, Voltaren gel  Constipation, last BM today, takes MiraLax daily, prn Bisacodyl  GERD Zofran with meals,  takes Esomeprazole, Hgb 13.8 08/26/21  HTN, ASA, Metoprolol, Telmisartan  COPD, prn Albuterol, prn Breztri aerosphere,   CKD Bun/creat 45/1.19 08/26/21  HLD takes Simvastatin.   Leukocytosis,  wbc 11.7 08/26/21  Hyponatremia, Na 131 08/26/21, f/u BMP one week       Past Medical History:  Diagnosis Date  . Accessory kidney   . Angina    3 WEEKS  . Arthritis    OSTEO   . Atrial fibrillation ([place]) 2014  . Carotid artery bruit   . Complication of anesthesia    TAKES AWHILE TO WAKE UP   . Dysrhythmia    TACHYCARDIA   . GERD (gastroesophageal reflux disease)   . H/O hiatal hernia   . Heart murmur   . Hyperlipidemia   . Hypertension   . Macular degeneration    Past Surgical History:  Procedure Laterality Date  . ABDOMINAL HYSTERECTOMY  1974   TAH w/ BSO  . APPENDECTOMY    . CARDIOVASCULAR STRESS TEST     [place]  (NOW SEES DR Irish Lack)  . CAROTID ENDARTERECTOMY Left 03-25-11   cea  . CATARACT EXTRACTION W/ INTRAOCULAR LENS  IMPLANT, BILATERAL    . ENDARTERECTOMY  03/25/2011   Procedure: ENDARTERECTOMY CAROTID;  Surgeon: Mal Misty, MD;  Location: [facility] OR;  Service: Vascular;  Laterality: Left;  Left Carotid Endarterectomy with dacron patch angioplasty, with resection of redundant common carotid with primary reanastamosis  . EYE SURGERY    . HEMORRHOID SURGERY    . JOINT REPLACEMENT  2012   Left Knee  . KNEE SURGERY    .  SPINE SURGERY  2002, 2010   Lumbar disk/ spine surgeries X 2   By Dr. Rolena Infante  . TONSILLECTOMY      Allergies  Allergen Reactions  . Sulfa Antibiotics Itching and Hives  . Codeine Nausea Only  . Cymbalta [Duloxetine Hcl]     Unknown reaction  . Fosamax [Alendronate]     Other reaction(s): upset hiatal hernia    Allergies as of 08/30/2021       Reactions   Sulfa Antibiotics Itching, Hives   Codeine Nausea Only   Cymbalta [duloxetine Hcl]    Unknown reaction   Fosamax [alendronate]    Other reaction(s): upset hiatal hernia         Medication List        Accurate as of August 30, 2021 11:59 PM. If you have any questions, ask your nurse or doctor.          acetaminophen 500 MG tablet Commonly known as: TYLENOL Take 2 tablets (1,000 mg total) by mouth every 6 (six) hours.   albuterol (2.5 MG/3ML) 0.083% nebulizer solution Commonly known as: PROVENTIL Take 3 mLs (2.5 mg total) by nebulization every 2 (two) hours as needed for wheezing or shortness of breath.   artificial tears Oint ophthalmic ointment Commonly known as: LACRILUBE Place into both eyes daily as needed for dry eyes.   aspirin EC 81 MG tablet Take 1 tablet (81 mg total) by mouth daily.   bisacodyl 10 MG suppository Commonly known as: DULCOLAX Place 1 suppository (10 mg total) rectally every 12 (twelve) hours as needed for mild constipation or moderate constipation.   Breztri Aerosphere 160-9-4.8 MCG/ACT Aero Generic drug: Budeson-Glycopyrrol-Formoterol Inhale 2 puffs into the lungs daily as needed (for shortness of breath).   diclofenac Sodium 1 % Gel Commonly known as: VOLTAREN Apply 2 g topically 4 (four) times daily.   EYE-VITES PO Take 1 tablet by mouth daily.   lidocaine 5 % Commonly known as: LIDODERM Place 2 patches onto the skin daily. Remove & Discard patch within 12 hours or as directed by MD   metoprolol tartrate 25 MG tablet Commonly known as: LOPRESSOR Take 1 tablet (25 mg total) by mouth 2 (two) times daily.   nitroGLYCERIN 0.4 MG SL tablet Commonly known as: NITROSTAT Place 1 tablet (0.4 mg total) under the tongue every 5 (five) minutes as needed for chest pain.   ondansetron 4 MG tablet Commonly known as: Zofran Take 1 tablet (4 mg total) by mouth 3 (three) times daily with meals.   oxyCODONE 5 MG immediate release tablet Commonly known as: Oxy IR/ROXICODONE Take 0.5-1 tablets (2.5-5 mg total) by mouth every 4 (four) hours as needed for moderate pain or severe pain (2.5mg  moderate, 5mg  severe).    pantoprazole 40 MG tablet Commonly known as: PROTONIX Take 1 tablet (40 mg total) by mouth daily.   polyethylene glycol 17 g packet Commonly known as: MIRALAX / GLYCOLAX Take 17 g by mouth daily.   simvastatin 40 MG tablet Commonly known as: ZOCOR Take 1 tablet (40 mg total) by mouth every evening. What changed: when to take this   telmisartan 40 MG tablet Commonly known as: MICARDIS Take 40 mg by mouth daily.   Vitamin B12 3000 MCG Subl Take 3,000 mcg by mouth daily.   VITAMIN D (ERGOCALCIFEROL) PO Take 1 capsule by mouth every morning.        Review of Systems  Constitutional:  Positive for activity change and fatigue.  HENT:  Positive for hearing  loss. Negative for congestion and trouble swallowing.   Eyes:  Negative for visual disturbance.  Respiratory:  Negative for cough, shortness of breath and wheezing.   Cardiovascular:  Positive for chest pain and leg swelling. Negative for palpitations.  Gastrointestinal:  Negative for abdominal pain and constipation.  Genitourinary:  Negative for dysuria, frequency and urgency.  Musculoskeletal:  Positive for arthralgias and gait problem.  Skin:  Negative for color change.  Neurological:  Negative for speech difficulty, weakness and headaches.  Psychiatric/Behavioral:  Positive for sleep disturbance. Negative for confusion. The patient is not nervous/anxious.        Did not sleep well due to right chest pain     Immunization History  Administered Date(s) Administered  . Influenza Split 09/30/2010, 10/16/2013, 10/08/2016, 10/19/2020  . Influenza, High Dose Seasonal PF 10/08/2016, 09/27/2017  . Influenza,inj,quad, With Preservative 09/24/2015  . Influenza-Unspecified 09/09/2014, 10/02/2018  . Moderna Sars-Covid-2 Vaccination 01/21/2019, 02/18/2019, 12/05/2019  . [employer] 66yrs & up 10/07/2020  . Pneumococcal Conjugate-13 08/20/2013  . Pneumococcal Polysaccharide-23 11/27/2017  . Tdap  01/07/2011   Pertinent  Health Maintenance Due  Topic Date Due  . INFLUENZA VACCINE  08/17/2021  . DEXA SCAN  Completed      08/25/2021    7:35 AM 08/25/2021    9:00 PM 08/26/2021    8:15 AM 08/26/2021   10:00 PM 08/27/2021    9:00 AM  Fall Risk  Patient Fall Risk Level High fall risk High fall risk High fall risk High fall risk High fall risk   Functional Status Survey:    Vitals:   08/30/21 1236  BP: 132/70  Pulse: 62  Resp: 16  Temp: 98.6 F (37 C)  SpO2: 92%   There is no height or weight on file to calculate BMI. Physical Exam Vitals and nursing note reviewed.  Constitutional:      Appearance: Normal appearance.  HENT:     Head: Normocephalic and atraumatic.     Nose: Nose normal.     Mouth/Throat:     Mouth: Mucous membranes are moist.  Eyes:     Extraocular Movements: Extraocular movements intact.     Conjunctiva/sclera: Conjunctivae normal.     Pupils: Pupils are equal, round, and reactive to light.  Cardiovascular:     Rate and Rhythm: Normal rate and regular rhythm.     Heart sounds: Murmur heard.  Pulmonary:     Effort: Pulmonary effort is normal.     Breath sounds: No wheezing or rales.  Abdominal:     Tenderness: There is no abdominal tenderness.  Musculoskeletal:        General: Tenderness present.     Cervical back: Normal range of motion.     Right lower leg: Edema present.     Left lower leg: Edema present.     Comments: Trace edema BLE. Pain in the right chest wall with movement, deep breathing, or certain positions.   Skin:    General: Skin is warm and dry.  Neurological:     General: No focal deficit present.     Mental Status: She is alert and oriented to person, place, and time. Mental status is at baseline.     Gait: Gait abnormal.  Psychiatric:        Mood and Affect: Mood normal.        Behavior: Behavior normal.        Thought Content: Thought content normal.    Labs reviewed: Recent Labs  08/23/21 0540 08/24/21 0823  08/26/21 0131  NA 139 134* 131*  K 4.0 3.9 4.0  CL 104 97* 99  CO2 29 23 22   GLUCOSE 119* 130* 113*  BUN 18 22 45*  CREATININE 1.03* 1.05* 1.19*  CALCIUM 9.2 9.7 9.3   Recent Labs    08/22/21 2250 08/24/21 0823  AST 17 21  ALT 10 12  ALKPHOS 47 57  BILITOT 0.3 0.9  PROT 6.6 7.4  ALBUMIN 3.1* 3.5   Recent Labs    08/22/21 2250 08/23/21 0540 08/24/21 0823 08/25/21 0646 08/26/21 0131  WBC 8.0   < > 11.5* 14.2* 11.7*  NEUTROABS 5.7  --   --   --   --   HGB 11.4*   < > 15.0 14.8 13.8  HCT 36.0   < > 45.8 46.0 42.0  MCV 94.2   < > 88.6 89.7 88.8  PLT 215   < > 297 310 280   < > = values in this interval not displayed.   No results found for: "TSH" No results found for: "HGBA1C" No results found for: "CHOL", "HDL", "LDLCALC", "LDLDIRECT", "TRIG", "CHOLHDL"  Significant Diagnostic Results in last 30 days:  ECHOCARDIOGRAM COMPLETE  Result Date: 08/25/2021    ECHOCARDIOGRAM REPORT   Patient Name:   Vicki Hernandez Date of Exam: 08/25/2021 Medical Rec #:  10/25/2021          Height:       60.0 in Accession #:    [id number]         Weight:       118.0 lb Date of Birth:  [birth year]           BSA:          1.492 m Patient Age:    89 years           BP:           106/49 mmHg Patient Gender: F                  HR:           110 bpm. Exam Location:  Inpatient Procedure: 2D Echo, Cardiac Doppler and Color Doppler Indications:    Chest pain  History:        Patient has no prior history of Echocardiogram examinations.                 Arrythmias:Atrial Fibrillation and SVT; Risk                 Factors:Hypertension and Dyslipidemia. PVD.  Sonographer:    05/24/1930 Sonographer#2:  Aron Baba Referring Phys: Elbert Ewings IMPRESSIONS  1. Left ventricular ejection fraction, by estimation, is 60 to 65%. The left ventricle has normal function. The left ventricle has no regional wall motion abnormalities. There is mild concentric left ventricular hypertrophy. Left ventricular diastolic parameters are  indeterminate.  2. Right ventricular systolic function is normal. The right ventricular size is normal.  3. The mitral valve is normal in structure. No evidence of mitral valve regurgitation. No evidence of mitral stenosis.  4. The aortic valve is tricuspid. There is mild calcification of the aortic valve. There is mild thickening of the aortic valve. Aortic valve regurgitation is not visualized. Mild aortic valve stenosis.  5. The inferior vena cava is normal in size with greater than 50% respiratory variability, suggesting right atrial pressure of 3 mmHg. FINDINGS  Left Ventricle: Left ventricular ejection fraction, by estimation, is  60 to 65%. The left ventricle has normal function. The left ventricle has no regional wall motion abnormalities. The left ventricular internal cavity size was normal in size. There is  mild concentric left ventricular hypertrophy. Left ventricular diastolic parameters are indeterminate. Right Ventricle: The right ventricular size is normal. No increase in right ventricular wall thickness. Right ventricular systolic function is normal. Left Atrium: Left atrial size was normal in size. Right Atrium: Right atrial size was normal in size. Pericardium: There is no evidence of pericardial effusion. Presence of epicardial fat layer. Mitral Valve: The mitral valve is normal in structure. Mild mitral annular calcification. No evidence of mitral valve regurgitation. No evidence of mitral valve stenosis. Tricuspid Valve: The tricuspid valve is normal in structure. Tricuspid valve regurgitation is not demonstrated. No evidence of tricuspid stenosis. Aortic Valve: The aortic valve is tricuspid. There is mild calcification of the aortic valve. There is mild thickening of the aortic valve. There is mild aortic valve annular calcification. Aortic valve regurgitation is not visualized. Mild aortic stenosis is present. Aortic valve mean gradient measures 9.0 mmHg. Aortic valve peak gradient measures  15.5 mmHg. Aortic valve area, by VTI measures 1.32 cm. Pulmonic Valve: The pulmonic valve was normal in structure. Pulmonic valve regurgitation is not visualized. No evidence of pulmonic stenosis. Aorta: The aortic root is normal in size and structure. Venous: The inferior vena cava is normal in size with greater than 50% respiratory variability, suggesting right atrial pressure of 3 mmHg. IAS/Shunts: No atrial level shunt detected by color flow Doppler.  LEFT VENTRICLE PLAX 2D LVIDd:         2.90 cm LVIDs:         2.00 cm LV PW:         1.20 cm LV IVS:        1.30 cm LVOT diam:     1.90 cm LV SV:         37 LV SV Index:   25 LVOT Area:     2.84 cm  RIGHT VENTRICLE             IVC RV S prime:     15.60 cm/s  IVC diam: 0.70 cm TAPSE (M-mode): 2.4 cm LEFT ATRIUM             Index        RIGHT ATRIUM           Index LA diam:        2.00 cm 1.34 cm/m   RA Area:     10.20 cm LA Vol (A2C):   23.0 ml 15.42 ml/m  RA Volume:   24.10 ml  16.16 ml/m LA Vol (A4C):   14.0 ml 9.39 ml/m LA Biplane Vol: 18.5 ml 12.40 ml/m  AORTIC VALVE AV Area (Vmax):    1.42 cm AV Area (Vmean):   1.28 cm AV Area (VTI):     1.32 cm AV Vmax:           197.00 cm/s AV Vmean:          136.000 cm/s AV VTI:            0.281 m AV Peak Grad:      15.5 mmHg AV Mean Grad:      9.0 mmHg LVOT Vmax:         98.50 cm/s LVOT Vmean:        61.600 cm/s LVOT VTI:          0.131 m  LVOT/AV VTI ratio: 0.47  AORTA Ao Root diam: 2.60 cm Ao Asc diam:  3.00 cm  SHUNTS Systemic VTI:  0.13 m Systemic Diam: 1.90 cm Kardie Tobb DO Electronically signed by Berniece Salines DO Signature Date/Time: 08/25/2021/12:33:20 PM    Final    DG CHEST PORT 1 VIEW  Result Date: 08/25/2021 CLINICAL DATA:  Acute left-sided chest pain. EXAM: PORTABLE CHEST 1 VIEW COMPARISON:  08/22/2021 FINDINGS: The heart size and mediastinal contours are within normal limits. Aortic atherosclerotic calcification incidentally noted. Both lungs are clear. No evidence of pneumothorax or pleural effusion.  Several mildly displaced right lateral rib fractures are again noted. IMPRESSION: No active cardiopulmonary disease. Several mildly displaced right lateral rib fractures again noted. Electronically Signed   By: Marlaine Hind M.D.   On: 08/25/2021 10:39   CT CHEST ABDOMEN PELVIS W CONTRAST  Result Date: 08/24/2021 CLINICAL DATA:  88 year old female status post recent fall with right side rib fractures. EXAM: CT CHEST, ABDOMEN, AND PELVIS WITH CONTRAST TECHNIQUE: Multidetector CT imaging of the chest, abdomen and pelvis was performed following the standard protocol during bolus administration of intravenous contrast. RADIATION DOSE REDUCTION: This exam was performed according to the departmental dose-optimization program which includes automated exposure control, adjustment of the mA and/or kV according to patient size and/or use of iterative reconstruction technique. CONTRAST:  42mL OMNIPAQUE IOHEXOL 300 MG/ML  SOLN COMPARISON:  Right rib series 08/22/2021. FINDINGS: CT CHEST FINDINGS Cardiovascular: Calcified coronary artery atherosclerosis. Calcified aortic atherosclerosis. Mildly tortuous thoracic aorta. No cardiomegaly or pericardial effusion. Major mediastinal vascular structures appear intact. Mediastinum/Nodes: Negative. No mediastinal hematoma, lymphadenopathy, mass. Lungs/Pleura: Trace layering right pleural effusion. No pneumothorax. Major airways are patent. Mild perihilar bronchiectasis. Mild bilateral lung scarring at the lung apices and lung bases. No consolidation or convincing pulmonary contusion. Musculoskeletal: Osteopenia. Lateral and posterior right 7th through 11th rib fractures. Seventh 8th and 9th posterolateral fractures are all mildly displaced. Superimposed nondisplaced fracture of the anterior right 7th rib on series 4, image 131. And there could also be nondisplaced right 3rd through 6th anterior rib fractures. No sternal fracture. Visible shoulder osseous structures appear intact. Age  indeterminate mild T7 compression fracture. Minor age indeterminate compression of the T4 superior endplate. CT ABDOMEN PELVIS FINDINGS Hepatobiliary: No discrete liver injury. But there is a small volume of free fluid in the gallbladder fossa (coronal image 44). Gallbladder is mildly distended the wall thickness remains normal and there is no other pericholecystic inflammation. Bile ducts are within normal limits for age. Pancreas: Negative except for some atrophy. Spleen: No splenic injury or perisplenic fluid. Adrenals/Urinary Tract: Adrenal glands and kidneys are within normal limits for age. Several small benign appearing renal cysts (no follow-up imaging recommended). Unremarkable urinary bladder. Stomach/Bowel: Decompressed rectum. Redundant gas distended sigmoid colon. But decompressed upstream sigmoid and descending colon. Small volume of simple density free fluid in the left gutter on series 3, image 72. And there is mild to moderate regional descending colon bowel wall thickening (series 3, image 81) which is fairly circumferential but along a short segment of only 4-5 cm. Upstream colon is nondilated although there is abundant retained stool in redundant transverse colon. Negative right colon. No right gutter fluid. No pneumoperitoneum identified. No dilated small bowel. Small volume fluid in the stomach. Duodenum is decompressed. Vascular/Lymphatic: Infrarenal abdominal aortic aneurysm measures up to 4.4 cm diameter with abundant mural plaque or thrombus (series 3, image 74). Underlying Calcified aortic atherosclerosis. Major arterial structures including the IMA remain patent. Iliofemoral  calcified atherosclerosis. Portal venous system appears to be patent. No lymphadenopathy. Reproductive: Surgically absent uterus. Diminutive or absent ovaries. Other: Scattered small volume of free fluid layering in the pelvis (series 3, image 103). Musculoskeletal: Osteopenia. Lumbar vertebrae appear intact with mild  degenerative spondylolisthesis L3-L4 and L4-L5. Sacrum, SI joints, pelvis and proximal femurs appear intact. Trace ventral right abdominal wall soft tissue gas appears related to subcutaneous injection on series 3, image 90. And there is similar trace gas within the atrophied left abdominal wall muscle on image 91. IMPRESSION: 1. Osteopenia with definite post-traumatic fractures of right ribs 7 through 11 (7th rib fractured in two places). Possible nondisplaced right 3rd through 6th rib fractures also. Trace right pleural effusion. No pneumothorax or pulmonary contusion. 2. Abnormal but nonspecific free fluid in the left gutter and pelvis. Bowel Injury is not excluded, and there is abnormal mural thickening of the descending colon in a segment of 4-5 cm. Differential considerations include Adenocarcinoma and focal Colitis. 3. No other acute traumatic injury identified; age indeterminate mild T4 and T7 compression fractures. 4. 4.4 cm infrarenal abdominal aortic aneurysm. Recommend follow-up every 12 months and Vascular Consultation. Aortic aneurysm NOS (ICD10-I71.9). Aortic Atherosclerosis (ICD10-I70.0). Reference: J Am Coll Radiol [id number]. Electronically Signed   By: Genevie Ann M.D.   On: 08/24/2021 09:28   DG Abd 2 Views  Result Date: 08/23/2021 CLINICAL DATA:  Multiple rib fractures EXAM: ABDOMEN - 2 VIEW COMPARISON:  Radiograph 08/22/2021 FINDINGS: There is gaseous distension of bowel in the lower abdomen in a nonobstructive pattern. No evidence of free intraperitoneal gas. There are minimally displaced fractures of the right lateral seventh and eighth ribs, and possible the ninth rib. IMPRESSION: Gaseous distention of bowel in the lower abdomen in a nonobstructive pattern. Minimally displaced fracture of the right lateral seventh and eighth ribs and possibly the right ninth rib. Electronically Signed   By: Maurine Simmering M.D.   On: 08/23/2021 13:27   DG Ribs Unilateral W/Chest Right  Result Date:  08/22/2021 CLINICAL DATA:  Right rib pain EXAM: RIGHT RIBS AND CHEST - 3+ VIEW COMPARISON:  None Available. FINDINGS: There is an acute minimally displaced fracture of the right 7 and 8 ribs laterally. Lungs are clear. No pneumothorax or pleural effusion. Cardiac size within normal limits. Pulmonary vascularity is normal. IMPRESSION: Acute minimally displaced fractures of the right seventh and eighth ribs laterally. No pneumothorax. Electronically Signed   By: Fidela Salisbury M.D.   On: 08/22/2021 22:11    Assessment/Plan: Multiple rib fractures - right 7-8 Hospitalized 08/22/21-08/27/21 doe R rib fractures 7th, 8th, ?9th, oxycodone prn, lidocaine transdermal, Voltaren gel Control pain, working with therapy in [facility], her goal of care is to return to [facility]  Slow transit constipation last BM today, takes MiraLax daily, prn Bisacodyl  Gastroesophageal reflux disease Zofran with meals, takes Esomeprazole, Hgb 13.8 08/26/21  Hypertension Blood pressure is controlled, continue  ASA, Metoprolol, Telmisartan  Chronic obstructive pulmonary disease, unspecified (HCC) Stable,  prn Albuterol, prn Breztri aerosphere,   Chronic kidney disease, stage 3 unspecified ([place]) Bun/creat 45/1.19 08/26/21  Leukocytosis wbc 11.7 08/26/21, repeat CBC one week.     Family/ staff Communication: plan of care reviewed with the patient and charge nurse.   Labs/tests ordered:  pending CBC, BMP one week.   Time spend 35 minutes.

## 2021-08-31 ENCOUNTER — Encounter: Payer: Self-pay | Admitting: Internal Medicine

## 2021-08-31 ENCOUNTER — Non-Acute Institutional Stay (SKILLED_NURSING_FACILITY): Payer: Medicare Other | Admitting: Internal Medicine

## 2021-08-31 ENCOUNTER — Encounter: Payer: Self-pay | Admitting: Nurse Practitioner

## 2021-08-31 DIAGNOSIS — W19XXXS Unspecified fall, sequela: Secondary | ICD-10-CM | POA: Diagnosis not present

## 2021-08-31 DIAGNOSIS — E785 Hyperlipidemia, unspecified: Secondary | ICD-10-CM

## 2021-08-31 DIAGNOSIS — R1312 Dysphagia, oropharyngeal phase: Secondary | ICD-10-CM | POA: Diagnosis not present

## 2021-08-31 DIAGNOSIS — K5900 Constipation, unspecified: Secondary | ICD-10-CM | POA: Diagnosis not present

## 2021-08-31 DIAGNOSIS — K639 Disease of intestine, unspecified: Secondary | ICD-10-CM | POA: Diagnosis not present

## 2021-08-31 DIAGNOSIS — D72829 Elevated white blood cell count, unspecified: Secondary | ICD-10-CM | POA: Insufficient documentation

## 2021-08-31 DIAGNOSIS — R112 Nausea with vomiting, unspecified: Secondary | ICD-10-CM

## 2021-08-31 DIAGNOSIS — K219 Gastro-esophageal reflux disease without esophagitis: Secondary | ICD-10-CM | POA: Diagnosis not present

## 2021-08-31 DIAGNOSIS — J449 Chronic obstructive pulmonary disease, unspecified: Secondary | ICD-10-CM

## 2021-08-31 DIAGNOSIS — S2241XA Multiple fractures of ribs, right side, initial encounter for closed fracture: Secondary | ICD-10-CM

## 2021-08-31 DIAGNOSIS — K5901 Slow transit constipation: Secondary | ICD-10-CM | POA: Insufficient documentation

## 2021-08-31 NOTE — Assessment & Plan Note (Signed)
Blood pressure is controlled, continue  ASA, Metoprolol, Telmisartan

## 2021-08-31 NOTE — Assessment & Plan Note (Signed)
Bun/creat 45/1.19 08/26/21 

## 2021-08-31 NOTE — Assessment & Plan Note (Signed)
Stable,  prn Albuterol, prn [employer],

## 2021-08-31 NOTE — Assessment & Plan Note (Signed)
wbc 11.7 08/26/21, repeat CBC one week.

## 2021-08-31 NOTE — Assessment & Plan Note (Signed)
last BM today, takes MiraLax daily, prn Bisacodyl

## 2021-08-31 NOTE — Progress Notes (Unsigned)
Provider:  Einar Crow MD Location:   [facility] Room Number: 82 Place of Service:  SNF (31)  PCP: Daisy Floro, MD Patient Care Team: Daisy Floro, MD as PCP - General (Family Medicine) Vicki Crafts, MD as PCP - Cardiology (Cardiology) Vicki Crafts, MD as Attending Physician (Cardiology) Venita Lick, MD as Consulting Physician (Orthopedic Surgery) Sheran Luz, MD as Consulting Physician (Physical Medicine and Rehabilitation) Pollyann Savoy, MD as Consulting Physician (Rheumatology) Marvis Repress, MD as Referring Physician (Ophthalmology)  Extended Emergency Contact Information Primary Emergency Contact: Francisco,George Address: [address]          [place], [place] [address] [place] Home Phone: [phone] Relation: Spouse Secondary Emergency Contact: Gamble,Cynthia Address: [address]          [place], [place] [address] [place] Home Phone: [phone] Relation: Daughter  Code Status: DNR Managed Care Goals of Care: Advanced Directive information    08/22/2021    8:44 PM  Advanced Directives  Does Patient Have a Medical Advance Directive? Yes  Type of Estate agent of [place];Living will;Out of facility DNR (pink MOST or yellow form)  Does patient want to make changes to medical advance directive? No - Patient declined  Pre-existing out of facility DNR order (yellow form or pink MOST form) Pink Most/Yellow Form available - Physician notified to receive inpatient order      Chief Complaint  Patient presents with   New Admit To SNF    Admission to SNF    HPI: Patient is a 88 y.o. female seen today for admission to SNF  Patient patient was admitted in the hospital from 8/6 to 8/11 for multiple fractures of ribs after a fall Patient has a history of COPD, hypertension, CKD, GERD Patient lives [place] her [place] in [place] in [facility]   she fell while taking  the garbage bag out of her garbage can.  She hit the right side of her rib cage had immediate pain and was taken to ED the  CT scan has shown that she has multiple rib fractures from 3rd-11th  she was admitted for observation no pneumothorax She had left-sided chest pain also an echo was done which did not show any thing acute  SNF patient continues to have pain but is responding to oxycodone  Her other acute issue is nausea and vomiting she was given some Zofran in the hospital. She states that the food sometimes gets stuck in her throat and then she throws up It has been going on before this fall but has gotten worse recently.  Denies any problem with abdominal pain.  No blood in stool bowels are moving well She did have a incidental finding on her CT scan of abnormal mural thickening of the descending colon in a segment of 4-5 cm which Needs further work up  Usually does not use any cane or walker but uses the power chair to for long distance.  Husband states that she has fallen 2-3 times since Christmas  Past Medical History:  Diagnosis Date   Accessory kidney    Angina    3 WEEKS   Arthritis    OSTEO    Atrial fibrillation (HCC) 2014   Carotid artery bruit    Complication of anesthesia    TAKES AWHILE TO WAKE UP    Dysrhythmia    TACHYCARDIA    GERD (gastroesophageal reflux disease)    H/O hiatal hernia  Heart murmur    Hyperlipidemia    Hypertension    Macular degeneration    Past Surgical History:  Procedure Laterality Date   ABDOMINAL HYSTERECTOMY  1974   TAH w/ BSO   APPENDECTOMY     CARDIOVASCULAR STRESS TEST     5 YRS AGO     DR H SMITH  (NOW SEES DR Eldridge Dace)   CAROTID ENDARTERECTOMY Left 03-25-11   cea   CATARACT EXTRACTION W/ INTRAOCULAR LENS  IMPLANT, BILATERAL     ENDARTERECTOMY  03/25/2011   Procedure: ENDARTERECTOMY CAROTID;  Surgeon: Pryor Ochoa, MD;  Location: [facility] OR;  Service: Vascular;  Laterality: Left;  Left Carotid Endarterectomy with dacron  patch angioplasty, with resection of redundant common carotid with primary reanastamosis   EYE SURGERY     HEMORRHOID SURGERY     JOINT REPLACEMENT  2012   Left Knee   KNEE SURGERY     SPINE SURGERY  2002, 2010   Lumbar disk/ spine surgeries X 2   By Dr. Shon Baton   TONSILLECTOMY      reports that she quit smoking about 5 years ago. Her smoking use included cigarettes. She smoked an average of .5 packs per day. She has never used smokeless tobacco. She reports current alcohol use. She reports that she does not use drugs. Social History   Socioeconomic History   Marital status: Married    Spouse name: Not on file   Number of children: Not on file   Years of education: Not on file   Highest education level: Not on file  Occupational History   Not on file  Tobacco Use   Smoking status: Former    Packs/day: 0.50    Types: Cigarettes    Quit date: 12/28/2015    Years since quitting: 5.6   Smokeless tobacco: Never  Vaping Use   Vaping Use: Never used  Substance and Sexual Activity   Alcohol use: Yes    Comment: social   Drug use: No   Sexual activity: Not on file  Other Topics Concern   Not on file  Social History Narrative   Not on file   Social Determinants of Health   Financial Resource Strain: Not on file  Food Insecurity: Not on file  Transportation Needs: Not on file  Physical Activity: Not on file  Stress: Not on file  Social Connections: Not on file  Intimate Partner Violence: Not on file    Functional Status Survey:    Family History  Problem Relation Age of Onset   Hyperlipidemia Father    Hypertension Father    Heart disease Father        Heart Disease before age 65-  Bleeding problems   Heart attack Father    Stroke Mother    Heart disease Mother        After age 56    Health Maintenance  Topic Date Due   Zoster Vaccines- Shingrix (1 of 2) Never done   COVID-19 Vaccine (5 - Moderna risk series) 12/02/2020   TETANUS/TDAP  01/06/2021   INFLUENZA  VACCINE  08/17/2021   Pneumonia Vaccine 48+ Years old  Completed   DEXA SCAN  Completed   HPV VACCINES  Aged Out    Allergies  Allergen Reactions   Sulfa Antibiotics Itching and Hives   Codeine Nausea Only   Cymbalta [Duloxetine Hcl]     Unknown reaction   Fosamax [Alendronate]     Other reaction(s): upset hiatal hernia  Allergies as of 08/31/2021       Reactions   Sulfa Antibiotics Itching, Hives   Codeine Nausea Only   Cymbalta [duloxetine Hcl]    Unknown reaction   Fosamax [alendronate]    Other reaction(s): upset hiatal hernia        Medication List        Accurate as of August 31, 2021 12:10 PM. If you have any questions, ask your nurse or doctor.          STOP taking these medications    pantoprazole 40 MG tablet Commonly known as: PROTONIX Stopped by: Virgie Dad, MD       TAKE these medications    acetaminophen 500 MG tablet Commonly known as: TYLENOL Take 500 mg by mouth every 8 (eight) hours as needed. What changed: Another medication with the same name was removed. Continue taking this medication, and follow the directions you see here. Changed by: Virgie Dad, MD   albuterol (2.5 MG/3ML) 0.083% nebulizer solution Commonly known as: PROVENTIL Take 3 mLs (2.5 mg total) by nebulization every 2 (two) hours as needed for wheezing or shortness of breath.   artificial tears Oint ophthalmic ointment Commonly known as: LACRILUBE Place into both eyes daily as needed for dry eyes.   aspirin EC 81 MG tablet Take 1 tablet (81 mg total) by mouth daily.   bisacodyl 10 MG suppository Commonly known as: DULCOLAX Place 1 suppository (10 mg total) rectally every 12 (twelve) hours as needed for mild constipation or moderate constipation.   Breztri Aerosphere 160-9-4.8 MCG/ACT Aero Generic drug: Budeson-Glycopyrrol-Formoterol Inhale 2 puffs into the lungs daily as needed (for shortness of breath).   diclofenac Sodium 1 % Gel Commonly known as:  VOLTAREN Apply 2 g topically 4 (four) times daily.   esomeprazole 20 MG capsule Commonly known as: NEXIUM Take 20 mg by mouth daily at 12 noon.   EYE-VITES PO Take 1 tablet by mouth daily.   lidocaine 5 % Commonly known as: LIDODERM Place 2 patches onto the skin daily. Remove & Discard patch within 12 hours or as directed by MD   metoprolol tartrate 25 MG tablet Commonly known as: LOPRESSOR Take 1 tablet (25 mg total) by mouth 2 (two) times daily.   nitroGLYCERIN 0.4 MG SL tablet Commonly known as: NITROSTAT Place 1 tablet (0.4 mg total) under the tongue every 5 (five) minutes as needed for chest pain.   ondansetron 4 MG tablet Commonly known as: Zofran Take 1 tablet (4 mg total) by mouth 3 (three) times daily with meals.   oxyCODONE 5 MG immediate release tablet Commonly known as: Oxy IR/ROXICODONE Take 0.5-1 tablets (2.5-5 mg total) by mouth every 4 (four) hours as needed for moderate pain or severe pain (2.5mg  moderate, 5mg  severe).   polyethylene glycol 17 g packet Commonly known as: MIRALAX / GLYCOLAX Take 17 g by mouth daily.   simvastatin 40 MG tablet Commonly known as: ZOCOR Take 1 tablet (40 mg total) by mouth every evening.   telmisartan 40 MG tablet Commonly known as: MICARDIS Take 40 mg by mouth daily.   Vitamin B12 3000 MCG Subl Take 3,000 mcg by mouth daily.   VITAMIN D (ERGOCALCIFEROL) PO Take 1 capsule by mouth every morning.        Review of Systems  Constitutional:  Positive for activity change. Negative for appetite change.  HENT: Negative.    Respiratory:  Negative for cough and shortness of breath.   Cardiovascular:  Positive for  chest pain. Negative for leg swelling.  Gastrointestinal:  Positive for constipation and nausea.  Genitourinary: Negative.   Musculoskeletal:  Positive for gait problem. Negative for arthralgias and myalgias.  Skin: Negative.   Neurological:  Negative for dizziness and weakness.  Psychiatric/Behavioral:   Negative for confusion, dysphoric mood and sleep disturbance.     Vitals:   08/31/21 1152  BP: 124/60  Pulse: 62  Resp: 18  Temp: 97.6 F (36.4 C)  SpO2: 92%  Weight: 118 lb (53.5 kg)  Height: 5' (1.524 m)   Body mass index is 23.05 kg/m. Physical Exam Vitals reviewed.  Constitutional:      Appearance: Normal appearance.  HENT:     Head: Normocephalic.     Nose: Nose normal.     Mouth/Throat:     Mouth: Mucous membranes are moist.     Pharynx: Oropharynx is clear.  Eyes:     Pupils: Pupils are equal, round, and reactive to light.  Cardiovascular:     Rate and Rhythm: Normal rate and regular rhythm.     Pulses: Normal pulses.     Heart sounds: Normal heart sounds. No murmur heard. Pulmonary:     Effort: Pulmonary effort is normal.     Breath sounds: Normal breath sounds.  Abdominal:     General: Abdomen is flat. Bowel sounds are normal.     Palpations: Abdomen is soft.  Musculoskeletal:        General: No swelling.     Cervical back: Neck supple.  Skin:    General: Skin is warm.  Neurological:     General: No focal deficit present.     Mental Status: She is alert and oriented to person, place, and time.  Psychiatric:        Mood and Affect: Mood normal.        Thought Content: Thought content normal.     Labs reviewed: Basic Metabolic Panel: Recent Labs    08/23/21 0540 08/24/21 0823 08/26/21 0131  NA 139 134* 131*  K 4.0 3.9 4.0  CL 104 97* 99  CO2 29 23 22   GLUCOSE 119* 130* 113*  BUN 18 22 45*  CREATININE 1.03* 1.05* 1.19*  CALCIUM 9.2 9.7 9.3   Liver Function Tests: Recent Labs    08/22/21 2250 08/24/21 0823  AST 17 21  ALT 10 12  ALKPHOS 47 57  BILITOT 0.3 0.9  PROT 6.6 7.4  ALBUMIN 3.1* 3.5   No results for input(s): "LIPASE", "AMYLASE" in the last 8760 hours. No results for input(s): "AMMONIA" in the last 8760 hours. CBC: Recent Labs    08/22/21 2250 08/23/21 0540 08/24/21 0823 08/25/21 0646 08/26/21 0131  WBC 8.0   < >  11.5* 14.2* 11.7*  NEUTROABS 5.7  --   --   --   --   HGB 11.4*   < > 15.0 14.8 13.8  HCT 36.0   < > 45.8 46.0 42.0  MCV 94.2   < > 88.6 89.7 88.8  PLT 215   < > 297 310 280   < > = values in this interval not displayed.   Cardiac Enzymes: No results for input(s): "CKTOTAL", "CKMB", "CKMBINDEX", "TROPONINI" in the last 8760 hours. BNP: Invalid input(s): "POCBNP" No results found for: "HGBA1C" No results found for: "TSH" Lab Results  Component Value Date   VITAMINB12 461 03/18/2020   No results found for: "FOLATE" No results found for: "IRON", "TIBC", "FERRITIN"  Imaging and Procedures obtained prior to SNF  admission: DG Abd 2 Views  Result Date: 08/23/2021 CLINICAL DATA:  Multiple rib fractures EXAM: ABDOMEN - 2 VIEW COMPARISON:  Radiograph 08/22/2021 FINDINGS: There is gaseous distension of bowel in the lower abdomen in a nonobstructive pattern. No evidence of free intraperitoneal gas. There are minimally displaced fractures of the right lateral seventh and eighth ribs, and possible the ninth rib. IMPRESSION: Gaseous distention of bowel in the lower abdomen in a nonobstructive pattern. Minimally displaced fracture of the right lateral seventh and eighth ribs and possibly the right ninth rib. Electronically Signed   By: Caprice Renshaw M.D.   On: 08/23/2021 13:27   DG Ribs Unilateral W/Chest Right  Result Date: 08/22/2021 CLINICAL DATA:  Right rib pain EXAM: RIGHT RIBS AND CHEST - 3+ VIEW COMPARISON:  None Available. FINDINGS: There is an acute minimally displaced fracture of the right 7 and 8 ribs laterally. Lungs are clear. No pneumothorax or pleural effusion. Cardiac size within normal limits. Pulmonary vascularity is normal. IMPRESSION: Acute minimally displaced fractures of the right seventh and eighth ribs laterally. No pneumothorax. Electronically Signed   By: Helyn Numbers M.D.   On: 08/22/2021 22:11    Assessment/Plan 1. Multiple fractures of ribs, right side, initial encounter  for closed fracture Schedeule Tylenol to help with Pain  Already on Oxycodone Incentive Spirometry  2. Oropharyngeal dysphagia Speech Eval Mechanical Soft diet for now   3. Nausea and vomiting, unspecified vomiting type On zofran with meals  Possible GI referal if no improvement  4. Fall, sequela Therapy and use walker  5. Colonic thickening Needs follow up as outpatient 6. Chronic obstructive pulmonary disease, unspecified COPD type (HCC) On Breztri  7. Gastroesophageal reflux disease without esophagitis Change Nexium to BID to see if it helps with her Symptoms 8. Hyperlipidemia, unspecified hyperlipidemia type On Statin  9. Constipation, unspecified constipation type On Miralax Bowels moving well    Family/ staff Communication:   Labs/tests ordered: CBC,CMP in 1 week

## 2021-08-31 NOTE — Assessment & Plan Note (Signed)
Hospitalized 08/22/21-08/27/21 doe R rib fractures 7th, 8th, ?9th, oxycodone prn, lidocaine transdermal, Voltaren gel Control pain, working with therapy in SNF [facility], her goal of care is to return to [place] [facility]

## 2021-08-31 NOTE — Assessment & Plan Note (Signed)
Zofran with meals, takes Esomeprazole, Hgb 13.8 08/26/21

## 2021-09-09 ENCOUNTER — Other Ambulatory Visit: Payer: Self-pay | Admitting: Nurse Practitioner

## 2021-09-09 ENCOUNTER — Other Ambulatory Visit (HOSPITAL_COMMUNITY): Payer: Self-pay | Admitting: Nurse Practitioner

## 2021-09-09 DIAGNOSIS — R4702 Dysphasia: Secondary | ICD-10-CM

## 2021-09-12 DIAGNOSIS — S2249XD Multiple fractures of ribs, unspecified side, subsequent encounter for fracture with routine healing: Secondary | ICD-10-CM | POA: Diagnosis not present

## 2021-09-12 DIAGNOSIS — W19XXXD Unspecified fall, subsequent encounter: Secondary | ICD-10-CM | POA: Diagnosis not present

## 2021-09-12 DIAGNOSIS — M792 Neuralgia and neuritis, unspecified: Secondary | ICD-10-CM | POA: Diagnosis not present

## 2021-09-12 DIAGNOSIS — D72829 Elevated white blood cell count, unspecified: Secondary | ICD-10-CM | POA: Diagnosis not present

## 2021-09-12 DIAGNOSIS — R2689 Other abnormalities of gait and mobility: Secondary | ICD-10-CM | POA: Diagnosis not present

## 2021-09-12 DIAGNOSIS — M109 Gout, unspecified: Secondary | ICD-10-CM | POA: Diagnosis not present

## 2021-09-12 DIAGNOSIS — S2241XD Multiple fractures of ribs, right side, subsequent encounter for fracture with routine healing: Secondary | ICD-10-CM | POA: Diagnosis not present

## 2021-09-12 DIAGNOSIS — R1312 Dysphagia, oropharyngeal phase: Secondary | ICD-10-CM | POA: Diagnosis not present

## 2021-09-12 DIAGNOSIS — R29898 Other symptoms and signs involving the musculoskeletal system: Secondary | ICD-10-CM | POA: Diagnosis not present

## 2021-09-12 DIAGNOSIS — I700 Atherosclerosis of aorta: Secondary | ICD-10-CM | POA: Diagnosis not present

## 2021-09-13 DIAGNOSIS — M792 Neuralgia and neuritis, unspecified: Secondary | ICD-10-CM | POA: Diagnosis not present

## 2021-09-13 DIAGNOSIS — D72829 Elevated white blood cell count, unspecified: Secondary | ICD-10-CM | POA: Diagnosis not present

## 2021-09-13 DIAGNOSIS — M109 Gout, unspecified: Secondary | ICD-10-CM | POA: Diagnosis not present

## 2021-09-13 DIAGNOSIS — I700 Atherosclerosis of aorta: Secondary | ICD-10-CM | POA: Diagnosis not present

## 2021-09-13 DIAGNOSIS — S2249XD Multiple fractures of ribs, unspecified side, subsequent encounter for fracture with routine healing: Secondary | ICD-10-CM | POA: Diagnosis not present

## 2021-09-13 DIAGNOSIS — R29898 Other symptoms and signs involving the musculoskeletal system: Secondary | ICD-10-CM | POA: Diagnosis not present

## 2021-09-13 DIAGNOSIS — R1312 Dysphagia, oropharyngeal phase: Secondary | ICD-10-CM | POA: Diagnosis not present

## 2021-09-13 DIAGNOSIS — R2689 Other abnormalities of gait and mobility: Secondary | ICD-10-CM | POA: Diagnosis not present

## 2021-09-13 DIAGNOSIS — S2241XD Multiple fractures of ribs, right side, subsequent encounter for fracture with routine healing: Secondary | ICD-10-CM | POA: Diagnosis not present

## 2021-09-13 DIAGNOSIS — W19XXXD Unspecified fall, subsequent encounter: Secondary | ICD-10-CM | POA: Diagnosis not present

## 2021-09-14 ENCOUNTER — Other Ambulatory Visit (HOSPITAL_COMMUNITY): Payer: Medicare Other

## 2021-09-14 DIAGNOSIS — M109 Gout, unspecified: Secondary | ICD-10-CM | POA: Diagnosis not present

## 2021-09-14 DIAGNOSIS — M792 Neuralgia and neuritis, unspecified: Secondary | ICD-10-CM | POA: Diagnosis not present

## 2021-09-14 DIAGNOSIS — S2249XD Multiple fractures of ribs, unspecified side, subsequent encounter for fracture with routine healing: Secondary | ICD-10-CM | POA: Diagnosis not present

## 2021-09-14 DIAGNOSIS — R2689 Other abnormalities of gait and mobility: Secondary | ICD-10-CM | POA: Diagnosis not present

## 2021-09-14 DIAGNOSIS — R1312 Dysphagia, oropharyngeal phase: Secondary | ICD-10-CM | POA: Diagnosis not present

## 2021-09-14 DIAGNOSIS — W19XXXD Unspecified fall, subsequent encounter: Secondary | ICD-10-CM | POA: Diagnosis not present

## 2021-09-14 DIAGNOSIS — I700 Atherosclerosis of aorta: Secondary | ICD-10-CM | POA: Diagnosis not present

## 2021-09-14 DIAGNOSIS — R29898 Other symptoms and signs involving the musculoskeletal system: Secondary | ICD-10-CM | POA: Diagnosis not present

## 2021-09-14 DIAGNOSIS — D72829 Elevated white blood cell count, unspecified: Secondary | ICD-10-CM | POA: Diagnosis not present

## 2021-09-14 DIAGNOSIS — S2241XD Multiple fractures of ribs, right side, subsequent encounter for fracture with routine healing: Secondary | ICD-10-CM | POA: Diagnosis not present

## 2021-09-15 DIAGNOSIS — W19XXXD Unspecified fall, subsequent encounter: Secondary | ICD-10-CM | POA: Diagnosis not present

## 2021-09-15 DIAGNOSIS — M792 Neuralgia and neuritis, unspecified: Secondary | ICD-10-CM | POA: Diagnosis not present

## 2021-09-15 DIAGNOSIS — R2689 Other abnormalities of gait and mobility: Secondary | ICD-10-CM | POA: Diagnosis not present

## 2021-09-15 DIAGNOSIS — R1312 Dysphagia, oropharyngeal phase: Secondary | ICD-10-CM | POA: Diagnosis not present

## 2021-09-15 DIAGNOSIS — M109 Gout, unspecified: Secondary | ICD-10-CM | POA: Diagnosis not present

## 2021-09-15 DIAGNOSIS — S2241XD Multiple fractures of ribs, right side, subsequent encounter for fracture with routine healing: Secondary | ICD-10-CM | POA: Diagnosis not present

## 2021-09-15 DIAGNOSIS — I700 Atherosclerosis of aorta: Secondary | ICD-10-CM | POA: Diagnosis not present

## 2021-09-15 DIAGNOSIS — D72829 Elevated white blood cell count, unspecified: Secondary | ICD-10-CM | POA: Diagnosis not present

## 2021-09-15 DIAGNOSIS — S2249XD Multiple fractures of ribs, unspecified side, subsequent encounter for fracture with routine healing: Secondary | ICD-10-CM | POA: Diagnosis not present

## 2021-09-15 DIAGNOSIS — R29898 Other symptoms and signs involving the musculoskeletal system: Secondary | ICD-10-CM | POA: Diagnosis not present

## 2021-09-16 DIAGNOSIS — R1312 Dysphagia, oropharyngeal phase: Secondary | ICD-10-CM | POA: Diagnosis not present

## 2021-09-16 DIAGNOSIS — R29898 Other symptoms and signs involving the musculoskeletal system: Secondary | ICD-10-CM | POA: Diagnosis not present

## 2021-09-16 DIAGNOSIS — M792 Neuralgia and neuritis, unspecified: Secondary | ICD-10-CM | POA: Diagnosis not present

## 2021-09-16 DIAGNOSIS — S2249XD Multiple fractures of ribs, unspecified side, subsequent encounter for fracture with routine healing: Secondary | ICD-10-CM | POA: Diagnosis not present

## 2021-09-16 DIAGNOSIS — M109 Gout, unspecified: Secondary | ICD-10-CM | POA: Diagnosis not present

## 2021-09-16 DIAGNOSIS — W19XXXD Unspecified fall, subsequent encounter: Secondary | ICD-10-CM | POA: Diagnosis not present

## 2021-09-16 DIAGNOSIS — R2689 Other abnormalities of gait and mobility: Secondary | ICD-10-CM | POA: Diagnosis not present

## 2021-09-16 DIAGNOSIS — I700 Atherosclerosis of aorta: Secondary | ICD-10-CM | POA: Diagnosis not present

## 2021-09-16 DIAGNOSIS — S2241XD Multiple fractures of ribs, right side, subsequent encounter for fracture with routine healing: Secondary | ICD-10-CM | POA: Diagnosis not present

## 2021-09-16 DIAGNOSIS — D72829 Elevated white blood cell count, unspecified: Secondary | ICD-10-CM | POA: Diagnosis not present

## 2021-09-17 DIAGNOSIS — I700 Atherosclerosis of aorta: Secondary | ICD-10-CM | POA: Diagnosis not present

## 2021-09-17 DIAGNOSIS — R2689 Other abnormalities of gait and mobility: Secondary | ICD-10-CM | POA: Diagnosis not present

## 2021-09-17 DIAGNOSIS — S2241XD Multiple fractures of ribs, right side, subsequent encounter for fracture with routine healing: Secondary | ICD-10-CM | POA: Diagnosis not present

## 2021-09-17 DIAGNOSIS — D72829 Elevated white blood cell count, unspecified: Secondary | ICD-10-CM | POA: Diagnosis not present

## 2021-09-17 DIAGNOSIS — M109 Gout, unspecified: Secondary | ICD-10-CM | POA: Diagnosis not present

## 2021-09-17 DIAGNOSIS — R1312 Dysphagia, oropharyngeal phase: Secondary | ICD-10-CM | POA: Diagnosis not present

## 2021-09-17 DIAGNOSIS — R29898 Other symptoms and signs involving the musculoskeletal system: Secondary | ICD-10-CM | POA: Diagnosis not present

## 2021-09-17 DIAGNOSIS — W19XXXD Unspecified fall, subsequent encounter: Secondary | ICD-10-CM | POA: Diagnosis not present

## 2021-09-17 DIAGNOSIS — S2249XD Multiple fractures of ribs, unspecified side, subsequent encounter for fracture with routine healing: Secondary | ICD-10-CM | POA: Diagnosis not present

## 2021-09-17 DIAGNOSIS — M792 Neuralgia and neuritis, unspecified: Secondary | ICD-10-CM | POA: Diagnosis not present

## 2021-09-20 DIAGNOSIS — R2689 Other abnormalities of gait and mobility: Secondary | ICD-10-CM | POA: Diagnosis not present

## 2021-09-20 DIAGNOSIS — S2249XD Multiple fractures of ribs, unspecified side, subsequent encounter for fracture with routine healing: Secondary | ICD-10-CM | POA: Diagnosis not present

## 2021-09-20 DIAGNOSIS — S2241XD Multiple fractures of ribs, right side, subsequent encounter for fracture with routine healing: Secondary | ICD-10-CM | POA: Diagnosis not present

## 2021-09-20 DIAGNOSIS — I700 Atherosclerosis of aorta: Secondary | ICD-10-CM | POA: Diagnosis not present

## 2021-09-20 DIAGNOSIS — M109 Gout, unspecified: Secondary | ICD-10-CM | POA: Diagnosis not present

## 2021-09-20 DIAGNOSIS — W19XXXD Unspecified fall, subsequent encounter: Secondary | ICD-10-CM | POA: Diagnosis not present

## 2021-09-20 DIAGNOSIS — M792 Neuralgia and neuritis, unspecified: Secondary | ICD-10-CM | POA: Diagnosis not present

## 2021-09-20 DIAGNOSIS — R29898 Other symptoms and signs involving the musculoskeletal system: Secondary | ICD-10-CM | POA: Diagnosis not present

## 2021-09-20 DIAGNOSIS — R1312 Dysphagia, oropharyngeal phase: Secondary | ICD-10-CM | POA: Diagnosis not present

## 2021-09-20 DIAGNOSIS — D72829 Elevated white blood cell count, unspecified: Secondary | ICD-10-CM | POA: Diagnosis not present

## 2021-09-21 ENCOUNTER — Encounter: Payer: Self-pay | Admitting: Nurse Practitioner

## 2021-09-21 ENCOUNTER — Non-Acute Institutional Stay (SKILLED_NURSING_FACILITY): Payer: Medicare Other | Admitting: Nurse Practitioner

## 2021-09-21 DIAGNOSIS — E7800 Pure hypercholesterolemia, unspecified: Secondary | ICD-10-CM

## 2021-09-21 DIAGNOSIS — I10 Essential (primary) hypertension: Secondary | ICD-10-CM

## 2021-09-21 DIAGNOSIS — K5901 Slow transit constipation: Secondary | ICD-10-CM

## 2021-09-21 DIAGNOSIS — S2241XD Multiple fractures of ribs, right side, subsequent encounter for fracture with routine healing: Secondary | ICD-10-CM | POA: Diagnosis not present

## 2021-09-21 DIAGNOSIS — J449 Chronic obstructive pulmonary disease, unspecified: Secondary | ICD-10-CM

## 2021-09-21 DIAGNOSIS — N1831 Chronic kidney disease, stage 3a: Secondary | ICD-10-CM

## 2021-09-21 DIAGNOSIS — D72829 Elevated white blood cell count, unspecified: Secondary | ICD-10-CM

## 2021-09-21 DIAGNOSIS — K219 Gastro-esophageal reflux disease without esophagitis: Secondary | ICD-10-CM

## 2021-09-21 DIAGNOSIS — E871 Hypo-osmolality and hyponatremia: Secondary | ICD-10-CM | POA: Diagnosis not present

## 2021-09-21 NOTE — Progress Notes (Signed)
Location:   [place] of Service:  SNF (31)  Provider: Kahla Risdon, NP  PCP: Lawerance Cruel, MD Patient Care Team: Lawerance Cruel, MD as PCP - General (Family Medicine) Jettie Booze, MD as PCP - Cardiology (Cardiology) Jettie Booze, MD as Attending Physician (Cardiology) Melina Schools, MD as Consulting Physician (Orthopedic Surgery) Suella Broad, MD as Consulting Physician (Physical Medicine and Rehabilitation) Bo Merino, MD as Consulting Physician (Rheumatology) Gerarda Fraction, MD as Referring Physician (Ophthalmology)  Extended Emergency Contact Information Primary Emergency Contact: Torti,George Address: [address]          [place], [place] [address] [place] Phone: [phone] Relation: Spouse Secondary Emergency Contact: Gamble,Cynthia Address: [address]          [place], [place] [address] [place] Phone: [phone] Relation: Daughter  Code Status: DNR Goals of care:  Advanced Directive information    09/21/2021   12:33 PM  Advanced Directives  Does Patient Have a Medical Advance Directive? Yes  Type of Paramedic of [place];Living will;Out of facility DNR (pink MOST or yellow form)  Does patient want to make changes to medical advance directive? No - Patient declined  Copy of [place] in Chart? Yes - validated most recent copy scanned in chart (See row information)     Allergies  Allergen Reactions   Sulfa Antibiotics Itching and Hives   Codeine Nausea Only   Cymbalta [Duloxetine Hcl]     Unknown reaction   Fosamax [Alendronate]     Other reaction(s): upset hiatal hernia   Prevacid [Lansoprazole]     Chief Complaint  Patient presents with   Discharge Note    Discharge from SNF     HPI:  88 y.o. female with PMH significant for HTN, GERD, COPD, CKD< HLD, constipation was admitted to [facility] for therapy  following her hospital stay.  Hospitalized 08/22/21-08/27/21 for the R rib fractures 7th, 8th, ?9th, oxycodone prn, Tylenol, lidocaine transdermal, Voltaren gel The patient's pain is better controlled, she has regained physical strength, mobility, and ADL function. She is ready to return [place] to continue with therapy.              Constipation, stable, takes MiraLax daily, prn Bisacodyl             GERD Zofran with meals, takes Esomeprazole, Hgb 13.8 08/26/21             HTN, ASA, Metoprolol, Telmisartan             COPD, prn Albuterol, prn Breztri aerosphere,              CKD Bun/creat 45/1.19 08/26/21             HLD takes Simvastatin.              Leukocytosis,  wbc 11.7 08/26/21             Hyponatremia, Na 131 08/26/21  Past Medical History:  Diagnosis Date   Accessory kidney    Angina    3 WEEKS   Arthritis    OSTEO    Atrial fibrillation ([place]) 2014   Carotid artery bruit    Complication of anesthesia    TAKES AWHILE TO WAKE UP    Dysrhythmia    TACHYCARDIA    GERD (gastroesophageal reflux disease)    H/O hiatal hernia    Heart murmur  Hyperlipidemia    Hypertension    Macular degeneration     Past Surgical History:  Procedure Laterality Date   ABDOMINAL HYSTERECTOMY  1974   TAH w/ BSO   APPENDECTOMY     CARDIOVASCULAR STRESS TEST     5 YRS AGO     DR H SMITH  (NOW SEES DR Irish Lack)   CAROTID ENDARTERECTOMY Left 03-25-11   cea   CATARACT EXTRACTION W/ INTRAOCULAR LENS  IMPLANT, BILATERAL     ENDARTERECTOMY  03/25/2011   Procedure: ENDARTERECTOMY CAROTID;  Surgeon: Mal Misty, MD;  Location: [facility];  Service: Vascular;  Laterality: Left;  Left Carotid Endarterectomy with dacron patch angioplasty, with resection of redundant common carotid with primary reanastamosis   [place]  2012   Left Knee   [place]  2002, 2010   Lumbar disk/ spine surgeries X 2   By Dr. Rolena Infante   TONSILLECTOMY        reports  that she quit smoking about 5 years ago. Her smoking use included cigarettes. She smoked an average of .5 packs per day. She has never used smokeless tobacco. She reports current alcohol use. She reports that she does not use drugs. Social History   Socioeconomic History   Marital status: Married    Spouse name: Not on file   Number of children: Not on file   Years of education: Not on file   Highest education level: Not on file  Occupational History   Not on file  Tobacco Use   Smoking status: Former    Packs/day: 0.50    Types: Cigarettes    Quit date: 12/28/2015    Years since quitting: 5.7   Smokeless tobacco: Never  Vaping Use   Vaping Use: Never used  Substance and Sexual Activity   Alcohol use: Yes    Comment: social   Drug use: No   Sexual activity: Not on file  Other Topics Concern   Not on file  Social History Narrative   Not on file   Social Determinants of Health   Financial Resource Strain: Not on file  Food Insecurity: Not on file  Transportation Needs: Not on file  Physical Activity: Not on file  Stress: Not on file  Social Connections: Not on file  Intimate Partner Violence: Not on file   Functional Status Survey:    Allergies  Allergen Reactions   Sulfa Antibiotics Itching and Hives   Codeine Nausea Only   Cymbalta [Duloxetine Hcl]     Unknown reaction   Fosamax [Alendronate]     Other reaction(s): upset hiatal hernia   Prevacid [Lansoprazole]     Pertinent  Health Maintenance Due  Topic Date Due   INFLUENZA VACCINE  08/17/2021   DEXA SCAN  Completed    Medications: Allergies as of 09/21/2021       Reactions   Sulfa Antibiotics Itching, Hives   Codeine Nausea Only   Cymbalta [duloxetine Hcl]    Unknown reaction   Fosamax [alendronate]    Other reaction(s): upset hiatal hernia   Prevacid [lansoprazole]         Medication List        Accurate as of September 21, 2021 11:59 PM. If you have any questions, ask your nurse or  doctor.          acetaminophen 500 MG tablet Commonly known as: TYLENOL Take  500 mg by mouth every 8 (eight) hours as needed.   albuterol (2.5 MG/3ML) 0.083% nebulizer solution Commonly known as: PROVENTIL Take 3 mLs (2.5 mg total) by nebulization every 2 (two) hours as needed for wheezing or shortness of breath.   artificial tears Oint ophthalmic ointment Commonly known as: LACRILUBE Place into both eyes daily as needed for dry eyes.   aspirin EC 81 MG tablet Take 1 tablet (81 mg total) by mouth daily.   bisacodyl 10 MG suppository Commonly known as: DULCOLAX Place 1 suppository (10 mg total) rectally every 12 (twelve) hours as needed for mild constipation or moderate constipation.   Breztri Aerosphere 160-9-4.8 MCG/ACT Aero Generic drug: Budeson-Glycopyrrol-Formoterol Inhale 2 puffs into the lungs daily as needed (for shortness of breath).   diclofenac Sodium 1 % Gel Commonly known as: VOLTAREN Apply 2 g topically 4 (four) times daily.   esomeprazole 20 MG capsule Commonly known as: NEXIUM Take 1 capsule (20 mg total) by mouth 2 (two) times daily.   EYE-VITES PO Take 1 tablet by mouth daily.   lidocaine 5 % Commonly known as: LIDODERM Place 2 patches onto the skin daily. Apply 2 patches total one to Right & Left rib daily. What changed: Another medication with the same name was removed. Continue taking this medication, and follow the directions you see here. Changed by: Laquandra Carrillo X Kaiven Vester, NP   metoprolol tartrate 25 MG tablet Commonly known as: LOPRESSOR Take 1 tablet (25 mg total) by mouth 2 (two) times daily.   nitroGLYCERIN 0.4 MG SL tablet Commonly known as: NITROSTAT Place 1 tablet (0.4 mg total) under the tongue every 5 (five) minutes as needed for chest pain.   ondansetron 4 MG tablet Commonly known as: ZOFRAN Take 1 tablet (4 mg total) by mouth every 8 (eight) hours as needed for nausea or vomiting. What changed: Another medication with the same name was  removed. Continue taking this medication, and follow the directions you see here. Changed by: Juliana Boling X Karan Ramnauth, NP   oxyCODONE 5 MG immediate release tablet Commonly known as: Oxy IR/ROXICODONE Take 0.5-1 tablets (2.5-5 mg total) by mouth every 4 (four) hours as needed for moderate pain or severe pain (2.5mg  moderate, 5mg  severe).   polyethylene glycol 17 g packet Commonly known as: MIRALAX / GLYCOLAX Take 17 g by mouth daily.   simvastatin 40 MG tablet Commonly known as: ZOCOR Take 1 tablet (40 mg total) by mouth every evening.   telmisartan 40 MG tablet Commonly known as: MICARDIS Take 1 tablet (40 mg total) by mouth daily.   Vitamin B12 3000 MCG Subl Take 3,000 mcg by mouth daily.   VITAMIN D (ERGOCALCIFEROL) PO Take 1 capsule by mouth every morning.        Review of Systems  Constitutional:  Negative for fatigue, fever and unexpected weight change.  HENT:  Positive for hearing loss. Negative for congestion and trouble swallowing.   Eyes:  Negative for visual disturbance.  Respiratory:  Negative for cough, shortness of breath and wheezing.   Cardiovascular:  Positive for chest pain and leg swelling. Negative for palpitations.       Better with chest pain  Gastrointestinal:  Negative for abdominal pain and constipation.  Genitourinary:  Negative for dysuria, frequency and urgency.  Musculoskeletal:  Positive for arthralgias and gait problem.  Skin:  Negative for color change.  Neurological:  Negative for speech difficulty, weakness and headaches.  Psychiatric/Behavioral:  Negative for confusion and sleep disturbance. The patient is not nervous/anxious.  Vitals:   09/21/21 1226  BP: (!) 143/60  Pulse: 60  Resp: 18  Temp: 98 F (36.7 C)  SpO2: 95%  Weight: 118 lb 6.4 oz (53.7 kg)  Height: 5' (1.524 m)   Body mass index is 23.12 kg/m. Physical Exam Vitals and nursing note reviewed.  Constitutional:      Appearance: Normal appearance.  HENT:     Head:  Normocephalic and atraumatic.     Nose: Nose normal.     Mouth/Throat:     Mouth: Mucous membranes are moist.  Eyes:     Extraocular Movements: Extraocular movements intact.     Conjunctiva/sclera: Conjunctivae normal.     Pupils: Pupils are equal, round, and reactive to light.  Cardiovascular:     Rate and Rhythm: Normal rate and regular rhythm.     Heart sounds: Murmur heard.  Pulmonary:     Effort: Pulmonary effort is normal.     Breath sounds: No wheezing or rales.  Abdominal:     Tenderness: There is no abdominal tenderness.  Musculoskeletal:        General: Tenderness present.     Cervical back: Normal range of motion.     Right lower leg: Edema present.     Left lower leg: Edema present.     Comments: Trace edema BLE. Pain in the right chest wall with movement, deep breathing, or certain positions-improved.   Skin:    General: Skin is warm and dry.  Neurological:     General: No focal deficit present.     Mental Status: She is alert and oriented to person, place, and time. Mental status is at baseline.     Gait: Gait abnormal.  Psychiatric:        Mood and Affect: Mood normal.        Behavior: Behavior normal.        Thought Content: Thought content normal.     Labs reviewed: Basic Metabolic Panel: Recent Labs    08/23/21 0540 08/24/21 0823 08/26/21 0131  NA 139 134* 131*  K 4.0 3.9 4.0  CL 104 97* 99  CO2 29 23 22   GLUCOSE 119* 130* 113*  BUN 18 22 45*  CREATININE 1.03* 1.05* 1.19*  CALCIUM 9.2 9.7 9.3   Liver Function Tests: Recent Labs    08/22/21 2250 08/24/21 0823  AST 17 21  ALT 10 12  ALKPHOS 47 57  BILITOT 0.3 0.9  PROT 6.6 7.4  ALBUMIN 3.1* 3.5   No results for input(s): "LIPASE", "AMYLASE" in the last 8760 hours. No results for input(s): "AMMONIA" in the last 8760 hours. CBC: Recent Labs    08/22/21 2250 08/23/21 0540 08/24/21 0823 08/25/21 0646 08/26/21 0131  WBC 8.0   < > 11.5* 14.2* 11.7*  NEUTROABS 5.7  --   --   --   --    HGB 11.4*   < > 15.0 14.8 13.8  HCT 36.0   < > 45.8 46.0 42.0  MCV 94.2   < > 88.6 89.7 88.8  PLT 215   < > 297 310 280   < > = values in this interval not displayed.   Cardiac Enzymes: No results for input(s): "CKTOTAL", "CKMB", "CKMBINDEX", "TROPONINI" in the last 8760 hours. BNP: Invalid input(s): "POCBNP" CBG: No results for input(s): "GLUCAP" in the last 8760 hours.  Procedures and Imaging Studies During Stay: No results found.  Assessment/Plan:   Multiple rib fractures - right 7-8 R rib fractures 7th, 8th, ?9th,  oxycodone prn, Tylenol, lidocaine transdermal, Voltaren gel  Slow transit constipation stable, takes MiraLax daily, prn Bisacodyl  Gastroesophageal reflux disease Stable, Zofran with meals, takes Esomeprazole, Hgb 13.8 08/26/21  Hypertension Blood pressure is controlled, ASA, Metoprolol, Telmisartan  Chronic obstructive pulmonary disease, unspecified (HCC) Stable, on prn Albuterol, prn Breztri aerosphere,   Chronic kidney disease, stage 3 unspecified (HCC) Bun/creat 45/1.19 08/26/21  Pure hypercholesterolemia takes Simvastatin.   Leukocytosis wbc 11.7 08/26/21  Hyponatremia Na 131 08/26/21     Patient is being discharged with the following home health services:    Patient is being discharged with the following durable medical equipment:    Patient has been advised to f/u with their PCP in 1-2 weeks to bring them up to date on their rehab stay.  Social services at facility was responsible for arranging this appointment.  Pt was provided with a 30 day supply of prescriptions for medications and refills must be obtained from their PCP.  For controlled substances, a more limited supply may be provided adequate until PCP appointment only.  Future labs/tests needed:  prn

## 2021-09-24 ENCOUNTER — Encounter: Payer: Self-pay | Admitting: Nurse Practitioner

## 2021-09-24 DIAGNOSIS — E871 Hypo-osmolality and hyponatremia: Secondary | ICD-10-CM | POA: Insufficient documentation

## 2021-09-24 MED ORDER — METOPROLOL TARTRATE 25 MG PO TABS: 25.0000 mg | ORAL_TABLET | Freq: Two times a day (BID) | ORAL | 0 refills | Status: DC

## 2021-09-24 MED ORDER — ONDANSETRON HCL 4 MG PO TABS: 4.0000 mg | ORAL_TABLET | Freq: Three times a day (TID) | ORAL | 0 refills | Status: AC | PRN

## 2021-09-24 MED ORDER — ALBUTEROL SULFATE (2.5 MG/3ML) 0.083% IN NEBU: 2.5000 mg | INHALATION_SOLUTION | RESPIRATORY_TRACT | 0 refills | Status: AC | PRN

## 2021-09-24 MED ORDER — BREZTRI AEROSPHERE 160-9-4.8 MCG/ACT IN AERO
2.0000 | INHALATION_SPRAY | Freq: Every day | RESPIRATORY_TRACT | 0 refills | Status: AC | PRN
Start: 2021-09-24 — End: ?

## 2021-09-24 MED ORDER — TELMISARTAN 40 MG PO TABS: 40.0000 mg | ORAL_TABLET | Freq: Every day | ORAL | 0 refills | Status: AC

## 2021-09-24 MED ORDER — SIMVASTATIN 40 MG PO TABS: 40.0000 mg | ORAL_TABLET | Freq: Every evening | ORAL | 0 refills | Status: DC

## 2021-09-24 MED ORDER — ESOMEPRAZOLE MAGNESIUM 20 MG PO CPDR
20.0000 mg | DELAYED_RELEASE_CAPSULE | Freq: Two times a day (BID) | ORAL | 0 refills | Status: AC
Start: 2021-09-24 — End: ?

## 2021-09-24 NOTE — Assessment & Plan Note (Signed)
Stable, on prn Albuterol, prn [employer],

## 2021-09-24 NOTE — Assessment & Plan Note (Signed)
Na 131 08/26/21

## 2021-09-24 NOTE — Assessment & Plan Note (Signed)
stable, takes MiraLax daily, prn Bisacodyl

## 2021-09-24 NOTE — Assessment & Plan Note (Signed)
R rib fractures 7th, 8th, ?9th, oxycodone prn, Tylenol, lidocaine transdermal, Voltaren gel

## 2021-09-24 NOTE — Assessment & Plan Note (Signed)
Bun/creat 45/1.19 08/26/21

## 2021-09-24 NOTE — Assessment & Plan Note (Signed)
Blood pressure is controlled, ASA, Metoprolol, Telmisartan

## 2021-09-24 NOTE — Assessment & Plan Note (Signed)
wbc 11.7 08/26/21

## 2021-09-24 NOTE — Assessment & Plan Note (Signed)
Stable, Zofran with meals, takes Esomeprazole, Hgb 13.8 08/26/21

## 2021-09-24 NOTE — Assessment & Plan Note (Signed)
takes Simvastatin.  

## 2021-09-27 DIAGNOSIS — Z23 Encounter for immunization: Secondary | ICD-10-CM | POA: Diagnosis not present

## 2021-09-27 DIAGNOSIS — M810 Age-related osteoporosis without current pathological fracture: Secondary | ICD-10-CM | POA: Diagnosis not present

## 2021-09-27 DIAGNOSIS — S2241XD Multiple fractures of ribs, right side, subsequent encounter for fracture with routine healing: Secondary | ICD-10-CM | POA: Diagnosis not present

## 2021-10-07 ENCOUNTER — Ambulatory Visit: Payer: Medicare Other | Admitting: Interventional Cardiology

## 2021-11-08 DIAGNOSIS — M1990 Unspecified osteoarthritis, unspecified site: Secondary | ICD-10-CM | POA: Diagnosis not present

## 2021-11-08 DIAGNOSIS — M810 Age-related osteoporosis without current pathological fracture: Secondary | ICD-10-CM | POA: Diagnosis not present

## 2021-11-08 DIAGNOSIS — E7800 Pure hypercholesterolemia, unspecified: Secondary | ICD-10-CM | POA: Diagnosis not present

## 2021-11-08 DIAGNOSIS — J449 Chronic obstructive pulmonary disease, unspecified: Secondary | ICD-10-CM | POA: Diagnosis not present

## 2021-11-08 DIAGNOSIS — N183 Chronic kidney disease, stage 3 unspecified: Secondary | ICD-10-CM | POA: Diagnosis not present

## 2021-11-08 DIAGNOSIS — I10 Essential (primary) hypertension: Secondary | ICD-10-CM | POA: Diagnosis not present

## 2021-11-08 DIAGNOSIS — K219 Gastro-esophageal reflux disease without esophagitis: Secondary | ICD-10-CM | POA: Diagnosis not present

## 2021-11-16 DIAGNOSIS — R300 Dysuria: Secondary | ICD-10-CM | POA: Diagnosis not present

## 2021-11-25 ENCOUNTER — Encounter: Payer: Self-pay | Admitting: Medical

## 2021-11-25 ENCOUNTER — Ambulatory Visit
Admission: RE | Admit: 2021-11-25 | Discharge: 2021-11-25 | Disposition: A | Discharge status: Home / Self Care | Payer: Medicare Other | Source: Ambulatory Visit | Attending: Medical | Admitting: Medical

## 2021-11-25 ENCOUNTER — Other Ambulatory Visit: Payer: Self-pay | Admitting: Medical

## 2021-11-25 DIAGNOSIS — M25552 Pain in left hip: Secondary | ICD-10-CM

## 2021-11-25 DIAGNOSIS — S32502A Unspecified fracture of left pubis, initial encounter for closed fracture: Secondary | ICD-10-CM | POA: Diagnosis not present

## 2021-11-25 DIAGNOSIS — I739 Peripheral vascular disease, unspecified: Secondary | ICD-10-CM | POA: Diagnosis not present

## 2021-11-30 ENCOUNTER — Telehealth: Payer: Self-pay | Admitting: Interventional Cardiology

## 2021-11-30 NOTE — Telephone Encounter (Signed)
Patient c/o Palpitations:  High priority if patient c/o lightheadedness, shortness of breath, or chest pain  How long have you had palpitations/irregular HR/ Afib? Are you having the symptoms now?  Tachycardic episodes becoming more frequent over the past month No symptoms currently--normally occurs when patient first wakes up  Are you currently experiencing lightheadedness, SOB or CP?  No   Do you have a history of afib (atrial fibrillation) or irregular heart rhythm?  Has had tachycardia for a while  Have you checked your BP or HR? (document readings if available):  Daughter states HR is fine, but she does not have any readings, she has not checked patient's BP  Are you experiencing any other symptoms?  No

## 2021-11-30 NOTE — Telephone Encounter (Signed)
OK to take an extra metoprolol 25 mg prn daily in addition to the 25 mg BID for palpitations

## 2021-11-30 NOTE — Telephone Encounter (Signed)
Spoke to dtr, dpr on file. Reports they know pt is overdue for follow up and she wants to make an appt (I scheduled pt to see Eldridge Dace next Wednesday). Pt's heart is racing more frequently.  The Metoprolol is working to bring it down like it is supposed to. Pt has no CP, dizziness, edema or syncope. She does have SOB but hx of COPD. Aware I am forwarding this to Dr. Eldridge Dace for his FYI. Notified that office would call back if he needs something prior to appt next week. Dtr agreeable to plan (she is a Engineer, civil (consulting)).

## 2021-12-01 NOTE — Telephone Encounter (Signed)
Spoke with patient's daughter Aram Beecham.  Informed her of Dr. Hoyle Barr reply: OK to take an extra metoprolol 25 mg prn daily in addition to the 25 mg BID for palpitations     Aram Beecham verbalized understanding and states she will notify patient.  Patient has appt with Dr. Eldridge Dace on 12/08/21.  Aram Beecham expressed appreciation for call.

## 2021-12-06 NOTE — Progress Notes (Unsigned)
Cardiology Office Note   Date:  12/08/2021   ID:  Vicki Hernandez, DOB 22-Nov-1928, MRN [id number]  PCP:  Vicki Floro, MD    No chief complaint on file.  SVT  Wt Readings from Last 3 Encounters:  12/08/21 111 lb (50.3 kg)  09/21/21 118 lb 6.4 oz (53.7 kg)  08/31/21 118 lb (53.5 kg)       History of Present Illness: Vicki Hernandez is a 88 y.o. female    who has had CEA, after having a knot and bruit noted in 2013. She had a rapid pulse in 2014 while at the vascular surgeon. Sx. resolved prior to ECG. She wore a monitor in 2014. She did not have any palpitations at that time while wearing the monitor.  No arrhythmia noted at that time. She has been taken to the hospital years ago for this. She has used a beta blocker as needed which quickly resolves her symptoms.    2014 echo showed Normal LV function, HOCM anatomy.   Several years ago, she moved to [facility] and has to walk more to get to the dining hall.  No GERD sx with walking.  GERD seems to be the trigger for her SVT, but decreased GERD by eating more salads.    SVT Quickly resolves with metoprolol in the past.  SHe did not want to increase the dose in the past.     She stopped smoking cold [place] a few years ago.  In the past, most recently noted in 2022, she has noted some chest discomfort and used nitroglycerin with relief.  Also in 2022: "Palpitations have been well under control. Family was concerned that metoprolol was causing her fatigue and that she was on too much metoprolol.  Patient noted she does still have occasional palpitations that require her to sit and rest. She describes taking care of her husband is getting more difficult."  Called in 11/2021: "Pt's heart is racing more frequently.  The Metoprolol is working to bring it down like it is supposed to. Pt has no CP, dizziness, edema or syncope. She does have SOB but hx of COPD."  She was told she could: " OK to take an extra metoprolol 25  mg prn daily in addition to the 25 mg BID for palpitations     Husband died in October 26, 2021. She has had a few falls.  She had some broken ribs in 8/23.  Increased palpitations since her husband died.   She states she is taking only half tab twice a day of metoprolol.  On 1 occasion, she took an extra half tablet of metoprolol for a total of 1.5 tablets that day. She does not remember the dose of the medicine.  Past Medical History:  Diagnosis Date   Accessory kidney    Angina    3 WEEKS   Arthritis    OSTEO    Atrial fibrillation (HCC) 2014   Carotid artery bruit    Complication of anesthesia    TAKES AWHILE TO WAKE UP    Dysrhythmia    TACHYCARDIA    GERD (gastroesophageal reflux disease)    H/O hiatal hernia    Heart murmur    Hyperlipidemia    Hypertension    Macular degeneration     Past Surgical History:  Procedure Laterality Date   ABDOMINAL HYSTERECTOMY  1974   TAH w/ BSO   APPENDECTOMY     CARDIOVASCULAR STRESS TEST  5 YRS AGO     DR H SMITH  (NOW SEES DR Eldridge Dace)   CAROTID ENDARTERECTOMY Left 03-25-11   cea   CATARACT EXTRACTION W/ INTRAOCULAR LENS  IMPLANT, BILATERAL     ENDARTERECTOMY  03/25/2011   Procedure: ENDARTERECTOMY CAROTID;  Surgeon: Pryor Ochoa, MD;  Location: [facility] OR;  Service: Vascular;  Laterality: Left;  Left Carotid Endarterectomy with dacron patch angioplasty, with resection of redundant common carotid with primary reanastamosis   EYE SURGERY     HEMORRHOID SURGERY     JOINT REPLACEMENT  2012   Left Knee   KNEE SURGERY     SPINE SURGERY  2002, 2010   Lumbar disk/ spine surgeries X 2   By Dr. Shon Baton   TONSILLECTOMY       Current Outpatient Medications  Medication Sig Dispense Refill   acetaminophen (TYLENOL) 500 MG tablet Take 500 mg by mouth every 8 (eight) hours as needed.     albuterol (PROVENTIL) (2.5 MG/3ML) 0.083% nebulizer solution Take 3 mLs (2.5 mg total) by nebulization every 2 (two) hours as needed for wheezing or shortness  of breath. 3 mL 0   artificial tears (LACRILUBE) OINT ophthalmic ointment Place into both eyes daily as needed for dry eyes.     aspirin EC 81 MG tablet Take 1 tablet (81 mg total) by mouth daily. 90 tablet 3   Budeson-Glycopyrrol-Formoterol (BREZTRI AEROSPHERE) 160-9-4.8 MCG/ACT AERO Inhale 2 puffs into the lungs daily as needed (for shortness of breath). 10.7 g 0   Cyanocobalamin (VITAMIN B12) 3000 MCG SUBL Take 3,000 mcg by mouth daily.     diclofenac Sodium (VOLTAREN) 1 % GEL Apply 2 g topically 4 (four) times daily.     lidocaine (LIDODERM) 5 % Place 2 patches onto the skin daily. Apply 2 patches total one to Right & Left rib daily.     metoprolol tartrate (LOPRESSOR) 25 MG tablet Take 0.5 tablets (12.5 mg total) by mouth 2 (two) times daily. May take extra half tablet daily as needed for elevated heart rate 135 tablet 3   Multiple Vitamins-Minerals (EYE-VITES PO) Take 1 tablet by mouth daily.      nitrofurantoin, macrocrystal-monohydrate, (MACROBID) 100 MG capsule Take 100 mg by mouth every 12 (twelve) hours.     nitroGLYCERIN (NITROSTAT) 0.4 MG SL tablet Place 1 tablet (0.4 mg total) under the tongue every 5 (five) minutes as needed for chest pain. 25 tablet 6   ondansetron (ZOFRAN) 4 MG tablet Take 1 tablet (4 mg total) by mouth every 8 (eight) hours as needed for nausea or vomiting. 20 tablet 0   penicillin v potassium (VEETID) 500 MG tablet Take 500 mg by mouth 4 (four) times daily.     polyethylene glycol (MIRALAX / GLYCOLAX) packet Take 17 g by mouth daily.     rosuvastatin (CRESTOR) 20 MG tablet Take 1 tablet (20 mg total) by mouth daily. 90 tablet 3   telmisartan (MICARDIS) 40 MG tablet Take 1 tablet (40 mg total) by mouth daily. 30 tablet 0   VITAMIN D, ERGOCALCIFEROL, PO Take 1 capsule by mouth every morning.      bisacodyl (DULCOLAX) 10 MG suppository Place 1 suppository (10 mg total) rectally every 12 (twelve) hours as needed for mild constipation or moderate constipation. (Patient  not taking: Reported on 12/08/2021) 12 suppository 0   esomeprazole (NEXIUM) 20 MG capsule Take 1 capsule (20 mg total) by mouth 2 (two) times daily. (Patient not taking: Reported on 12/08/2021) 60 capsule 0  oxyCODONE (OXY IR/ROXICODONE) 5 MG immediate release tablet Take 0.5-1 tablets (2.5-5 mg total) by mouth every 4 (four) hours as needed for moderate pain or severe pain (2.5mg  moderate, 5mg  severe). (Patient not taking: Reported on 12/08/2021) 5 tablet 0   No current facility-administered medications for this visit.    Allergies:   Sulfa antibiotics, Codeine, Cymbalta [duloxetine hcl], Fosamax [alendronate], and Prevacid [lansoprazole]    Social History:  The patient  reports that she quit smoking about 5 years ago. Her smoking use included cigarettes. She smoked an average of .5 packs per day. She has never used smokeless tobacco. She reports current alcohol use. She reports that she does not use drugs.   Family History:  The patient's family history includes Heart attack in her father; Heart disease in her father and mother; Hyperlipidemia in her father; Hypertension in her father; Stroke in her mother.    ROS:  Please see the history of present illness.   Otherwise, review of systems are positive for increased palpitations.   All other systems are reviewed and negative.    PHYSICAL EXAM: VS:  BP 118/64   Pulse 64   Ht 5\' 1"  (1.549 m)   Wt 111 lb (50.3 kg)   SpO2 95%   BMI 20.97 kg/m  , BMI Body mass index is 20.97 kg/m. GEN: Well nourished, well developed, in no acute distress HEENT: normal Neck: no JVD, carotid bruits, or masses Cardiac: RRR; no murmurs, rubs, or gallops,no edema  Respiratory:  clear to auscultation bilaterally, normal work of breathing GI: soft, nontender, nondistended, + BS MS: no deformity or atrophy Skin: warm and dry, no rash Neuro:  Strength and sensation are intact Psych: euthymic mood, full affect   EKG:   The ekg ordered today demonstrates  NSR, no ST changes   Recent Labs: 08/24/2021: ALT 12 08/26/2021: BUN 45; Creatinine, Ser 1.19; Hemoglobin 13.8; Platelets 280; Potassium 4.0; Sodium 131   Lipid Panel No results found for: "CHOL", "TRIG", "HDL", "CHOLHDL", "VLDL", "LDLCALC", "LDLDIRECT"   Other studies Reviewed: Additional studies/ records that were reviewed today with results demonstrating: labs reviewed, LDL 165 in 12/2020.   ASSESSMENT AND PLAN:  SVT: Increase symptoms on current dose of metoprolol 12.5 mg twice daily.  She was given flexibility to take an extra metoprolol 12.5 mg daily as needed. Hypertension: Low-salt diet.  Try to get some activity as well. PAD: Prior carotid disease.  Had left carotid endarterectomy.  Was followed by [facility].  Hyperlipidemia not well-controlled despite simvastatin.  Will stop simvastatin and start rosuvastatin 20 mg daily.  She is due for lab work with her primary care doctor in a few months.   Mild HOCM: Avoid dehydration.  Avoid excessive vasodilators. Former smoker Dyspnea on exertion: Check BNP today along with electrolytes.   Current medicines are reviewed at length with the patient today.  The patient concerns regarding her medicines were addressed.  The following changes have been made:  No change.  Okay to continue current dose of metoprolol  Labs/ tests ordered today include: Electrolytes and BNP  Orders Placed This Encounter  Procedures   Basic Metabolic Panel (BMET)   Pro b natriuretic peptide   EKG 12-Lead    Recommend 150 minutes/week of aerobic exercise Low fat, low carb, high fiber diet recommended  Disposition:   FU in 1 year   Signed, 10/26/2021, MD  12/08/2021 2:55 PM    [facility] [address] [place], [employer], [place]  [place]  Phone: [phone]; Fax: [phone]

## 2021-12-08 ENCOUNTER — Ambulatory Visit: Payer: Medicare Other | Attending: Interventional Cardiology | Admitting: Interventional Cardiology

## 2021-12-08 ENCOUNTER — Encounter: Payer: Self-pay | Admitting: Interventional Cardiology

## 2021-12-08 VITALS — BP 118/64 | HR 64 | Ht 61.0 in | Wt 111.0 lb

## 2021-12-08 DIAGNOSIS — E782 Mixed hyperlipidemia: Secondary | ICD-10-CM

## 2021-12-08 DIAGNOSIS — Z87891 Personal history of nicotine dependence: Secondary | ICD-10-CM

## 2021-12-08 DIAGNOSIS — I421 Obstructive hypertrophic cardiomyopathy: Secondary | ICD-10-CM

## 2021-12-08 DIAGNOSIS — R0609 Other forms of dyspnea: Secondary | ICD-10-CM

## 2021-12-08 DIAGNOSIS — I471 Supraventricular tachycardia, unspecified: Secondary | ICD-10-CM | POA: Diagnosis not present

## 2021-12-08 DIAGNOSIS — I10 Essential (primary) hypertension: Secondary | ICD-10-CM

## 2021-12-08 MED ORDER — METOPROLOL TARTRATE 25 MG PO TABS: 12.5000 mg | ORAL_TABLET | Freq: Two times a day (BID) | ORAL | 3 refills | Status: DC

## 2021-12-08 MED ORDER — ROSUVASTATIN CALCIUM 20 MG PO TABS
20.0000 mg | ORAL_TABLET | Freq: Every day | ORAL | 3 refills | Status: AC
Start: 2021-12-08 — End: ?

## 2021-12-08 NOTE — Patient Instructions (Signed)
Medication Instructions:  Your physician has recommended you make the following change in your medication:  Stop Simvastatin. Start Rosuvastatin 20 mg by mouth daily   *If you need a refill on your cardiac medications before your next appointment, please call your pharmacy*   Lab Work: Lab work to be done today-BMET, BNP If you have labs (blood work) drawn today and your tests are completely normal, you will receive your results only by: MyChart Message (if you have MyChart) OR A paper copy in the mail If you have any lab test that is abnormal or we need to change your treatment, we will call you to review the results.   Testing/Procedures: none   Follow-Up: At [facility], you and your health needs are our priority.  As part of our continuing mission to provide you with exceptional heart care, we have created designated Provider Care Teams.  These Care Teams include your primary Cardiologist (physician) and Advanced Practice Providers (APPs -  Physician Assistants and Nurse Practitioners) who all work together to provide you with the care you need, when you need it.  We recommend signing up for the patient portal called "MyChart".  Sign up information is provided on this After Visit Summary.  MyChart is used to connect with patients for Virtual Visits (Telemedicine).  Patients are able to view lab/test results, encounter notes, upcoming appointments, etc.  Non-urgent messages can be sent to your provider as well.   To learn more about what you can do with MyChart, go to [email or web address].    Your next appointment:   12 month(s)  The format for your next appointment:   In Person  Provider:   Lance Muss, MD     Other Instructions    Important Information About Sugar

## 2021-12-09 LAB — BASIC METABOLIC PANEL
BUN/Creatinine Ratio: 17 (ref 12–28)
BUN: 20 mg/dL (ref 10–36)
CO2: 25 mmol/L (ref 20–29)
Calcium: 9.5 mg/dL (ref 8.7–10.3)
Chloride: 101 mmol/L (ref 96–106)
Creatinine, Ser: 1.15 mg/dL — ABNORMAL HIGH (ref 0.57–1.00)
Glucose: 86 mg/dL (ref 70–99)
Potassium: 4.6 mmol/L (ref 3.5–5.2)
Sodium: 139 mmol/L (ref 134–144)
eGFR: 45 mL/min/{1.73_m2} — ABNORMAL LOW (ref >59)

## 2021-12-09 LAB — PRO B NATRIURETIC PEPTIDE: NT-Pro BNP: 331 pg/mL (ref 0–738)

## 2021-12-13 ENCOUNTER — Telehealth: Payer: Self-pay | Admitting: *Deleted

## 2021-12-13 NOTE — Telephone Encounter (Signed)
-----   Message from Corky Crafts, MD sent at 12/10/2021 12:23 PM EST ----- No clear evidence of volume overload by blood work.  OK to give Lasix 20 mg daily prn volume overload symptoms.

## 2021-12-13 NOTE — Telephone Encounter (Signed)
Left message to call office

## 2021-12-14 NOTE — Telephone Encounter (Signed)
Called patient to let her know of Dr. Hoyle Barr result note from her lab work. Patient stated she is not going to take any lasix. She stated that her daughter is a Engineer, civil (consulting) and her son-in-law is a doctor and she is already peeing enough. Informed patient that she would only take as needed for any fluid overload symptoms. Patient refused and stated she would call if she decides to take it.

## 2021-12-14 NOTE — Telephone Encounter (Signed)
Pt returning call

## 2022-01-20 DIAGNOSIS — H353231 Exudative age-related macular degeneration, bilateral, with active choroidal neovascularization: Secondary | ICD-10-CM | POA: Diagnosis not present

## 2022-01-20 DIAGNOSIS — H00014 Hordeolum externum left upper eyelid: Secondary | ICD-10-CM | POA: Diagnosis not present

## 2022-01-20 DIAGNOSIS — H353221 Exudative age-related macular degeneration, left eye, with active choroidal neovascularization: Secondary | ICD-10-CM | POA: Diagnosis not present

## 2022-01-20 DIAGNOSIS — H353211 Exudative age-related macular degeneration, right eye, with active choroidal neovascularization: Secondary | ICD-10-CM | POA: Diagnosis not present

## 2022-01-20 DIAGNOSIS — Z961 Presence of intraocular lens: Secondary | ICD-10-CM | POA: Diagnosis not present

## 2022-01-20 DIAGNOSIS — H04123 Dry eye syndrome of bilateral lacrimal glands: Secondary | ICD-10-CM | POA: Diagnosis not present

## 2022-01-20 DIAGNOSIS — H53002 Unspecified amblyopia, left eye: Secondary | ICD-10-CM | POA: Diagnosis not present

## 2022-02-02 DIAGNOSIS — I10 Essential (primary) hypertension: Secondary | ICD-10-CM | POA: Diagnosis not present

## 2022-02-02 DIAGNOSIS — I779 Disorder of arteries and arterioles, unspecified: Secondary | ICD-10-CM | POA: Diagnosis not present

## 2022-02-02 DIAGNOSIS — Z0000 Encounter for general adult medical examination without abnormal findings: Secondary | ICD-10-CM | POA: Diagnosis not present

## 2022-02-02 DIAGNOSIS — E7800 Pure hypercholesterolemia, unspecified: Secondary | ICD-10-CM | POA: Diagnosis not present

## 2022-02-02 DIAGNOSIS — M810 Age-related osteoporosis without current pathological fracture: Secondary | ICD-10-CM | POA: Diagnosis not present

## 2022-02-02 DIAGNOSIS — M109 Gout, unspecified: Secondary | ICD-10-CM | POA: Diagnosis not present

## 2022-02-02 DIAGNOSIS — I209 Angina pectoris, unspecified: Secondary | ICD-10-CM | POA: Diagnosis not present

## 2022-02-02 DIAGNOSIS — I471 Supraventricular tachycardia, unspecified: Secondary | ICD-10-CM | POA: Diagnosis not present

## 2022-02-02 DIAGNOSIS — E538 Deficiency of other specified B group vitamins: Secondary | ICD-10-CM | POA: Diagnosis not present

## 2022-02-02 DIAGNOSIS — E611 Iron deficiency: Secondary | ICD-10-CM | POA: Diagnosis not present

## 2022-02-02 DIAGNOSIS — I700 Atherosclerosis of aorta: Secondary | ICD-10-CM | POA: Diagnosis not present

## 2022-04-30 DIAGNOSIS — R0981 Nasal congestion: Secondary | ICD-10-CM | POA: Diagnosis not present

## 2022-04-30 DIAGNOSIS — R5383 Other fatigue: Secondary | ICD-10-CM | POA: Diagnosis not present

## 2022-04-30 DIAGNOSIS — J069 Acute upper respiratory infection, unspecified: Secondary | ICD-10-CM | POA: Diagnosis not present

## 2022-04-30 DIAGNOSIS — R051 Acute cough: Secondary | ICD-10-CM | POA: Diagnosis not present

## 2022-04-30 DIAGNOSIS — Z03818 Encounter for observation for suspected exposure to other biological agents ruled out: Secondary | ICD-10-CM | POA: Diagnosis not present

## 2022-05-09 DIAGNOSIS — Z6820 Body mass index (BMI) 20.0-20.9, adult: Secondary | ICD-10-CM | POA: Diagnosis not present

## 2022-05-09 DIAGNOSIS — J441 Chronic obstructive pulmonary disease with (acute) exacerbation: Secondary | ICD-10-CM | POA: Diagnosis not present

## 2022-05-09 DIAGNOSIS — R531 Weakness: Secondary | ICD-10-CM | POA: Diagnosis not present

## 2022-05-09 DIAGNOSIS — E46 Unspecified protein-calorie malnutrition: Secondary | ICD-10-CM | POA: Diagnosis not present

## 2022-05-10 ENCOUNTER — Non-Acute Institutional Stay (SKILLED_NURSING_FACILITY): Payer: Medicare Other | Admitting: Nurse Practitioner

## 2022-05-10 ENCOUNTER — Encounter: Payer: Self-pay | Admitting: Nurse Practitioner

## 2022-05-10 DIAGNOSIS — R531 Weakness: Secondary | ICD-10-CM | POA: Insufficient documentation

## 2022-05-10 DIAGNOSIS — R059 Cough, unspecified: Secondary | ICD-10-CM | POA: Diagnosis not present

## 2022-05-10 DIAGNOSIS — E7800 Pure hypercholesterolemia, unspecified: Secondary | ICD-10-CM

## 2022-05-10 DIAGNOSIS — I10 Essential (primary) hypertension: Secondary | ICD-10-CM | POA: Diagnosis not present

## 2022-05-10 DIAGNOSIS — J449 Chronic obstructive pulmonary disease, unspecified: Secondary | ICD-10-CM

## 2022-05-10 DIAGNOSIS — J069 Acute upper respiratory infection, unspecified: Secondary | ICD-10-CM | POA: Diagnosis not present

## 2022-05-10 DIAGNOSIS — K219 Gastro-esophageal reflux disease without esophagitis: Secondary | ICD-10-CM

## 2022-05-10 DIAGNOSIS — J441 Chronic obstructive pulmonary disease with (acute) exacerbation: Secondary | ICD-10-CM | POA: Diagnosis not present

## 2022-05-10 DIAGNOSIS — K5901 Slow transit constipation: Secondary | ICD-10-CM | POA: Diagnosis not present

## 2022-05-10 DIAGNOSIS — R0602 Shortness of breath: Secondary | ICD-10-CM | POA: Diagnosis not present

## 2022-05-10 NOTE — Progress Notes (Addendum)
Location:   [facility] Room Number: 42B Place of Service:  SNF (31) Provider: Arna Snipe Cherylynn Liszewski NP  Daisy Floro, MD  Patient Care Team: Daisy Floro, MD as PCP - General (Family Medicine) Corky Crafts, MD as PCP - Cardiology (Cardiology) Corky Crafts, MD as Attending Physician (Cardiology) Venita Lick, MD as Consulting Physician (Orthopedic Surgery) Sheran Luz, MD as Consulting Physician (Physical Medicine and Rehabilitation) Pollyann Savoy, MD as Consulting Physician (Rheumatology) Marvis Repress, MD as Referring Physician (Ophthalmology)  Extended Emergency Contact Information Primary Emergency Contact: [facility] Address: [address]          [place], [place] [address] Darden [place] of [place] Home Phone: [phone] Relation: Daughter  Code Status: DNR Goals of care: Advanced Directive information    09/21/2021   12:33 PM  Advanced Directives  Does Patient Have a Medical Advance Directive? Yes  Type of Estate agent of [place];Living will;Out of facility DNR (pink MOST or yellow form)  Does patient want to make changes to medical advance directive? No - Patient declined  Copy of Healthcare Power of Attorney in Chart? Yes - validated most recent copy scanned in chart (See row information)     Chief Complaint  Patient presents with   Acute Visit    Cough, generalized weakness, poor appetite x 2-3 weeks    HPI:  Pt is a 89 y.o. female seen today for an acute visit for cough, generalized weakness, poor appetite  for 3 weeks, admitted nasal congestion, denied facial pressure, chest pain, phlegm production, SOB, dysuria, abd pain, nausea, vomiting, went to urgent care, had Mucinex, Tessalon perles. She is afebrile Hospitalized 08/22/21-08/27/21 for the R rib fractures 7th, 8th, ?9th, healed.              Constipation, stable, takes MiraLax daily, prn Bisacodyl             GERD, stable, taking Zofran,  Esomeprazole, Hgb 13.8 08/26/21             HTN, ASA, Metoprolol, Telmisartan             COPD, prn Albuterol, prn Breztri aerosphere,              CKD Bun/creat 20/1.15 12/08/21             HLD takes Simvastatin.              Leukocytosis,  wbc 11.7 08/26/21             Hyponatremia, Na 139 12/08/21   Past Medical History:  Diagnosis Date   Accessory kidney    Angina    3 WEEKS   Arthritis    OSTEO    Atrial fibrillation 2014   Carotid artery bruit    Complication of anesthesia    TAKES AWHILE TO WAKE UP    Dysrhythmia    TACHYCARDIA    GERD (gastroesophageal reflux disease)    H/O hiatal hernia    Heart murmur    Hyperlipidemia    Hypertension    Macular degeneration    Past Surgical History:  Procedure Laterality Date   ABDOMINAL HYSTERECTOMY  1974   TAH w/ BSO   APPENDECTOMY     CARDIOVASCULAR STRESS TEST     5 YRS AGO     DR H SMITH  (NOW SEES DR Eldridge Dace)   CAROTID ENDARTERECTOMY Left 03-25-11   cea   CATARACT EXTRACTION W/ INTRAOCULAR LENS  IMPLANT, BILATERAL  ENDARTERECTOMY  03/25/2011   Procedure: ENDARTERECTOMY CAROTID;  Surgeon: Pryor Ochoa, MD;  Location: [facility] OR;  Service: Vascular;  Laterality: Left;  Left Carotid Endarterectomy with dacron patch angioplasty, with resection of redundant common carotid with primary reanastamosis   EYE SURGERY     HEMORRHOID SURGERY     JOINT REPLACEMENT  2012   Left Knee   KNEE SURGERY     SPINE SURGERY  2002, 2010   Lumbar disk/ spine surgeries X 2   By Dr. Shon Baton   TONSILLECTOMY      Allergies  Allergen Reactions   Sulfa Antibiotics Itching and Hives   Codeine Nausea Only   Cymbalta [Duloxetine Hcl]     Unknown reaction   Fosamax [Alendronate]     Other reaction(s): upset hiatal hernia   Prevacid [Lansoprazole]     Allergies as of 05/10/2022       Reactions   Sulfa Antibiotics Itching, Hives   Codeine Nausea Only   Cymbalta [duloxetine Hcl]    Unknown reaction   Fosamax [alendronate]    Other  reaction(s): upset hiatal hernia   Prevacid [lansoprazole]         Medication List        Accurate as of May 10, 2022  4:22 PM. If you have any questions, ask your nurse or doctor.          acetaminophen 500 MG tablet Commonly known as: TYLENOL Take 500 mg by mouth every 8 (eight) hours as needed.   albuterol (2.5 MG/3ML) 0.083% nebulizer solution Commonly known as: PROVENTIL Take 3 mLs (2.5 mg total) by nebulization every 2 (two) hours as needed for wheezing or shortness of breath.   artificial tears Oint ophthalmic ointment Commonly known as: LACRILUBE Place into both eyes daily as needed for dry eyes.   aspirin EC 81 MG tablet Take 1 tablet (81 mg total) by mouth daily.   bisacodyl 10 MG suppository Commonly known as: DULCOLAX Place 1 suppository (10 mg total) rectally every 12 (twelve) hours as needed for mild constipation or moderate constipation.   Breztri Aerosphere 160-9-4.8 MCG/ACT Aero Generic drug: Budeson-Glycopyrrol-Formoterol Inhale 2 puffs into the lungs daily as needed (for shortness of breath).   diclofenac Sodium 1 % Gel Commonly known as: VOLTAREN Apply 2 g topically 4 (four) times daily.   esomeprazole 20 MG capsule Commonly known as: NEXIUM Take 1 capsule (20 mg total) by mouth 2 (two) times daily.   EYE-VITES PO Take 1 tablet by mouth daily.   lidocaine 5 % Commonly known as: LIDODERM Place 2 patches onto the skin daily. Apply 2 patches total one to Right & Left rib daily.   metoprolol tartrate 25 MG tablet Commonly known as: LOPRESSOR Take 0.5 tablets (12.5 mg total) by mouth 2 (two) times daily. May take extra half tablet daily as needed for elevated heart rate   nitrofurantoin (macrocrystal-monohydrate) 100 MG capsule Commonly known as: MACROBID Take 100 mg by mouth every 12 (twelve) hours.   nitroGLYCERIN 0.4 MG SL tablet Commonly known as: NITROSTAT Place 1 tablet (0.4 mg total) under the tongue every 5 (five) minutes as  needed for chest pain.   ondansetron 4 MG tablet Commonly known as: ZOFRAN Take 1 tablet (4 mg total) by mouth every 8 (eight) hours as needed for nausea or vomiting.   oxyCODONE 5 MG immediate release tablet Commonly known as: Oxy IR/ROXICODONE Take 0.5-1 tablets (2.5-5 mg total) by mouth every 4 (four) hours as needed for moderate pain  or severe pain (2.5mg  moderate, 5mg  severe).   penicillin v potassium 500 MG tablet Commonly known as: VEETID Take 500 mg by mouth 4 (four) times daily.   polyethylene glycol 17 g packet Commonly known as: MIRALAX / GLYCOLAX Take 17 g by mouth daily.   rosuvastatin 20 MG tablet Commonly known as: CRESTOR Take 1 tablet (20 mg total) by mouth daily.   telmisartan 40 MG tablet Commonly known as: MICARDIS Take 1 tablet (40 mg total) by mouth daily.   Vitamin B12 3000 MCG Subl Take 3,000 mcg by mouth daily.   VITAMIN D (ERGOCALCIFEROL) PO Take 1 capsule by mouth every morning.        Review of Systems  Constitutional:  Positive for appetite change and fatigue. Negative for fever.       Poor appetite  HENT:  Positive for congestion and hearing loss. Negative for sinus pain and trouble swallowing.   Eyes:  Negative for visual disturbance.  Respiratory:  Positive for cough. Negative for shortness of breath and wheezing.   Cardiovascular:  Negative for chest pain, palpitations and leg swelling.  Gastrointestinal:  Negative for abdominal pain and constipation.  Genitourinary:  Positive for frequency. Negative for dysuria and urgency.       2x/night  Musculoskeletal:  Positive for arthralgias and gait problem.  Skin:  Negative for color change.  Neurological:  Negative for speech difficulty, weakness and headaches.  Psychiatric/Behavioral:  Positive for sleep disturbance. Negative for confusion. The patient is not nervous/anxious.        Not sleep well at night, not new, naps during day    Immunization History  Administered Date(s)  Administered   Covid-19, Mrna,Vaccine(Spikevax)7yrs and older 02/04/2022   Influenza Split 09/30/2010, 10/16/2013, 10/08/2016, 10/19/2020   Influenza, High Dose Seasonal PF 10/08/2016, 09/27/2017   Influenza,inj,quad, With Preservative 09/24/2015   Influenza-Unspecified 09/09/2014, 10/02/2018   Moderna Sars-Covid-2 Vaccination 01/21/2019, 02/18/2019, 12/05/2019   Pfizer Covid-19 Vaccine Bivalent Booster 66yrs & up 10/07/2020   Pneumococcal Conjugate-13 08/20/2013   Pneumococcal Polysaccharide-23 11/27/2017   Tdap 01/07/2011   Pertinent  Health Maintenance Due  Topic Date Due   INFLUENZA VACCINE  08/18/2022   DEXA SCAN  Completed      08/25/2021    7:35 AM 08/25/2021    9:00 PM 08/26/2021    8:15 AM 08/26/2021   10:00 PM 08/27/2021    9:00 AM  Fall Risk  (RETIRED) Patient Fall Risk Level High fall risk High fall risk High fall risk High fall risk High fall risk   Functional Status Survey:    Vitals:   05/10/22 1321  BP: 114/60  Pulse: 69  Resp: 16  Temp: 97.7 F (36.5 C)  SpO2: 96%   There is no height or weight on file to calculate BMI. Physical Exam Vitals and nursing note reviewed.  Constitutional:      Comments: Fatigue.   HENT:     Head: Normocephalic and atraumatic.     Nose: Nose normal.     Mouth/Throat:     Mouth: Mucous membranes are moist.  Eyes:     Extraocular Movements: Extraocular movements intact.     Conjunctiva/sclera: Conjunctivae normal.     Pupils: Pupils are equal, round, and reactive to light.  Cardiovascular:     Rate and Rhythm: Normal rate and regular rhythm.     Heart sounds: Murmur heard.  Pulmonary:     Effort: Pulmonary effort is normal.     Breath sounds: No wheezing, rhonchi or  rales.     Comments: Decreased air entry both lungs.  Abdominal:     Tenderness: There is no abdominal tenderness.  Musculoskeletal:     Cervical back: Normal range of motion.     Right lower leg: No edema.     Left lower leg: No edema.  Skin:     General: Skin is warm and dry.  Neurological:     General: No focal deficit present.     Mental Status: She is alert and oriented to person, place, and time. Mental status is at baseline.     Gait: Gait abnormal.  Psychiatric:        Mood and Affect: Mood normal.        Behavior: Behavior normal.        Thought Content: Thought content normal.     Labs reviewed: Recent Labs    08/24/21 0823 08/26/21 0131 12/08/21 1440  NA 134* 131* 139  K 3.9 4.0 4.6  CL 97* 99 101  CO2 23 22 25   GLUCOSE 130* 113* 86  BUN 22 45* 20  CREATININE 1.05* 1.19* 1.15*  CALCIUM 9.7 9.3 9.5   Recent Labs    08/22/21 2250 08/24/21 0823  AST 17 21  ALT 10 12  ALKPHOS 47 57  BILITOT 0.3 0.9  PROT 6.6 7.4  ALBUMIN 3.1* 3.5   Recent Labs    08/22/21 2250 08/23/21 0540 08/24/21 0823 08/25/21 0646 08/26/21 0131  WBC 8.0   < > 11.5* 14.2* 11.7*  NEUTROABS 5.7  --   --   --   --   HGB 11.4*   < > 15.0 14.8 13.8  HCT 36.0   < > 45.8 46.0 42.0  MCV 94.2   < > 88.6 89.7 88.8  PLT 215   < > 297 310 280   < > = values in this interval not displayed.   No results found for: "TSH" No results found for: "HGBA1C" No results found for: "CHOL", "HDL", "LDLCALC", "LDLDIRECT", "TRIG", "CHOLHDL"  Significant Diagnostic Results in last 30 days:  No results found.  Assessment/Plan: Upper respiratory infection cough, generalized weakness, poor appetite  for 3 weeks, admitted nasal congestion, denied facial pressure, chest pain, phlegm production, SOB, dysuria, abd pain, nausea, vomiting, went to urgent care, had Mucinex, Tessalon perles. She is afebrile. Flonase nasal spray daily, Mucinex 600mg  bid x1 week, obtain CXR ap/lateral, CBC/diff, CMP/eGFR in am.   Slow transit constipation stable, last BM today,  takes MiraLax daily, prn Bisacodyl  Gastroesophageal reflux disease stable, taking Zofran, Esomeprazole, Hgb 13.8 08/26/21  Hypertension Blood pressure is controlled, ASA, Metoprolol,  Telmisartan  Chronic obstructive pulmonary disease, unspecified (HCC)  prn Albuterol, prn Breztri aerosphere,   Chronic kidney disease, stage 3 unspecified (HCC) Bun/creat 20/1.15 12/08/21  Pure hypercholesterolemia takes Simvastatin.  Generalized weakness X 2-3 weeks, no focal weakness, therapy  to eval/tx. Check TSH, CMP/eGFR    Family/ staff Communication: plan of care reviewed with the patient and charge nurse.   Labs/tests ordered:  CXR ap/lateral. CBC/diff, CMP/eGFR, TSH  Time spend 35 minutes.

## 2022-05-10 NOTE — Assessment & Plan Note (Signed)
Blood pressure is controlled, ASA, Metoprolol, Telmisartan 

## 2022-05-10 NOTE — Assessment & Plan Note (Signed)
stable, taking Zofran, Esomeprazole, Hgb 13.8 08/26/21

## 2022-05-10 NOTE — Assessment & Plan Note (Addendum)
X 2-3 weeks, no focal weakness, therapy  to eval/tx. Check TSH, CMP/eGFR

## 2022-05-10 NOTE — Assessment & Plan Note (Signed)
cough, generalized weakness, poor appetite  for 3 weeks, admitted nasal congestion, denied facial pressure, chest pain, phlegm production, SOB, dysuria, abd pain, nausea, vomiting, went to urgent care, had Mucinex, Tessalon perles. She is afebrile. Flonase nasal spray daily, Mucinex  bid x1 week, obtain CXR ap/lateral, CBC/diff, CMP/eGFR in am.

## 2022-05-10 NOTE — Assessment & Plan Note (Signed)
Bun/creat 20/1.15 12/08/21

## 2022-05-10 NOTE — Assessment & Plan Note (Signed)
takes Simvastatin.  

## 2022-05-10 NOTE — Assessment & Plan Note (Signed)
stable, last BM today,  takes MiraLax daily, prn Bisacodyl

## 2022-05-10 NOTE — Assessment & Plan Note (Signed)
prn Albuterol, prn [employer],

## 2022-05-11 DIAGNOSIS — J441 Chronic obstructive pulmonary disease with (acute) exacerbation: Secondary | ICD-10-CM | POA: Diagnosis not present

## 2022-05-12 ENCOUNTER — Non-Acute Institutional Stay (SKILLED_NURSING_FACILITY): Payer: Medicare Other | Admitting: Family Medicine

## 2022-05-12 DIAGNOSIS — R634 Abnormal weight loss: Secondary | ICD-10-CM | POA: Diagnosis not present

## 2022-05-12 DIAGNOSIS — I10 Essential (primary) hypertension: Secondary | ICD-10-CM

## 2022-05-12 DIAGNOSIS — J449 Chronic obstructive pulmonary disease, unspecified: Secondary | ICD-10-CM | POA: Diagnosis not present

## 2022-05-12 DIAGNOSIS — J441 Chronic obstructive pulmonary disease with (acute) exacerbation: Secondary | ICD-10-CM | POA: Diagnosis not present

## 2022-05-12 NOTE — Progress Notes (Signed)
Provider:  Jacalyn Lefevre, MD Location:      Place of Service:     PCP: Daisy Floro, MD Patient Care Team: Daisy Floro, MD as PCP - General (Family Medicine) Corky Crafts, MD as PCP - Cardiology (Cardiology) Corky Crafts, MD as Attending Physician (Cardiology) Venita Lick, MD as Consulting Physician (Orthopedic Surgery) Sheran Luz, MD as Consulting Physician (Physical Medicine and Rehabilitation) Pollyann Savoy, MD as Consulting Physician (Rheumatology) Marvis Repress, MD as Referring Physician (Ophthalmology)  Extended Emergency Contact Information Primary Emergency Contact: [facility] Address: [address]          [place], [place] [address] D[place] of [place] Home Phone: [phone] Relation: Daughter  Code Status:  Goals of Care: Advanced Directive information    09/21/2021   12:33 PM  Advanced Directives  Does Patient Have a Medical Advance Directive? Yes  Type of Estate agent of [place];Living will;Out of facility DNR (pink MOST or yellow form)  Does patient want to make changes to medical advance directive? No - Patient declined  Copy of Healthcare Power of Attorney in Chart? Yes - validated most recent copy scanned in chart (See row information)      No chief complaint on file.   HPI: Patient is a 89 y.o. female seen today for admission to [facility] for increasing malaise fatigue shortness of breath and cough.  Patient has not been feeling well for the prior 3 weeks.  She has a history of COPD.  She was admitted here about 6 months ago with multiple rib fractures after a fall, but those seem to have healed without sequelae.  She was seen at urgent care prior to her admission here and given Tessalon pearls for cough.  She continued to feel weak and is admitted for further evaluation and potential rehabilitation. Plan is to screen for correctable causes of fatigue such as anemia  hypokalemia and hypothyroid.  Chest x-ray was reported to be normal. Pending results of lab work diagnosis would be exacerbation of COPD by upper respiratory infection other problems include hypertension, macular degeneration, osteoarthritis, and osteoporosis, GERD  Past Medical History:  Diagnosis Date   Accessory kidney    Angina    3 WEEKS   Arthritis    OSTEO    Atrial fibrillation 2014   Carotid artery bruit    Complication of anesthesia    TAKES AWHILE TO WAKE UP    Dysrhythmia    TACHYCARDIA    GERD (gastroesophageal reflux disease)    H/O hiatal hernia    Heart murmur    Hyperlipidemia    Hypertension    Macular degeneration    Past Surgical History:  Procedure Laterality Date   ABDOMINAL HYSTERECTOMY  1974   TAH w/ BSO   APPENDECTOMY     CARDIOVASCULAR STRESS TEST     5 YRS AGO     DR H SMITH  (NOW SEES DR Eldridge Dace)   CAROTID ENDARTERECTOMY Left 03-25-11   cea   CATARACT EXTRACTION W/ INTRAOCULAR LENS  IMPLANT, BILATERAL     ENDARTERECTOMY  03/25/2011   Procedure: ENDARTERECTOMY CAROTID;  Surgeon: Pryor Ochoa, MD;  Location: [facility] OR;  Service: Vascular;  Laterality: Left;  Left Carotid Endarterectomy with dacron patch angioplasty, with resection of redundant common carotid with primary reanastamosis   EYE SURGERY     HEMORRHOID SURGERY     JOINT REPLACEMENT  2012   Left Knee   KNEE SURGERY  SPINE SURGERY  2002, 2010   Lumbar disk/ spine surgeries X 2   By Dr. Shon Baton   TONSILLECTOMY      reports that she quit smoking about 6 years ago. Her smoking use included cigarettes. She smoked an average of .5 packs per day. She has never used smokeless tobacco. She reports current alcohol use. She reports that she does not use drugs. Social History   Socioeconomic History   Marital status: Married    Spouse name: Not on file   Number of children: Not on file   Years of education: Not on file   Highest education level: Not on file  Occupational History   Not on file   Tobacco Use   Smoking status: Former    Packs/day: .5    Types: Cigarettes    Quit date: 12/28/2015    Years since quitting: 6.3   Smokeless tobacco: Never  Vaping Use   Vaping Use: Never used  Substance and Sexual Activity   Alcohol use: Yes    Comment: social   Drug use: No   Sexual activity: Not on file  Other Topics Concern   Not on file  Social History Narrative   Not on file   Social Determinants of Health   Financial Resource Strain: Not on file  Food Insecurity: Not on file  Transportation Needs: Not on file  Physical Activity: Not on file  Stress: Not on file  Social Connections: Not on file  Intimate Partner Violence: Not on file    Functional Status Survey:    Family History  Problem Relation Age of Onset   Hyperlipidemia Father    Hypertension Father    Heart disease Father        Heart Disease before age 8-  Bleeding problems   Heart attack Father    Stroke Mother    Heart disease Mother        After age 88    Health Maintenance  Topic Date Due   Zoster Vaccines- Shingrix (1 of 2) Never done   DTaP/Tdap/Td (2 - Td or Tdap) 01/06/2021   Medicare Annual Wellness (AWV)  01/08/2022   COVID-19 Vaccine (6 - 2023-24 season) 04/01/2022   INFLUENZA VACCINE  08/18/2022   Pneumonia Vaccine 20+ Years old  Completed   DEXA SCAN  Completed   HPV VACCINES  Aged Out    Allergies  Allergen Reactions   Sulfa Antibiotics Itching and Hives   Codeine Nausea Only   Cymbalta [Duloxetine Hcl]     Unknown reaction   Fosamax [Alendronate]     Other reaction(s): upset hiatal hernia   Prevacid [Lansoprazole]     Outpatient Encounter Medications as of 05/12/2022  Medication Sig   acetaminophen (TYLENOL) 500 MG tablet Take 500 mg by mouth every 8 (eight) hours as needed.   albuterol (PROVENTIL) (2.5 MG/3ML) 0.083% nebulizer solution Take 3 mLs (2.5 mg total) by nebulization every 2 (two) hours as needed for wheezing or shortness of breath.   artificial tears  (LACRILUBE) OINT ophthalmic ointment Place into both eyes daily as needed for dry eyes.   aspirin EC 81 MG tablet Take 1 tablet (81 mg total) by mouth daily.   bisacodyl (DULCOLAX) 10 MG suppository Place 1 suppository (10 mg total) rectally every 12 (twelve) hours as needed for mild constipation or moderate constipation. (Patient not taking: Reported on 12/08/2021)   Budeson-Glycopyrrol-Formoterol (BREZTRI AEROSPHERE) 160-9-4.8 MCG/ACT AERO Inhale 2 puffs into the lungs daily as needed (for shortness of  breath).   Cyanocobalamin (VITAMIN B12) 3000 MCG SUBL Take 3,000 mcg by mouth daily.   diclofenac Sodium (VOLTAREN) 1 % GEL Apply 2 g topically 4 (four) times daily.   esomeprazole (NEXIUM) 20 MG capsule Take 1 capsule (20 mg total) by mouth 2 (two) times daily. (Patient not taking: Reported on 12/08/2021)   lidocaine (LIDODERM) 5 % Place 2 patches onto the skin daily. Apply 2 patches total one to Right & Left rib daily.   metoprolol tartrate (LOPRESSOR) 25 MG tablet Take 0.5 tablets (12.5 mg total) by mouth 2 (two) times daily. May take extra half tablet daily as needed for elevated heart rate   Multiple Vitamins-Minerals (EYE-VITES PO) Take 1 tablet by mouth daily.    nitrofurantoin, macrocrystal-monohydrate, (MACROBID) 100 MG capsule Take 100 mg by mouth every 12 (twelve) hours.   nitroGLYCERIN (NITROSTAT) 0.4 MG SL tablet Place 1 tablet (0.4 mg total) under the tongue every 5 (five) minutes as needed for chest pain.   ondansetron (ZOFRAN) 4 MG tablet Take 1 tablet (4 mg total) by mouth every 8 (eight) hours as needed for nausea or vomiting.   oxyCODONE (OXY IR/ROXICODONE) 5 MG immediate release tablet Take 0.5-1 tablets (2.5-5 mg total) by mouth every 4 (four) hours as needed for moderate pain or severe pain (2.5mg  moderate,  severe). (Patient not taking: Reported on 12/08/2021)   penicillin v potassium (VEETID) 500 MG tablet Take 500 mg by mouth 4 (four) times daily.   polyethylene glycol  (MIRALAX / GLYCOLAX) packet Take 17 g by mouth daily.   rosuvastatin (CRESTOR) 20 MG tablet Take 1 tablet (20 mg total) by mouth daily.   telmisartan (MICARDIS) 40 MG tablet Take 1 tablet (40 mg total) by mouth daily.   VITAMIN D, ERGOCALCIFEROL, PO Take 1 capsule by mouth every morning.    No facility-administered encounter medications on file as of 05/12/2022.    Review of Systems  HENT: Negative.    Respiratory:  Positive for cough and shortness of breath.   Cardiovascular: Negative.   Musculoskeletal:  Positive for arthralgias.  Skin: Negative.   Neurological:  Positive for weakness.  Hematological: Negative.   Psychiatric/Behavioral: Negative.    All other systems reviewed and are negative.   There were no vitals filed for this visit. There is no height or weight on file to calculate BMI. Physical Exam Vitals and nursing note reviewed.  Constitutional:      Appearance: Normal appearance.  HENT:     Head: Normocephalic.     Mouth/Throat:     Pharynx: Oropharynx is clear.  Eyes:     Extraocular Movements: Extraocular movements intact.     Pupils: Pupils are equal, round, and reactive to light.  Cardiovascular:     Rate and Rhythm: Normal rate and regular rhythm.  Pulmonary:     Effort: Pulmonary effort is normal.     Breath sounds: Normal breath sounds.  Abdominal:     General: Bowel sounds are normal.     Palpations: Abdomen is soft.  Skin:    General: Skin is warm and dry.  Neurological:     General: No focal deficit present.     Mental Status: She is alert and oriented to person, place, and time.  Psychiatric:        Mood and Affect: Mood normal.        Behavior: Behavior normal.     Labs reviewed: Basic Metabolic Panel: Recent Labs    08/24/21 0823 08/26/21 0131 12/08/21 1440  NA 134* 131* 139  K 3.9 4.0 4.6  CL 97* 99 101  CO2 GLUCOSE 130* 113* 86  BUN 22 45* 20  CREATININE 1.05* 1.19* 1.15*  CALCIUM 9.7 9.3 9.5   Liver Function  Tests: Recent Labs    08/22/21 2250 08/24/21 0823  AST 17 21  ALT 10 12  ALKPHOS 47 57  BILITOT 0.3 0.9  PROT 6.6 7.4  ALBUMIN 3.1* 3.5   No results for input(s): "LIPASE", "AMYLASE" in the last 8760 hours. No results for input(s): "AMMONIA" in the last 8760 hours. CBC: Recent Labs    08/22/21 2250 08/23/21 0540 08/24/21 0823 08/25/21 0646 08/26/21 0131  WBC 8.0   < > 11.5* 14.2* 11.7*  NEUTROABS 5.7  --   --   --   --   HGB 11.4*   < > 15.0 14.8 13.8  HCT 36.0   < > 45.8 46.0 42.0  MCV 94.2   < > 88.6 89.7 88.8  PLT 215   < > 297 310 280   < > = values in this interval not displayed.   Cardiac Enzymes: No results for input(s): "CKTOTAL", "CKMB", "CKMBINDEX", "TROPONINI" in the last 8760 hours. BNP: Invalid input(s): "POCBNP" No results found for: "HGBA1C" No results found for: "TSH" Lab Results  Component Value Date   VITAMINB12 461 03/18/2020   No results found for: "FOLATE" No results found for: "IRON", "TIBC", "FERRITIN"  Imaging and Procedures obtained prior to SNF admission: CT EXTREMITY LOWER LEFT WO CONTRAST  Result Date: 11/29/2021 CLINICAL DATA:  Recent fall.  Negative x-rays.  Cannot bear weight. EXAM: CT OF THE LOWER LEFT EXTREMITY WITHOUT CONTRAST TECHNIQUE: Multidetector CT imaging of the lower left extremity was performed according to the standard protocol. RADIATION DOSE REDUCTION: This exam was performed according to the departmental dose-optimization program which includes automated exposure control, adjustment of the mA and/or kV according to patient size and/or use of iterative reconstruction technique. COMPARISON:  CT abdomen/pelvis 08/24/2021 FINDINGS: Bones/Joint/Cartilage Generalized osteopenia. Subtle nondisplaced fracture of the left inferior pubic ramus (image a 67/series 2). Otherwise no acute fracture or dislocation. Normal alignment. No joint effusion. Grade 1 anterolisthesis of L4 on L5 secondary to facet disease. Broad-based disc bulge at  L4-5 with bilateral mild foraminal narrowing. Mild broad-based disc bulge at L5-S1 with moderate right foraminal narrowing. Mild osteoarthritis of bilateral SI joints. Ligaments Ligaments are suboptimally evaluated by CT. Muscles and Tendons Muscles are normal. No muscle atrophy. No intramuscular fluid collection or hematoma. Soft tissue No fluid collection or hematoma. No soft tissue mass. Peripheral vascular atherosclerotic disease. Partial visualize is an infrarenal abdominal aortic aneurysm which on 08/24/2021 measured 4.4 cm. IMPRESSION: 1. Subtle nondisplaced fracture of the left inferior pubic ramus (image a 67/series 2). 2. Otherwise, no acute osseous injury of the left lower extremity. 3. Grade 1 anterolisthesis of L4 on L5 secondary to facet disease. Broad-based disc bulge at L4-5 with bilateral mild foraminal narrowing. Mild broad-based disc bulge at L5-S1 with moderate right foraminal narrowing. 4. 4.4 cm infrarenal abdominal aortic aneurysm. Recommend follow-up every 12 months and vascular consultation. Reference: J Am Coll Radiol 2013;10:789-794. Electronically Signed   By: Elige Ko M.D.   On: 11/29/2021 08:50   1. Chronic obstructive pulmonary disease, unspecified COPD type Continue with inhalers, Breztri and albuterol Speech therapy to evaluate for dysphagia and potential for aspiration  2. Essential hypertension, benign Blood pressure controlled with telmisartan        Family/  staff Communication:   Labs/tests ordered: per NP  Bertram Millard. Hyacinth Meeker, MD [facility] [address] [place], [place] [address] Office [phone]

## 2022-05-13 DIAGNOSIS — J441 Chronic obstructive pulmonary disease with (acute) exacerbation: Secondary | ICD-10-CM | POA: Diagnosis not present

## 2022-05-15 DIAGNOSIS — J441 Chronic obstructive pulmonary disease with (acute) exacerbation: Secondary | ICD-10-CM | POA: Diagnosis not present

## 2022-05-16 DIAGNOSIS — J441 Chronic obstructive pulmonary disease with (acute) exacerbation: Secondary | ICD-10-CM | POA: Diagnosis not present

## 2022-05-17 DIAGNOSIS — J441 Chronic obstructive pulmonary disease with (acute) exacerbation: Secondary | ICD-10-CM | POA: Diagnosis not present

## 2022-05-18 DIAGNOSIS — R29898 Other symptoms and signs involving the musculoskeletal system: Secondary | ICD-10-CM | POA: Diagnosis not present

## 2022-05-18 DIAGNOSIS — M6281 Muscle weakness (generalized): Secondary | ICD-10-CM | POA: Diagnosis not present

## 2022-05-18 DIAGNOSIS — K219 Gastro-esophageal reflux disease without esophagitis: Secondary | ICD-10-CM | POA: Diagnosis not present

## 2022-05-18 DIAGNOSIS — R1312 Dysphagia, oropharyngeal phase: Secondary | ICD-10-CM | POA: Diagnosis not present

## 2022-05-19 DIAGNOSIS — R1312 Dysphagia, oropharyngeal phase: Secondary | ICD-10-CM | POA: Diagnosis not present

## 2022-05-19 DIAGNOSIS — M6281 Muscle weakness (generalized): Secondary | ICD-10-CM | POA: Diagnosis not present

## 2022-05-19 DIAGNOSIS — R29898 Other symptoms and signs involving the musculoskeletal system: Secondary | ICD-10-CM | POA: Diagnosis not present

## 2022-05-19 DIAGNOSIS — K219 Gastro-esophageal reflux disease without esophagitis: Secondary | ICD-10-CM | POA: Diagnosis not present

## 2022-05-20 ENCOUNTER — Non-Acute Institutional Stay (SKILLED_NURSING_FACILITY): Payer: Medicare Other | Admitting: Nurse Practitioner

## 2022-05-20 ENCOUNTER — Encounter: Payer: Self-pay | Admitting: Nurse Practitioner

## 2022-05-20 DIAGNOSIS — N1831 Chronic kidney disease, stage 3a: Secondary | ICD-10-CM | POA: Diagnosis not present

## 2022-05-20 DIAGNOSIS — K219 Gastro-esophageal reflux disease without esophagitis: Secondary | ICD-10-CM

## 2022-05-20 DIAGNOSIS — E871 Hypo-osmolality and hyponatremia: Secondary | ICD-10-CM

## 2022-05-20 DIAGNOSIS — R531 Weakness: Secondary | ICD-10-CM

## 2022-05-20 DIAGNOSIS — J449 Chronic obstructive pulmonary disease, unspecified: Secondary | ICD-10-CM | POA: Diagnosis not present

## 2022-05-20 DIAGNOSIS — I10 Essential (primary) hypertension: Secondary | ICD-10-CM

## 2022-05-20 DIAGNOSIS — E611 Iron deficiency: Secondary | ICD-10-CM

## 2022-05-20 DIAGNOSIS — K5901 Slow transit constipation: Secondary | ICD-10-CM

## 2022-05-20 DIAGNOSIS — E7800 Pure hypercholesterolemia, unspecified: Secondary | ICD-10-CM

## 2022-05-20 DIAGNOSIS — D72829 Elevated white blood cell count, unspecified: Secondary | ICD-10-CM

## 2022-05-20 DIAGNOSIS — M6281 Muscle weakness (generalized): Secondary | ICD-10-CM | POA: Diagnosis not present

## 2022-05-20 DIAGNOSIS — R1312 Dysphagia, oropharyngeal phase: Secondary | ICD-10-CM | POA: Diagnosis not present

## 2022-05-20 DIAGNOSIS — R29898 Other symptoms and signs involving the musculoskeletal system: Secondary | ICD-10-CM | POA: Diagnosis not present

## 2022-05-20 NOTE — Assessment & Plan Note (Signed)
Bun/creat 36/1.19 05/12/22

## 2022-05-20 NOTE — Progress Notes (Signed)
Location:  [facility] Room Number: 50-A Place of Service:  SNF [phone])  Provider: Avan Gullett Dicie Beam  PCP: Daisy Floro, MD Patient Care Team: Daisy Floro, MD as PCP - General (Family Medicine) Corky Crafts, MD as PCP - Cardiology (Cardiology) Corky Crafts, MD as Attending Physician (Cardiology) Venita Lick, MD as Consulting Physician (Orthopedic Surgery) Sheran Luz, MD as Consulting Physician (Physical Medicine and Rehabilitation) Pollyann Savoy, MD as Consulting Physician (Rheumatology) Marvis Repress, MD as Referring Physician (Ophthalmology)  Extended Emergency Contact Information Primary Emergency Contact: [facility] Address: [address]          [place], [place] [address] [place] Home Phone: [phone] Relation: Daughter  Code Status: DNR Goals of care:  Advanced Directive information    05/20/2022   12:05 PM  Advanced Directives  Does Patient Have a Medical Advance Directive? Yes  Type of Advance Directive Living will;Out of facility DNR (pink MOST or yellow form)  Does patient want to make changes to medical advance directive? No - Patient declined  Pre-existing out of facility DNR order (yellow form or pink MOST form) Pink MOST/Yellow Form most recent copy in chart - Physician notified to receive inpatient order;Pink MOST form placed in chart (order not valid for inpatient use)     Allergies  Allergen Reactions   Sulfa Antibiotics Itching and Hives   Codeine Nausea Only   Cymbalta [Duloxetine Hcl]     Unknown reaction   Fosamax [Alendronate]     Other reaction(s): upset hiatal hernia   Prevacid [Lansoprazole]     Chief Complaint  Patient presents with   Discharge Note    [place] at [facility]    HPI:  89 y.o. female with past medical history significant of HTN, COPD, CKD, HLD, GERD was admitted to SNF [facility] for generalized weakness, cough and therapy.    Her cough has resolved, 05/10/22 CXR  at [facility] was negative for acute process, she has regained her physical strength, ambulates with walker, her appetite is at her baseline. She is ready to return to her [place] [facility] Hospitalized 08/22/21-08/27/21 for the R rib fractures 7th, 8th, ?9th, healed.              Constipation, stable, takes MiraLax daily, prn Bisacodyl             GERD, stable, taking Zofran, Esomeprazole, Hgb 10.1 05/12/22             HTN, ASA, Metoprolol, Telmisartan             COPD, prn Albuterol, prn Breztri aerosphere,              CKD Bun/creat 36/1.19 05/12/22             HLD takes Simvastatin.              Leukocytosis,  wbc 7.5 05/12/22             Hyponatremia, Na 143 05/12/22   Past Medical History:  Diagnosis Date   Accessory kidney    Angina    3 WEEKS   Arthritis    OSTEO    Atrial fibrillation (HCC) 2014   Carotid artery bruit    Complication of anesthesia    TAKES AWHILE TO WAKE UP    Dysrhythmia    TACHYCARDIA    GERD (gastroesophageal reflux disease)    H/O hiatal hernia    Heart murmur    Hyperlipidemia  Hypertension    Macular degeneration     Past Surgical History:  Procedure Laterality Date   ABDOMINAL HYSTERECTOMY  1974   TAH w/ BSO   APPENDECTOMY     CARDIOVASCULAR STRESS TEST     5 YRS AGO     DR H SMITH  (NOW SEES DR Eldridge Dace)   CAROTID ENDARTERECTOMY Left 03-25-11   cea   CATARACT EXTRACTION W/ INTRAOCULAR LENS  IMPLANT, BILATERAL     ENDARTERECTOMY  03/25/2011   Procedure: ENDARTERECTOMY CAROTID;  Surgeon: Pryor Ochoa, MD;  Location: [facility] OR;  Service: Vascular;  Laterality: Left;  Left Carotid Endarterectomy with dacron patch angioplasty, with resection of redundant common carotid with primary reanastamosis   EYE SURGERY     HEMORRHOID SURGERY     JOINT REPLACEMENT  2012   Left Knee   KNEE SURGERY     SPINE SURGERY  2002, 2010   Lumbar disk/ spine surgeries X 2   By Dr. Shon Baton   TONSILLECTOMY        reports that she quit smoking about 6 years ago. Her smoking use  included cigarettes. She smoked an average of .5 packs per day. She has never used smokeless tobacco. She reports current alcohol use. She reports that she does not use drugs. Social History   Socioeconomic History   Marital status: Married    Spouse name: Not on file   Number of children: Not on file   Years of education: Not on file   Highest education level: Not on file  Occupational History   Not on file  Tobacco Use   Smoking status: Former    Packs/day: .5    Types: Cigarettes    Quit date: 12/28/2015    Years since quitting: 6.3   Smokeless tobacco: Never  Vaping Use   Vaping Use: Never used  Substance and Sexual Activity   Alcohol use: Yes    Comment: social   Drug use: No   Sexual activity: Not on file  Other Topics Concern   Not on file  Social History Narrative   Not on file   Social Determinants of Health   Financial Resource Strain: Not on file  Food Insecurity: Not on file  Transportation Needs: Not on file  Physical Activity: Not on file  Stress: Not on file  Social Connections: Not on file  Intimate Partner Violence: Not on file   Functional Status Survey:    Allergies  Allergen Reactions   Sulfa Antibiotics Itching and Hives   Codeine Nausea Only   Cymbalta [Duloxetine Hcl]     Unknown reaction   Fosamax [Alendronate]     Other reaction(s): upset hiatal hernia   Prevacid [Lansoprazole]     Pertinent  Health Maintenance Due  Topic Date Due   INFLUENZA VACCINE  08/18/2022   DEXA SCAN  Completed    Medications: Outpatient Encounter Medications as of 05/20/2022  Medication Sig   acetaminophen (TYLENOL) 325 MG tablet Take 650 mg by mouth every 4 (four) hours as needed.   albuterol (VENTOLIN HFA) 108 (90 Base) MCG/ACT inhaler Inhale 2 puffs into the lungs every 4 (four) hours as needed for wheezing or shortness of breath.   aspirin EC 81 MG tablet Take 1 tablet (81 mg total) by mouth daily.   Budeson-Glycopyrrol-Formoterol (BREZTRI  AEROSPHERE) 160-9-4.8 MCG/ACT AERO Inhale 2 puffs into the lungs daily as needed (for shortness of breath).   esomeprazole (NEXIUM) 20 MG capsule Take 1 capsule (20 mg total)  by mouth 2 (two) times daily.   fluticasone (FLONASE ALLERGY RELIEF) 50 MCG/ACT nasal spray Place 1 spray into both nostrils daily.   lactose free nutrition (BOOST) LIQD Give 1 can by mouth two times a day related to ABNORMAL WEIGHT LOSS   metoprolol tartrate (LOPRESSOR) 25 MG tablet Take 0.5 tablets (12.5 mg total) by mouth 2 (two) times daily. May take extra half tablet daily as needed for elevated heart rate   polyethylene glycol (MIRALAX / GLYCOLAX) packet Take 17 g by mouth daily.   telmisartan (MICARDIS) 20 MG tablet Take 20 mg by mouth daily.   albuterol (PROVENTIL) (2.5 MG/3ML) 0.083% nebulizer solution Take 3 mLs (2.5 mg total) by nebulization every 2 (two) hours as needed for wheezing or shortness of breath. (Patient not taking: Reported on 05/20/2022)   artificial tears (LACRILUBE) OINT ophthalmic ointment Place into both eyes daily as needed for dry eyes. (Patient not taking: Reported on 05/20/2022)   bisacodyl (DULCOLAX) 10 MG suppository Place 1 suppository (10 mg total) rectally every 12 (twelve) hours as needed for mild constipation or moderate constipation. (Patient not taking: Reported on 12/08/2021)   Cyanocobalamin (VITAMIN B12) 3000 MCG SUBL Take 3,000 mcg by mouth daily. (Patient not taking: Reported on 05/20/2022)   diclofenac Sodium (VOLTAREN) 1 % GEL Apply 2 g topically 4 (four) times daily. (Patient not taking: Reported on 05/20/2022)   lidocaine (LIDODERM) 5 % Place 2 patches onto the skin daily. Apply 2 patches total one to Right & Left rib daily. (Patient not taking: Reported on 05/20/2022)   nitrofurantoin, macrocrystal-monohydrate, (MACROBID) 100 MG capsule Take 100 mg by mouth every 12 (twelve) hours. (Patient not taking: Reported on 05/20/2022)   nitroGLYCERIN (NITROSTAT) 0.4 MG SL tablet Place 1 tablet (0.4  mg total) under the tongue every 5 (five) minutes as needed for chest pain. (Patient not taking: Reported on 05/20/2022)   ondansetron (ZOFRAN) 4 MG tablet Take 1 tablet (4 mg total) by mouth every 8 (eight) hours as needed for nausea or vomiting. (Patient not taking: Reported on 05/20/2022)   oxyCODONE (OXY IR/ROXICODONE) 5 MG immediate release tablet Take 0.5-1 tablets (2.5-5 mg total) by mouth every 4 (four) hours as needed for moderate pain or severe pain (2.5mg  moderate, 5mg  severe). (Patient not taking: Reported on 12/08/2021)   penicillin v potassium (VEETID) 500 MG tablet Take 500 mg by mouth 4 (four) times daily. (Patient not taking: Reported on 05/20/2022)   rosuvastatin (CRESTOR) 20 MG tablet Take 1 tablet (20 mg total) by mouth daily. (Patient not taking: Reported on 05/20/2022)   telmisartan (MICARDIS) 40 MG tablet Take 1 tablet (40 mg total) by mouth daily. (Patient not taking: Reported on 05/20/2022)   VITAMIN D, ERGOCALCIFEROL, PO Take 1 capsule by mouth every morning.  (Patient not taking: Reported on 05/20/2022)   [DISCONTINUED] acetaminophen (TYLENOL) 500 MG tablet Take 500 mg by mouth every 8 (eight) hours as needed.   [DISCONTINUED] Multiple Vitamins-Minerals (EYE-VITES PO) Take 1 tablet by mouth daily.    No facility-administered encounter medications on file as of 05/20/2022.    Review of Systems  Constitutional:  Negative for appetite change, fatigue and fever.  HENT:  Positive for hearing loss. Negative for congestion, sinus pain and trouble swallowing.   Eyes:  Negative for visual disturbance.  Respiratory:  Negative for cough, shortness of breath and wheezing.   Cardiovascular:  Negative for chest pain and leg swelling.  Gastrointestinal:  Negative for abdominal pain and constipation.  Genitourinary:  Positive for  frequency. Negative for dysuria and urgency.       2x/night  Musculoskeletal:  Positive for arthralgias and gait problem.  Skin:  Negative for color change.   Neurological:  Negative for speech difficulty, weakness and headaches.  Psychiatric/Behavioral:  Negative for confusion and sleep disturbance. The patient is not nervous/anxious.     Vitals:   05/20/22 1155  BP: 118/66  Pulse: 72  Resp: 18  Temp: 97.6 F (36.4 C)  SpO2: 97%  Weight: 109 lb 1.6 oz (49.5 kg)  Height: 5\' 1"  (1.549 m)   Body mass index is 20.61 kg/m. Physical Exam Vitals and nursing note reviewed.  Constitutional:      Appearance: Normal appearance.  HENT:     Head: Normocephalic and atraumatic.     Nose: Nose normal.     Mouth/Throat:     Mouth: Mucous membranes are moist.  Eyes:     Extraocular Movements: Extraocular movements intact.     Conjunctiva/sclera: Conjunctivae normal.     Pupils: Pupils are equal, round, and reactive to light.  Cardiovascular:     Rate and Rhythm: Normal rate and regular rhythm.     Heart sounds: Murmur heard.  Pulmonary:     Effort: Pulmonary effort is normal.     Breath sounds: Rales present.     Comments: Decreased air entry both lungs. Bibasilar rales.  Abdominal:     Tenderness: There is no abdominal tenderness.  Musculoskeletal:     Cervical back: Normal range of motion.     Right lower leg: No edema.     Left lower leg: No edema.  Skin:    General: Skin is warm and dry.  Neurological:     General: No focal deficit present.     Mental Status: She is alert and oriented to person, place, and time. Mental status is at baseline.     Gait: Gait abnormal.  Psychiatric:        Mood and Affect: Mood normal.        Behavior: Behavior normal.        Thought Content: Thought content normal.     Labs reviewed: Basic Metabolic Panel: Recent Labs    08/24/21 0823 08/26/21 0131 12/08/21 1440  NA 134* 131* 139  K 3.9 4.0 4.6  CL 97* 99 101  CO2 23 22 25   GLUCOSE 130* 113* 86  BUN 22 45* 20  CREATININE 1.05* 1.19* 1.15*  CALCIUM 9.7 9.3 9.5   Liver Function Tests: Recent Labs    08/22/21 2250 08/24/21 0823   AST 17 21  ALT 10 12  ALKPHOS 47 57  BILITOT 0.3 0.9  PROT 6.6 7.4  ALBUMIN 3.1* 3.5   No results for input(s): "LIPASE", "AMYLASE" in the last 8760 hours. No results for input(s): "AMMONIA" in the last 8760 hours. CBC: Recent Labs    08/22/21 2250 08/23/21 0540 08/24/21 0823 08/25/21 0646 08/26/21 0131  WBC 8.0   < > 11.5* 14.2* 11.7*  NEUTROABS 5.7  --   --   --   --   HGB 11.4*   < > 15.0 14.8 13.8  HCT 36.0   < > 45.8 46.0 42.0  MCV 94.2   < > 88.6 89.7 88.8  PLT 215   < > 297 310 280   < > = values in this interval not displayed.   Cardiac Enzymes: No results for input(s): "CKTOTAL", "CKMB", "CKMBINDEX", "TROPONINI" in the last 8760 hours. BNP: Invalid input(s): "POCBNP" CBG: No  results for input(s): "GLUCAP" in the last 8760 hours.  Procedures and Imaging Studies During Stay: No results found.  Assessment/Plan:   There are no diagnoses linked to this encounter.   Patient is being discharged with the following home health services:    Patient is being discharged with the following durable medical equipment:    Patient has been advised to f/u with their PCP in 1-2 weeks to for a transitions of care visit.  Social services at their facility was responsible for arranging this appointment.  Pt was provided with adequate prescriptions of noncontrolled medications to reach the scheduled appointment .  For controlled substances, a limited supply was provided as appropriate for the individual patient.  If the pt normally receives these medications from a pain clinic or has a contract with another physician, these medications should be received from that clinic or physician only).    Future labs/tests needed:  prn

## 2022-05-20 NOTE — Assessment & Plan Note (Signed)
Stable, stable, taking Zofran, Esomeprazole, Hgb 10.1 05/12/22

## 2022-05-20 NOTE — Assessment & Plan Note (Signed)
Stable, stable, takes MiraLax daily, prn Bisacodyl

## 2022-05-20 NOTE — Assessment & Plan Note (Signed)
Hgb 10.1 05/11/22 dropped from 08/27/22 Hgb 13.8, no active bleeding, observe for now

## 2022-05-20 NOTE — Assessment & Plan Note (Signed)
Na 143 05/12/22

## 2022-05-20 NOTE — Assessment & Plan Note (Signed)
Blood pressure is controlled, ASA, Metoprolol, Telmisartan 

## 2022-05-20 NOTE — Assessment & Plan Note (Addendum)
Improved, at her baseline, ambulates with walker in [place] [facility]. TSH 2.48 05/12/22

## 2022-05-20 NOTE — Assessment & Plan Note (Signed)
takes Simvastatin.  

## 2022-05-20 NOTE — Assessment & Plan Note (Addendum)
Stable,  prn Albuterol, prn Breztri aerosphere, 05/10/22 CXR at [facility] was negative for acute process,

## 2022-05-20 NOTE — Assessment & Plan Note (Signed)
Wnl wbc 7.5 05/12/22

## 2022-07-07 ENCOUNTER — Other Ambulatory Visit: Payer: Self-pay | Admitting: Gastroenterology

## 2022-07-07 DIAGNOSIS — R634 Abnormal weight loss: Secondary | ICD-10-CM | POA: Diagnosis not present

## 2022-07-07 DIAGNOSIS — R112 Nausea with vomiting, unspecified: Secondary | ICD-10-CM | POA: Diagnosis not present

## 2022-07-07 DIAGNOSIS — R140 Abdominal distension (gaseous): Secondary | ICD-10-CM | POA: Diagnosis not present

## 2022-07-07 DIAGNOSIS — R1310 Dysphagia, unspecified: Secondary | ICD-10-CM

## 2022-07-07 DIAGNOSIS — K219 Gastro-esophageal reflux disease without esophagitis: Secondary | ICD-10-CM | POA: Diagnosis not present

## 2022-07-07 DIAGNOSIS — K5904 Chronic idiopathic constipation: Secondary | ICD-10-CM | POA: Diagnosis not present

## 2022-07-11 ENCOUNTER — Ambulatory Visit
Admission: RE | Admit: 2022-07-11 | Discharge: 2022-07-11 | Disposition: A | Discharge status: Home / Self Care | Payer: Medicare Other | Source: Ambulatory Visit | Attending: Gastroenterology | Admitting: Gastroenterology

## 2022-07-11 DIAGNOSIS — R1310 Dysphagia, unspecified: Secondary | ICD-10-CM

## 2022-07-11 DIAGNOSIS — K224 Dyskinesia of esophagus: Secondary | ICD-10-CM | POA: Diagnosis not present

## 2022-07-11 DIAGNOSIS — R1110 Vomiting, unspecified: Secondary | ICD-10-CM | POA: Diagnosis not present

## 2022-08-26 ENCOUNTER — Encounter: Payer: Self-pay | Admitting: Family Medicine

## 2022-08-29 ENCOUNTER — Encounter: Payer: Self-pay | Admitting: Family Medicine

## 2022-12-03 ENCOUNTER — Other Ambulatory Visit: Payer: Self-pay | Admitting: Interventional Cardiology

## 2022-12-08 DIAGNOSIS — J449 Chronic obstructive pulmonary disease, unspecified: Secondary | ICD-10-CM | POA: Diagnosis not present

## 2022-12-08 DIAGNOSIS — I700 Atherosclerosis of aorta: Secondary | ICD-10-CM | POA: Diagnosis not present

## 2022-12-08 DIAGNOSIS — Z79899 Other long term (current) drug therapy: Secondary | ICD-10-CM | POA: Diagnosis not present

## 2022-12-08 DIAGNOSIS — I779 Disorder of arteries and arterioles, unspecified: Secondary | ICD-10-CM | POA: Diagnosis not present

## 2022-12-08 DIAGNOSIS — E7800 Pure hypercholesterolemia, unspecified: Secondary | ICD-10-CM | POA: Diagnosis not present

## 2022-12-08 DIAGNOSIS — M109 Gout, unspecified: Secondary | ICD-10-CM | POA: Diagnosis not present

## 2022-12-08 DIAGNOSIS — Z0000 Encounter for general adult medical examination without abnormal findings: Secondary | ICD-10-CM | POA: Diagnosis not present

## 2022-12-08 DIAGNOSIS — Z6820 Body mass index (BMI) 20.0-20.9, adult: Secondary | ICD-10-CM | POA: Diagnosis not present

## 2022-12-08 DIAGNOSIS — I421 Obstructive hypertrophic cardiomyopathy: Secondary | ICD-10-CM | POA: Diagnosis not present

## 2022-12-08 DIAGNOSIS — I7143 Infrarenal abdominal aortic aneurysm, without rupture: Secondary | ICD-10-CM | POA: Diagnosis not present

## 2022-12-08 DIAGNOSIS — N1831 Chronic kidney disease, stage 3a: Secondary | ICD-10-CM | POA: Diagnosis not present

## 2022-12-08 DIAGNOSIS — Z9989 Dependence on other enabling machines and devices: Secondary | ICD-10-CM | POA: Diagnosis not present

## 2022-12-08 DIAGNOSIS — M810 Age-related osteoporosis without current pathological fracture: Secondary | ICD-10-CM | POA: Diagnosis not present

## 2022-12-08 DIAGNOSIS — H6121 Impacted cerumen, right ear: Secondary | ICD-10-CM | POA: Diagnosis not present

## 2022-12-08 DIAGNOSIS — E611 Iron deficiency: Secondary | ICD-10-CM | POA: Diagnosis not present

## 2022-12-08 DIAGNOSIS — E538 Deficiency of other specified B group vitamins: Secondary | ICD-10-CM | POA: Diagnosis not present

## 2022-12-12 ENCOUNTER — Ambulatory Visit: Payer: Medicare Other | Admitting: Cardiology

## 2023-01-17 ENCOUNTER — Other Ambulatory Visit: Payer: Self-pay | Admitting: Cardiology

## 2023-01-19 ENCOUNTER — Other Ambulatory Visit: Payer: Self-pay | Admitting: Cardiology

## 2023-02-08 ENCOUNTER — Other Ambulatory Visit: Payer: Self-pay | Admitting: Interventional Cardiology

## 2023-04-10 DIAGNOSIS — Z9181 History of falling: Secondary | ICD-10-CM | POA: Diagnosis not present

## 2023-04-10 DIAGNOSIS — M5459 Other low back pain: Secondary | ICD-10-CM | POA: Diagnosis not present

## 2023-04-10 DIAGNOSIS — M6281 Muscle weakness (generalized): Secondary | ICD-10-CM | POA: Diagnosis not present

## 2023-07-26 ENCOUNTER — Telehealth: Payer: Self-pay | Admitting: Cardiovascular Disease

## 2023-07-26 MED ORDER — METOPROLOL TARTRATE 25 MG PO TABS: ORAL_TABLET | ORAL | 0 refills | Status: DC

## 2023-07-26 NOTE — Telephone Encounter (Signed)
*  STAT* If patient is at the pharmacy, call can be transferred to refill team.   1. Which medications need to be refilled? (please list name of each medication and dose if known)   metoprolol  tartrate (LOPRESSOR ) 25 MG tablet     4. Which pharmacy/location (including street and city if local pharmacy) is medication to be sent to? [facility]  - [place], [place] - [address]     5. Do they need a 30 day or 90 day supply? 90  Pt wants to f/u with Dr. Court, scheduled for 7/29

## 2023-07-26 NOTE — Telephone Encounter (Signed)
 Pt's medication was sent to pt's pharmacy as requested. Confirmation received.

## 2023-08-15 ENCOUNTER — Encounter: Payer: Self-pay | Admitting: Cardiovascular Disease

## 2023-08-15 ENCOUNTER — Ambulatory Visit: Attending: Internal Medicine | Admitting: Cardiovascular Disease

## 2023-08-15 VITALS — BP 128/70 | HR 60 | Ht 61.0 in | Wt 106.6 lb

## 2023-08-15 DIAGNOSIS — I10 Essential (primary) hypertension: Secondary | ICD-10-CM | POA: Diagnosis not present

## 2023-08-15 DIAGNOSIS — I471 Supraventricular tachycardia, unspecified: Secondary | ICD-10-CM | POA: Diagnosis not present

## 2023-08-15 DIAGNOSIS — F17200 Nicotine dependence, unspecified, uncomplicated: Secondary | ICD-10-CM

## 2023-08-15 DIAGNOSIS — E7800 Pure hypercholesterolemia, unspecified: Secondary | ICD-10-CM

## 2023-08-15 NOTE — Assessment & Plan Note (Signed)
 History of periodic SVT treated with as needed metoprolol .  This occurs 2-3 times a week.

## 2023-08-15 NOTE — Patient Instructions (Signed)

## 2023-08-15 NOTE — Assessment & Plan Note (Signed)
 History of essential hypertension blood pressure measured today 128/70.  She is on Micardis 

## 2023-08-15 NOTE — Assessment & Plan Note (Signed)
 History of remote right carotid endarterectomy by Dr. JINNY CHARM Collum followed by vascular surgery.

## 2023-08-15 NOTE — Progress Notes (Signed)
 08/15/2023 Vicki Hernandez   March 26, 1928  [id number]  Primary Physician Okey Carlin Redbird, MD Primary Cardiologist: Dorn JINNY Lesches MD [facility] [place], [place]  HPI:  Vicki Hernandez is a 89 y.o. thin and frail appearing widowed Caucasian female mother of 2, grandmother of 5 grandchildren who is accompanied by her daughter-in-law Vicki Hernandez today.  She is previously a patient of Dr. Dann.  I am assuming her care.  Unfortunately, her husband passed away 2 years ago.  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  Risk factors include 30 pack years of tobacco abuse having quit 8 years ago.  She has treated hypertension and untreated hyperlipidemia.  Her father died of a heart attack at age 20.  She never had heart attack or stroke.  Over the last several years she has noticed chest pain in the afternoon of unclear etiology however given her age and the fact that she does not wish an invasive evaluation no further workup will be necessary.   Current Meds  Medication Sig   acetaminophen  (TYLENOL ) 325 MG tablet Take 650 mg by mouth every 4 (four) hours as needed.   albuterol  (PROVENTIL ) (2.5 MG/3ML) 0.083% nebulizer solution Take 3 mLs (2.5 mg total) by nebulization every 2 (two) hours as needed for wheezing or shortness of breath.   albuterol  (VENTOLIN  HFA) 108 (90 Base) MCG/ACT inhaler Inhale 2 puffs into the lungs every 4 (four) hours as needed for wheezing or shortness of breath.   artificial tears (LACRILUBE) OINT ophthalmic ointment Place into both eyes daily as needed for dry eyes.   aspirin  EC 81 MG tablet Take 1 tablet (81 mg total) by mouth daily.   bisacodyl  (DULCOLAX) 10 MG suppository Place 1 suppository (10 mg total) rectally every 12 (twelve) hours as needed for mild constipation or moderate constipation.   Budeson-Glycopyrrol-Formoterol  (BREZTRI  AEROSPHERE) 160-9-4.8 MCG/ACT AERO Inhale 2 puffs into the lungs daily as needed (for shortness of breath).    Cyanocobalamin  (VITAMIN B12) 3000 MCG SUBL Take 3,000 mcg by mouth daily.   Dextromethorphan-guaiFENesin  (MUCINEX  COUGH & CHEST CONGEST PO) Take 1 tablet by mouth daily at 6 (six) AM.   diclofenac  Sodium (VOLTAREN ) 1 % GEL Apply 2 g topically 4 (four) times daily.   esomeprazole  (NEXIUM ) 20 MG capsule Take 1 capsule (20 mg total) by mouth 2 (two) times daily.   fluticasone  (FLONASE ALLERGY RELIEF) 50 MCG/ACT nasal spray Place 1 spray into both nostrils daily.   lactose free nutrition (BOOST) LIQD Give 1 can by mouth two times a day related to ABNORMAL WEIGHT LOSS   lidocaine  (LIDODERM ) 5 % Place 2 patches onto the skin daily. Apply 2 patches total one to Right & Left rib daily.   metoprolol  tartrate (LOPRESSOR ) 25 MG tablet Take 1/2 tablet by mouth twice daily, may take 1/2 tablet extra as needed for elevated heart rate.   nitroGLYCERIN  (NITROSTAT ) 0.4 MG SL tablet Place 1 tablet (0.4 mg total) under the tongue every 5 (five) minutes as needed for chest pain.   ondansetron  (ZOFRAN ) 4 MG tablet Take 1 tablet (4 mg total) by mouth every 8 (eight) hours as needed for nausea or vomiting.   oxyCODONE  (OXY IR/ROXICODONE ) 5 MG immediate release tablet Take 0.5-1 tablets (2.5-5 mg total) by mouth every 4 (four) hours as needed for moderate pain or severe pain (2.5mg  moderate, 5mg  severe).   penicillin v potassium (VEETID) 500 MG tablet Take 500 mg by mouth 4 (four) times daily.  polyethylene glycol (MIRALAX  / GLYCOLAX ) packet Take 17 g by mouth daily.   rosuvastatin  (CRESTOR ) 20 MG tablet Take 1 tablet (20 mg total) by mouth daily.   telmisartan  (MICARDIS ) 20 MG tablet Take 20 mg by mouth daily.   VITAMIN D, ERGOCALCIFEROL, PO Take 1 capsule by mouth every morning.      Allergies  Allergen Reactions   Sulfa Antibiotics Itching and Hives   Codeine Nausea Only   Cymbalta [Duloxetine Hcl]     Unknown reaction   Fosamax [Alendronate]     Other reaction(s): upset hiatal hernia   Prevacid [Lansoprazole]      Social History   Socioeconomic History   Marital status: Married    Spouse name: Not on file   Number of children: Not on file   Years of education: Not on file   Highest education level: Not on file  Occupational History   Not on file  Tobacco Use   Smoking status: Former    Current packs/day: 0.00    Types: Cigarettes    Quit date: 12/28/2015    Years since quitting: 7.6   Smokeless tobacco: Never  Vaping Use   Vaping status: Never Used  Substance and Sexual Activity   Alcohol use: Yes    Comment: social   Drug use: No   Sexual activity: Not on file  Other Topics Concern   Not on file  Social History Narrative   Not on file   Social Drivers of Health   Financial Resource Strain: Not on file  Food Insecurity: Not on file  Transportation Needs: Not on file  Physical Activity: Not on file  Stress: Not on file  Social Connections: Not on file  Intimate Partner Violence: Not on file     Review of Systems: General: negative for chills, fever, night sweats or weight changes.  Cardiovascular: negative for chest pain, dyspnea on exertion, edema, orthopnea, palpitations, paroxysmal nocturnal dyspnea or shortness of breath Dermatological: negative for rash Respiratory: negative for cough or wheezing Urologic: negative for hematuria Abdominal: negative for nausea, vomiting, diarrhea, bright red blood per rectum, melena, or hematemesis Neurologic: negative for visual changes, syncope, or dizziness All other systems reviewed and are otherwise negative except as noted above.    Blood pressure 128/70, pulse 60, height 5' 1 (1.549 m), weight 106 lb 9.6 oz (48.4 kg), SpO2 96%.  General appearance: alert and no distress Neck: no adenopathy, no carotid bruit, no JVD, supple, symmetrical, trachea midline, and thyroid  not enlarged, symmetric, no tenderness/mass/nodules Lungs: clear to auscultation bilaterally Heart: regular rate and rhythm, S1, S2 normal, no murmur, click,  rub or gallop Extremities: extremities normal, atraumatic, no cyanosis or edema Pulses: 2+ and symmetric Skin: Skin color, texture, turgor normal. No rashes or lesions Neurologic: Grossly normal  EKG EKG Interpretation Date/Time:  Tuesday August 15 2023 15:38:03 EDT Ventricular Rate:  60 PR Interval:  176 QRS Duration:  82 QT Interval:  450 QTC Calculation: 450 R Axis:   59  Text Interpretation: Normal sinus rhythm Left ventricular hypertrophy with repolarization abnormality ( Sokolow-Lyon ) Cannot rule out Septal infarct , age undetermined When compared with ECG of 24-Aug-2021 11:47, Premature ventricular complexes are no longer Present Vent. rate has decreased BY  44 BPM Minimal criteria for Septal infarct are now Present ST no longer depressed in Anterior leads Confirmed by Court Carrier [phone]) on 08/15/2023 3:46:20 PM    ASSESSMENT AND PLAN:   Occlusion and stenosis of carotid artery History of remote right  carotid endarterectomy by Dr. JINNY CHARM Collum followed by vascular surgery.  SVT (supraventricular tachycardia) (HCC) History of periodic SVT treated with as needed metoprolol .  This occurs 2-3 times a week.  Essential hypertension, benign History of essential hypertension blood pressure measured today 128/70.  She is on Micardis    Tobacco use disorder Discontinued tobacco use 8 years ago after smoking 30 pack years.  Pure hypercholesterolemia History of hyperlipidemia currently not on statin therapy with lipid profile performed 12/08/2022 revealing a total cholesterol 228, LDL 147 and HDL of 61.  Given her age I do not feel compelled to restart lipid-lowering therapy.     Dorn DOROTHA Lesches MD FACP,FACC,FAHA, [facility] 08/15/2023 3:59 PM

## 2023-08-15 NOTE — Assessment & Plan Note (Signed)
 History of hyperlipidemia currently not on statin therapy with lipid profile performed 12/08/2022 revealing a total cholesterol 228, LDL 147 and HDL of 61.  Given her age I do not feel compelled to restart lipid-lowering therapy.

## 2023-08-15 NOTE — Assessment & Plan Note (Signed)
 Discontinued tobacco use 8 years ago after smoking 30 pack years.

## 2023-08-31 DIAGNOSIS — H53002 Unspecified amblyopia, left eye: Secondary | ICD-10-CM | POA: Diagnosis not present

## 2023-08-31 DIAGNOSIS — H04123 Dry eye syndrome of bilateral lacrimal glands: Secondary | ICD-10-CM | POA: Diagnosis not present

## 2023-08-31 DIAGNOSIS — H353231 Exudative age-related macular degeneration, bilateral, with active choroidal neovascularization: Secondary | ICD-10-CM | POA: Diagnosis not present

## 2023-08-31 DIAGNOSIS — Z961 Presence of intraocular lens: Secondary | ICD-10-CM | POA: Diagnosis not present

## 2023-10-17 ENCOUNTER — Other Ambulatory Visit: Payer: Self-pay | Admitting: Cardiovascular Disease

## 2023-10-17 MED ORDER — METOPROLOL TARTRATE 25 MG PO TABS: ORAL_TABLET | ORAL | 3 refills | Status: AC

## 2024-02-13 ENCOUNTER — Encounter (INDEPENDENT_AMBULATORY_CARE_PROVIDER_SITE_OTHER): Payer: Self-pay | Admitting: Physician Assistant

## 2024-02-13 ENCOUNTER — Ambulatory Visit (INDEPENDENT_AMBULATORY_CARE_PROVIDER_SITE_OTHER): Admitting: Physician Assistant

## 2024-02-13 VITALS — BP 139/73 | HR 67 | Temp 97.9°F | Ht 60.0 in | Wt 106.0 lb

## 2024-02-13 DIAGNOSIS — H6123 Impacted cerumen, bilateral: Secondary | ICD-10-CM | POA: Diagnosis not present

## 2024-02-13 NOTE — Progress Notes (Signed)
Patient did not take BP medication this morning. 

## 2024-02-13 NOTE — Progress Notes (Signed)
 Dear Dr. Okey, Here is my assessment for our mutual patient, Vicki Hernandez. Thank you for allowing me the opportunity to care for your patient. Please do not hesitate to contact me should you have any other questions. Sincerely, Chyrl Cohen PA-C  Otolaryngology Clinic Note Referring provider: Dr. Okey HPI:  Vicki Hernandez is a 89 y.o. female kindly referred by Dr. Okey   Discussed the use of AI scribe software for clinical note transcription with the patient, who gave verbal consent to proceed.  History of Present Illness    Vicki Hernandez is a 89 year old female with longstanding hearing loss who presents for evaluation of decreased hearing and bilateral cerumen impaction.  She has experienced progressive bilateral hearing loss for many years, with onset prior to her eighties. She has used hearing aids for an extended period and obtained new devices last year, but is unable to hear with the new aids and continues to rely on her older devices. She expresses frustration with the cost and ineffectiveness of the newer hearing aids. Hearing impairment has led her to increase the volume on her television to a level that others cannot hear, though her living situation minimizes disturbance to neighbors.  She has previously undergone ear cleaning procedures and has attempted peroxide instillation in both ears with variable success. She has previously attempted peroxide instillation in both ears with variable success. She is familiar with ear cleaning procedures and has undergone them previously. She denies prior ear surgeries or trauma. No other otologic symptoms are reported.           Independent Review of Additional Tests or Records:  none   PMH/Meds/All/SocHx/FamHx/ROS:   Past Medical History:  Diagnosis Date   Accessory kidney    Angina    3 WEEKS   Arthritis    OSTEO    Atrial fibrillation (HCC) 2014   Carotid artery bruit    Complication of anesthesia    TAKES AWHILE  TO WAKE UP    Dysrhythmia    TACHYCARDIA    GERD (gastroesophageal reflux disease)    H/O hiatal hernia    Heart murmur    Hyperlipidemia    Hypertension    Macular degeneration      Past Surgical History:  Procedure Laterality Date   ABDOMINAL HYSTERECTOMY  1974   TAH w/ BSO   APPENDECTOMY     CARDIOVASCULAR STRESS TEST     5 YRS AGO     DR H SMITH  (NOW SEES DR DANN)   CAROTID ENDARTERECTOMY Left 03-25-11   cea   CATARACT EXTRACTION W/ INTRAOCULAR LENS  IMPLANT, BILATERAL     ENDARTERECTOMY  03/25/2011   Procedure: ENDARTERECTOMY CAROTID;  Surgeon: Lynwood JONETTA Collum, MD;  Location: [facility] OR;  Service: Vascular;  Laterality: Left;  Left Carotid Endarterectomy with dacron patch angioplasty, with resection of redundant common carotid with primary reanastamosis   EYE SURGERY     HEMORRHOID SURGERY     JOINT REPLACEMENT  2012   Left Knee   KNEE SURGERY     SPINE SURGERY  2002, 2010   Lumbar disk/ spine surgeries X 2   By Dr. Burnetta   TONSILLECTOMY      Family History  Problem Relation Age of Onset   Hyperlipidemia Father    Hypertension Father    Heart disease Father        Heart Disease before age 60-  Bleeding problems   Heart attack Father    Stroke Mother  Heart disease Mother        After age 60     Social Connections: Not on file     Current Medications[1]   Physical Exam:   BP 139/73   Pulse 67   Temp 97.9 F (36.6 C)   Ht 5' (1.524 m)   Wt 106 lb (48.1 kg)   SpO2 95%   BMI 20.70 kg/m   Pertinent Findings  CN II-XII grossly intact Cerumen impaction bilateral  Anterior rhinoscopy: Septum midline; bilateral inferior turbinates with no hypertrophy No lesions of oral cavity/oropharynx; dentition WNL No obviously palpable neck masses/lymphadenopathy/thyromegaly No respiratory distress or stridor  Seprately Identifiable Procedures:  Procedure: Bilateral ear microscopy and cerumen removal using microscope (CPT [phone]) - Mod 50 Pre-procedure diagnosis:  bilateral cerumen impaction external auditory canals Post-procedure diagnosis: same Indication: bilateral cerumen impaction; given patient's otologic complaints and history as well as for improved and comprehensive examination of external ear and tympanic membrane, bilateral otologic examination using microscope was performed and impacted cerumen removed  Procedure: Patient was placed semi-recumbent. Both ear canals were examined using the microscope with findings above. Cerumen removed from bilateral external auditory canals using suction and currette with improvement in EAC examination and patency. Left: EAC was patent. TM was intact . Middle ear was aerated. Drainage: none Right: EAC was patent. TM was intact . Middle ear was aerated . Drainage: none Patient tolerated the procedure well.   Impression & Plans:  Vicki Hernandez is a 89 y.o. female with the following   Assessment and Plan    Impacted cerumen, bilateral Bilateral cerumen impaction. Removal needed for better hearing and hearing aid use. - Recommended follow-up in six months for otoscopic exam and cerumen management.  Hearing loss Hearing loss managed with hearing aids. Possible device malfunction or wax obstruction affecting new hearing aids. - Advised professional cleaning of hearing aids next week. - Recommended bringing both old and new hearing aids to the cleaning appointment.           - f/u 6 months    Thank you for allowing me the opportunity to care for your patient. Please do not hesitate to contact me should you have any other questions.  Sincerely, Chyrl Cohen PA-C [facility] Phone: [phone] Fax: [phone]  02/13/2024, 12:30 PM        [1]  Current Outpatient Medications:    acetaminophen  (TYLENOL ) 325 MG tablet, Take 650 mg by mouth every 4 (four) hours as needed., Disp: , Rfl:    albuterol  (PROVENTIL ) (2.5 MG/3ML) 0.083% nebulizer solution, Take 3 mLs (2.5 mg total) by  nebulization every 2 (two) hours as needed for wheezing or shortness of breath., Disp: 3 mL, Rfl: 0   albuterol  (VENTOLIN  HFA) 108 (90 Base) MCG/ACT inhaler, Inhale 2 puffs into the lungs every 4 (four) hours as needed for wheezing or shortness of breath., Disp: , Rfl:    artificial tears (LACRILUBE) OINT ophthalmic ointment, Place into both eyes daily as needed for dry eyes., Disp: , Rfl:    aspirin  EC 81 MG tablet, Take 1 tablet (81 mg total) by mouth daily., Disp: 90 tablet, Rfl: 3   bisacodyl  (DULCOLAX) 10 MG suppository, Place 1 suppository (10 mg total) rectally every 12 (twelve) hours as needed for mild constipation or moderate constipation., Disp: 12 suppository, Rfl: 0   Budeson-Glycopyrrol-Formoterol  (BREZTRI  AEROSPHERE) 160-9-4.8 MCG/ACT AERO, Inhale 2 puffs into the lungs daily as needed (for shortness of breath)., Disp: 10.7 g, Rfl: 0   Cyanocobalamin  (  VITAMIN B12) 3000 MCG SUBL, Take 3,000 mcg by mouth daily., Disp: , Rfl:    Dextromethorphan-guaiFENesin  (MUCINEX  COUGH & CHEST CONGEST PO), Take 1 tablet by mouth daily at 6 (six) AM., Disp: , Rfl:    diclofenac  Sodium (VOLTAREN ) 1 % GEL, Apply 2 g topically 4 (four) times daily., Disp: , Rfl:    esomeprazole  (NEXIUM ) 20 MG capsule, Take 1 capsule (20 mg total) by mouth 2 (two) times daily., Disp: 60 capsule, Rfl: 0   fluticasone  (FLONASE ALLERGY RELIEF) 50 MCG/ACT nasal spray, Place 1 spray into both nostrils daily., Disp: , Rfl:    lactose free nutrition (BOOST) LIQD, Give 1 can by mouth two times a day related to ABNORMAL WEIGHT LOSS, Disp: , Rfl:    lidocaine  (LIDODERM ) 5 %, Place 2 patches onto the skin daily. Apply 2 patches total one to Right & Left rib daily., Disp: , Rfl:    metoprolol  tartrate (LOPRESSOR ) 25 MG tablet, Take 1/2 tablet by mouth twice daily, may take 1/2 tablet extra as needed for elevated heart rate., Disp: 45 tablet, Rfl: 3   nitroGLYCERIN  (NITROSTAT ) 0.4 MG SL tablet, Place 1 tablet (0.4 mg total) under the  tongue every 5 (five) minutes as needed for chest pain., Disp: 25 tablet, Rfl: 6   ondansetron  (ZOFRAN ) 4 MG tablet, Take 1 tablet (4 mg total) by mouth every 8 (eight) hours as needed for nausea or vomiting., Disp: 20 tablet, Rfl: 0   oxyCODONE  (OXY IR/ROXICODONE ) 5 MG immediate release tablet, Take 0.5-1 tablets (2.5-5 mg total) by mouth every 4 (four) hours as needed for moderate pain or severe pain (2.5mg  moderate, 5mg  severe)., Disp: 5 tablet, Rfl: 0   penicillin v potassium (VEETID) 500 MG tablet, Take 500 mg by mouth 4 (four) times daily., Disp: , Rfl:    polyethylene glycol (MIRALAX  / GLYCOLAX ) packet, Take 17 g by mouth daily., Disp: , Rfl:    rosuvastatin  (CRESTOR ) 20 MG tablet, Take 1 tablet (20 mg total) by mouth daily., Disp: 90 tablet, Rfl: 3   telmisartan  (MICARDIS ) 20 MG tablet, Take 20 mg by mouth daily., Disp: , Rfl:    VITAMIN D, ERGOCALCIFEROL, PO, Take 1 capsule by mouth every morning. , Disp: , Rfl:    nitrofurantoin, macrocrystal-monohydrate, (MACROBID) 100 MG capsule, Take 100 mg by mouth every 12 (twelve) hours. (Patient not taking: Reported on 02/13/2024), Disp: , Rfl:    telmisartan  (MICARDIS ) 40 MG tablet, Take 1 tablet (40 mg total) by mouth daily. (Patient not taking: Reported on 02/13/2024), Disp: 30 tablet, Rfl: 0

## 2024-08-13 ENCOUNTER — Ambulatory Visit (INDEPENDENT_AMBULATORY_CARE_PROVIDER_SITE_OTHER): Admitting: Physician Assistant
